# Patient Record
Sex: Female | Born: 1940 | Race: White | Hispanic: No | Marital: Married | Smoking: Never smoker
Health system: Southern US, Community
[De-identification: ages and names within clinical notes are randomized; demographics above are authoritative.]

## PROBLEM LIST (undated history)

## (undated) DIAGNOSIS — F329 Major depressive disorder, single episode, unspecified: Secondary | ICD-10-CM

## (undated) DIAGNOSIS — R5382 Chronic fatigue, unspecified: Secondary | ICD-10-CM

## (undated) DIAGNOSIS — G9332 Myalgic encephalomyelitis/chronic fatigue syndrome: Secondary | ICD-10-CM

## (undated) DIAGNOSIS — G4700 Insomnia, unspecified: Secondary | ICD-10-CM

## (undated) DIAGNOSIS — F32A Depression, unspecified: Secondary | ICD-10-CM

## (undated) DIAGNOSIS — N189 Chronic kidney disease, unspecified: Secondary | ICD-10-CM

## (undated) DIAGNOSIS — M1990 Unspecified osteoarthritis, unspecified site: Secondary | ICD-10-CM

## (undated) DIAGNOSIS — M810 Age-related osteoporosis without current pathological fracture: Secondary | ICD-10-CM

## (undated) DIAGNOSIS — M797 Fibromyalgia: Secondary | ICD-10-CM

## (undated) HISTORY — PX: BUNIONECTOMY: SHX129

## (undated) HISTORY — PX: TONSILLECTOMY: SUR1361

## (undated) HISTORY — DX: Depression, unspecified: F32.A

## (undated) HISTORY — PX: HERNIA REPAIR: SHX51

## (undated) HISTORY — DX: Myalgic encephalomyelitis/chronic fatigue syndrome: G93.32

## (undated) HISTORY — DX: Major depressive disorder, single episode, unspecified: F32.9

## (undated) HISTORY — DX: Unspecified osteoarthritis, unspecified site: M19.90

## (undated) HISTORY — DX: Fibromyalgia: R53.82

## (undated) HISTORY — DX: Insomnia, unspecified: G47.00

## (undated) HISTORY — DX: Chronic kidney disease, unspecified: N18.9

## (undated) HISTORY — DX: Myalgic encephalomyelitis/chronic fatigue syndrome: M79.7

## (undated) HISTORY — DX: Age-related osteoporosis without current pathological fracture: M81.0

---

## 1997-08-14 ENCOUNTER — Ambulatory Visit (HOSPITAL_COMMUNITY): Admission: RE | Admit: 1997-08-14 | Discharge: 1997-08-14 | Payer: Self-pay | Admitting: Obstetrics and Gynecology

## 1998-08-10 ENCOUNTER — Other Ambulatory Visit: Admission: RE | Admit: 1998-08-10 | Discharge: 1998-08-10 | Payer: Self-pay | Admitting: Obstetrics and Gynecology

## 1998-09-13 ENCOUNTER — Ambulatory Visit (HOSPITAL_COMMUNITY): Admission: RE | Admit: 1998-09-13 | Discharge: 1998-09-13 | Payer: Self-pay | Admitting: Obstetrics and Gynecology

## 1998-09-13 ENCOUNTER — Encounter: Payer: Self-pay | Admitting: Obstetrics and Gynecology

## 1999-10-10 ENCOUNTER — Encounter: Payer: Self-pay | Admitting: Obstetrics and Gynecology

## 1999-10-10 ENCOUNTER — Ambulatory Visit (HOSPITAL_COMMUNITY): Admission: RE | Admit: 1999-10-10 | Discharge: 1999-10-10 | Payer: Self-pay | Admitting: Obstetrics and Gynecology

## 2000-08-01 ENCOUNTER — Other Ambulatory Visit: Admission: RE | Admit: 2000-08-01 | Discharge: 2000-08-01 | Payer: Self-pay | Admitting: Obstetrics and Gynecology

## 2001-05-06 ENCOUNTER — Encounter: Payer: Self-pay | Admitting: Obstetrics and Gynecology

## 2001-05-06 ENCOUNTER — Ambulatory Visit (HOSPITAL_COMMUNITY): Admission: RE | Admit: 2001-05-06 | Discharge: 2001-05-06 | Payer: Self-pay | Admitting: Obstetrics and Gynecology

## 2002-09-26 ENCOUNTER — Ambulatory Visit (HOSPITAL_COMMUNITY): Admission: RE | Admit: 2002-09-26 | Discharge: 2002-09-26 | Payer: Self-pay | Admitting: Internal Medicine

## 2002-09-26 ENCOUNTER — Encounter: Payer: Self-pay | Admitting: Internal Medicine

## 2003-11-26 ENCOUNTER — Ambulatory Visit (HOSPITAL_COMMUNITY): Admission: RE | Admit: 2003-11-26 | Discharge: 2003-11-26 | Payer: Self-pay | Admitting: Obstetrics and Gynecology

## 2004-05-17 ENCOUNTER — Ambulatory Visit: Payer: Self-pay | Admitting: Internal Medicine

## 2004-06-17 ENCOUNTER — Ambulatory Visit: Payer: Self-pay | Admitting: Internal Medicine

## 2004-07-29 ENCOUNTER — Ambulatory Visit: Payer: Self-pay | Admitting: Internal Medicine

## 2004-09-15 ENCOUNTER — Ambulatory Visit: Payer: Self-pay | Admitting: Internal Medicine

## 2004-12-26 ENCOUNTER — Ambulatory Visit: Payer: Self-pay | Admitting: Internal Medicine

## 2005-01-17 ENCOUNTER — Ambulatory Visit: Payer: Self-pay | Admitting: Internal Medicine

## 2005-01-19 ENCOUNTER — Ambulatory Visit: Payer: Self-pay | Admitting: Internal Medicine

## 2005-01-23 ENCOUNTER — Ambulatory Visit: Payer: Self-pay | Admitting: Internal Medicine

## 2005-02-06 ENCOUNTER — Ambulatory Visit: Payer: Self-pay | Admitting: Internal Medicine

## 2005-02-06 ENCOUNTER — Ambulatory Visit (HOSPITAL_COMMUNITY): Admission: RE | Admit: 2005-02-06 | Discharge: 2005-02-06 | Payer: Self-pay | Admitting: Obstetrics and Gynecology

## 2005-02-17 ENCOUNTER — Ambulatory Visit: Payer: Self-pay | Admitting: Internal Medicine

## 2005-03-02 ENCOUNTER — Other Ambulatory Visit: Admission: RE | Admit: 2005-03-02 | Discharge: 2005-03-02 | Payer: Self-pay | Admitting: Obstetrics and Gynecology

## 2005-04-03 ENCOUNTER — Ambulatory Visit: Payer: Self-pay | Admitting: Internal Medicine

## 2005-11-22 ENCOUNTER — Ambulatory Visit: Payer: Self-pay | Admitting: Internal Medicine

## 2006-01-04 ENCOUNTER — Ambulatory Visit: Payer: Self-pay | Admitting: Internal Medicine

## 2006-03-23 ENCOUNTER — Other Ambulatory Visit: Admission: RE | Admit: 2006-03-23 | Discharge: 2006-03-23 | Payer: Self-pay | Admitting: Obstetrics and Gynecology

## 2006-04-12 ENCOUNTER — Ambulatory Visit (HOSPITAL_COMMUNITY): Admission: RE | Admit: 2006-04-12 | Discharge: 2006-04-12 | Payer: Self-pay | Admitting: Obstetrics and Gynecology

## 2006-04-12 ENCOUNTER — Ambulatory Visit: Payer: Self-pay | Admitting: Internal Medicine

## 2006-04-12 LAB — CONVERTED CEMR LAB
Albumin: 3.8 g/dL (ref 3.5–5.2)
Basophils Relative: 0.1 % (ref 0.0–1.0)
CO2: 28 meq/L (ref 19–32)
Chol/HDL Ratio, serum: 3.4
Creatinine, Ser: 1.1 mg/dL (ref 0.4–1.2)
Eosinophil percent: 2.9 % (ref 0.0–5.0)
GFR calc non Af Amer: 53 mL/min
Glomerular Filtration Rate, Af Am: 64 mL/min/{1.73_m2}
Glucose, Bld: 79 mg/dL (ref 70–99)
HDL: 70 mg/dL (ref >39.0)
Hemoglobin: 14.4 g/dL (ref 12.0–15.0)
MCHC: 33.8 g/dL (ref 30.0–36.0)
Monocytes Absolute: 0.3 10*3/uL (ref 0.2–0.7)
Monocytes Relative: 6.5 % (ref 3.0–11.0)
Potassium: 4.6 meq/L (ref 3.5–5.1)
RBC: 4.4 M/uL (ref 3.87–5.11)
RDW: 12.9 % (ref 11.5–14.6)
Sodium: 143 meq/L (ref 135–145)
TSH: 2.52 microintl units/mL (ref 0.35–5.50)
Total Bilirubin: 1 mg/dL (ref 0.3–1.2)
Triglyceride fasting, serum: 64 mg/dL (ref 0–149)
VLDL: 13 mg/dL (ref 0–40)

## 2006-05-08 ENCOUNTER — Ambulatory Visit: Payer: Self-pay | Admitting: Internal Medicine

## 2006-07-12 ENCOUNTER — Ambulatory Visit: Payer: Self-pay | Admitting: Internal Medicine

## 2006-08-27 ENCOUNTER — Ambulatory Visit: Payer: Self-pay | Admitting: Internal Medicine

## 2006-11-19 ENCOUNTER — Ambulatory Visit (HOSPITAL_COMMUNITY): Admission: RE | Admit: 2006-11-19 | Discharge: 2006-11-19 | Payer: Self-pay | Admitting: Gastroenterology

## 2007-03-12 DIAGNOSIS — F32A Depression, unspecified: Secondary | ICD-10-CM | POA: Insufficient documentation

## 2007-03-12 DIAGNOSIS — F329 Major depressive disorder, single episode, unspecified: Secondary | ICD-10-CM | POA: Insufficient documentation

## 2007-08-14 ENCOUNTER — Ambulatory Visit (HOSPITAL_COMMUNITY): Admission: RE | Admit: 2007-08-14 | Discharge: 2007-08-14 | Payer: Self-pay | Admitting: Surgery

## 2007-11-05 ENCOUNTER — Other Ambulatory Visit: Admission: RE | Admit: 2007-11-05 | Discharge: 2007-11-05 | Payer: Self-pay | Admitting: Family Medicine

## 2008-04-07 ENCOUNTER — Ambulatory Visit: Payer: Self-pay | Admitting: Internal Medicine

## 2009-05-07 ENCOUNTER — Ambulatory Visit: Payer: Self-pay | Admitting: Internal Medicine

## 2010-03-17 ENCOUNTER — Ambulatory Visit: Payer: Self-pay | Admitting: Internal Medicine

## 2010-08-04 NOTE — Assessment & Plan Note (Signed)
Summary: FLU SHOT/CB  Nurse Visit   Review of Systems       Flu Vaccine Consent Questions     Do you have a history of severe allergic reactions to this vaccine? no    Any prior history of allergic reactions to egg and/or gelatin? no    Do you have a sensitivity to the preservative Thimersol? no    Do you have a past history of Guillan-Barre Syndrome? no    Do you currently have an acute febrile illness? no    Have you ever had a severe reaction to latex? no    Vaccine information given and explained to patient? yes    Are you currently pregnant? no    Lot Number:[id number]   Exp Date:12/31/2010   Site Given  Left Deltoid IM Josph Macho RMA  March 17, 2010 3:46 PM    Allergies: No Known Drug Allergies  Orders Added: 1)  Flu Vaccine 95yrs + MEDICARE PATIENTS [Q2039] 2)  Administration Flu vaccine - MCR [G0008]

## 2010-08-16 ENCOUNTER — Encounter: Payer: Self-pay | Admitting: Internal Medicine

## 2010-08-16 ENCOUNTER — Ambulatory Visit (INDEPENDENT_AMBULATORY_CARE_PROVIDER_SITE_OTHER): Payer: PRIVATE HEALTH INSURANCE | Admitting: Internal Medicine

## 2010-08-16 DIAGNOSIS — F329 Major depressive disorder, single episode, unspecified: Secondary | ICD-10-CM

## 2010-08-16 DIAGNOSIS — M5412 Radiculopathy, cervical region: Secondary | ICD-10-CM

## 2010-08-16 MED ORDER — METHYLPREDNISOLONE ACETATE 80 MG/ML IJ SUSP
80.0000 mg | Freq: Once | INTRAMUSCULAR | Status: AC
  Administered 2010-08-16: 80 mg via INTRAMUSCULAR

## 2010-08-16 NOTE — Progress Notes (Signed)
  Subjective:    Patient ID: Dawn Harmon, female    DOB: [birth year], 70 y.o.   MRN: [id number]  HPI  70 year old patient who presents with a 3-day history of right shoulder discomfort.  There's been no trauma or injury.  No constitutional symptoms.  She has also noticed some paresthesias involving the right arm affecting the third, fourth, and fifth fingers of the right hand.  She states that when she was young.  She did have a right shoulder dislocation.  Symptoms do not seem to be aggravated by movement of the head or neck    Review of Systems  Constitutional: Negative.   HENT: Negative for hearing loss, congestion, sore throat, rhinorrhea, dental problem, sinus pressure and tinnitus.   Eyes: Negative for pain, discharge and visual disturbance.  Respiratory: Negative for cough and shortness of breath.   Cardiovascular: Negative for chest pain, palpitations and leg swelling.  Gastrointestinal: Negative for nausea, vomiting, abdominal pain, diarrhea, constipation, blood in stool and abdominal distention.  Genitourinary: Negative for dysuria, urgency, frequency, hematuria, flank pain, vaginal bleeding, vaginal discharge, difficulty urinating, vaginal pain and pelvic pain.  Musculoskeletal: Negative for joint swelling, arthralgias and gait problem.  Skin: Negative for rash.  Neurological: Negative for dizziness, syncope, speech difficulty, weakness, numbness and headaches.  Hematological: Negative for adenopathy.  Psychiatric/Behavioral: Negative for behavioral problems, dysphoric mood and agitation. The patient is not nervous/anxious.        Objective:   Physical Exam  Constitutional: She is oriented to person, place, and time. She appears well-developed and well-nourished. No distress.  Musculoskeletal: Normal range of motion. She exhibits no edema and no tenderness.       Range of motion of the right shoulder and neck intact.  Maneuvers do not tend to aggravate pain or paresthesias    Neurological: She is alert and oriented to person, place, and time. She has normal reflexes. She displays normal reflexes. No cranial nerve deficit. Coordination normal.       Biceps and biceps reflexes were normal Motor strength intact Left grip strength is  slightly stronger ( dominant hand)          Assessment & Plan:   probable mild cervical radiculopathy.  Normal neurologic examination.  Will treat with 80 mg of Depo-Medrol and observed Impression stable will continue present regimen

## 2010-08-16 NOTE — Patient Instructions (Signed)
Call or return to clinic prn if these symptoms worsen or fail to improve as anticipated.

## 2010-11-15 NOTE — Op Note (Signed)
NAMERAVEN, FURNAS                  ACCOUNT NO.:  [id number]   MEDICAL RECORD NO.:  [id number]          PATIENT TYPE:  AMB   LOCATION:  DAY                          FACILITY:  [facility]   PHYSICIAN:  Thornton Park. Daphine Deutscher, MD  DATE OF BIRTH:  May 27, 1941   DATE OF PROCEDURE:  08/14/2007  DATE OF DISCHARGE:                               OPERATIVE REPORT   PREOPERATIVE DIAGNOSIS:  Left inguinal hernia.   POSTOPERATIVE DIAGNOSIS:  Left indirect inguinal hernia.   PROCEDURE:  Left inguinal herniorrhaphy with mesh, open.   SURGEON:  Thornton Park. Daphine Deutscher, M.D.   ASSISTANT:  None.   ANESTHESIA:  General.   DESCRIPTION OF PROCEDURE:  Ms. Welling was taken to room 1 at [facility]  on August 14, 2007, and given general anesthesia.  The abdomen was  prepped with TechniCare and draped sterilely.  I had previously marked  the area on the left side in the upright position and I made a  transverse incision over this area where I thought the internal ring  was.  I carried this down opening the external oblique and mobilized  about a 3-4 cm in diameter hernia sac.  This was then reduced into the  abdomen after feeling of it carefully and then felt like I did not want  to excise it since it may have sliding components.  I closed the  external oblique with interrupted 2-0 Prolenes and reinforced this area  with a piece of Marlex mesh cut and then sewn in with interrupted  sutures.  The gubernaculum had been transected.  The external oblique  was then closed over this and then the skin was closed 4-0 Vicryl,  Benzoin, and Steri-Strips.  The patient tolerated the procedure well.  She will be given Vicodin to take for pain and be followed up in the  office in 3-4 weeks.      Thornton Park Daphine Deutscher, MD  Electronically Signed     MBM/MEDQ  D:  08/14/2007  T:  08/15/2007  Job:  [id number]   cc:   Stacie Acres. Cliffton Asters, M.D.  Fax: [phone]   Courtney Paris, M.D.  Fax: [phone]

## 2010-11-18 NOTE — Assessment & Plan Note (Signed)
[facility] OFFICE NOTE   LENZI, MARMO                         MRN:          [id number]  DATE:05/08/2006                            DOB:          1940-07-17    A 70 year old female seen today for an annual exam.  She is followed by  gynecology.  She has a history of chronic fatigue and fibromyalgia.  Her  main complaint is chronic insomnia.  She is gravida 2, para 2, abortus 1,  has had a remote tonsillectomy.  Presently on no medications.   FAMILY HISTORY:  Father died at 65 with a history of depression.  Mother  died at 53 of breast cancer, a sister also has breast cancer.   PHYSICAL EXAMINATION:  GENERAL:  Reveals a healthy-appearing fit female in  no acute distress.  VITAL SIGNS:  Blood pressure was low normal.  SKIN:  Negative.  FUNDI, EARS, NOSE AND THROAT:  Clear.  NECK:  No adenopathy or bruits, no thyroid enlargement.  CHEST:  Clear.  BREASTS:  Negative.  CARDIOVASCULAR:  Normal heart sounds without murmurs or clicks.  ABDOMEN:  Soft and nontender, no organomegaly or bruits.  EXTREMITIES:  Unremarkable.  Peripheral pulses intact.  NEUROLOGIC:  Negative.   IMPRESSION:  1. Chronic fatigue.  2. Fibromyalgia.  3. Insomnia.   DISPOSITION:  We will give her a trial of Rozerem, additionally was given a  prescription for Lunesta which has been helpful in the past.  Return here in  1 year or p.r.n.    ______________________________  Gordy Savers, MD    PFK/MedQ  DD: 05/08/2006  DT: 05/09/2006  Job #: [id number]

## 2010-11-18 NOTE — Assessment & Plan Note (Signed)
[facility]                                 ON-CALL NOTE   JALENE, DEMO                           MRN:          [id number]  DATE:08/25/2006                            DOB:          [birth year]    PHONE NUMBER: [phone].:  Called at 1:55 p.m. with a possible bladder infection.  Because we were  already overbooked and not able to fit her in at the Saturday clinic she  was advised to go to [facility] for further evaluation.     Karie Schwalbe, MD  Electronically Signed    RIL/MedQ  DD: 08/25/2006  DT: 08/25/2006  Job #: [id number]   cc:   Gordy Savers, MD

## 2010-12-29 ENCOUNTER — Telehealth: Payer: Self-pay | Admitting: Internal Medicine

## 2010-12-29 NOTE — Telephone Encounter (Signed)
Still having right arm pain/numbness. Went to ortho dr last month tests were done and that it was arthritis. Still hurting and wants to know what the next step is. Refused several times to come see Dr Kirtland Bouchard. Please advise.

## 2010-12-29 NOTE — Telephone Encounter (Signed)
Attempt to call - ans mach - LMTCB - informed of dr. Vernon Prey instruction to make appt here or with ortho. KIK

## 2010-12-29 NOTE — Telephone Encounter (Signed)
Suggest office follow up here or followup with orthopedics

## 2011-01-16 ENCOUNTER — Encounter: Payer: Self-pay | Admitting: Internal Medicine

## 2011-01-16 ENCOUNTER — Ambulatory Visit (INDEPENDENT_AMBULATORY_CARE_PROVIDER_SITE_OTHER): Payer: PRIVATE HEALTH INSURANCE | Admitting: Internal Medicine

## 2011-01-16 VITALS — BP 118/87 | Temp 97.9°F | Wt 157.0 lb

## 2011-01-16 DIAGNOSIS — F329 Major depressive disorder, single episode, unspecified: Secondary | ICD-10-CM

## 2011-01-16 DIAGNOSIS — N390 Urinary tract infection, site not specified: Secondary | ICD-10-CM

## 2011-01-16 LAB — POCT URINALYSIS DIPSTICK
Ketones, UA: NEGATIVE
Spec Grav, UA: 1.005
Urobilinogen, UA: 1

## 2011-01-16 MED ORDER — CIPROFLOXACIN HCL 500 MG PO TABS: 500.0000 mg | ORAL_TABLET | Freq: Two times a day (BID) | ORAL | Status: DC

## 2011-01-16 NOTE — Patient Instructions (Signed)
Force fluids Call or return to clinic prn if these symptoms worsen or fail to improve as anticipated.

## 2011-01-16 NOTE — Progress Notes (Signed)
  Subjective:    Patient ID: Dawn Harmon, female    DOB: April 06, 1941, 70 y.o.   MRN: [id number]  HPI 70 year old patient who presents with an abrupt onset over the past few hours of burning dysuria frequency and urgency. Urinalysis was reviewed and did reveal pyuria. No fever or chills no flank pain. Denies any drug allergies   Review of Systems  Genitourinary: Positive for dysuria and frequency.       Objective:   Physical Exam  Constitutional: She appears well-developed and well-nourished. No distress.          Assessment & Plan:  Acute cystitis. Will treat with Cipro

## 2011-01-19 ENCOUNTER — Encounter: Payer: Self-pay | Admitting: Internal Medicine

## 2011-01-19 ENCOUNTER — Ambulatory Visit (INDEPENDENT_AMBULATORY_CARE_PROVIDER_SITE_OTHER): Payer: PRIVATE HEALTH INSURANCE | Admitting: Internal Medicine

## 2011-01-19 VITALS — BP 110/76 | Temp 97.8°F | Wt 158.0 lb

## 2011-01-19 DIAGNOSIS — N390 Urinary tract infection, site not specified: Secondary | ICD-10-CM

## 2011-01-19 DIAGNOSIS — R350 Frequency of micturition: Secondary | ICD-10-CM

## 2011-01-19 LAB — POCT URINALYSIS DIPSTICK
Leukocytes, UA: NEGATIVE
Nitrite, UA: POSITIVE
Protein, UA: NEGATIVE
pH, UA: 5.5

## 2011-01-19 MED ORDER — NITROFURANTOIN MONOHYD MACRO 100 MG PO CAPS: 100.0000 mg | ORAL_CAPSULE | Freq: Two times a day (BID) | ORAL | Status: AC

## 2011-01-19 NOTE — Progress Notes (Signed)
  Subjective:    Patient ID: Dawn Harmon, female    DOB: Nov 27, 1940, 70 y.o.   MRN: [id number]  HPI  70 year old patient who was seen 4 days ago and treated for a UTI after the abrupt onset of urinary frequency and dysuria. She was placed on Cipro and last night developed urinary frequency and today some urgency and dysuria. She remains on Cipro twice daily. She has been taking Pyridium and symptoms are greatly improved no fever chills or flank pain     Review of Systems  Genitourinary: Positive for dysuria and frequency.       Objective:   Physical Exam  Constitutional: She appears well-developed and well-nourished. No distress.  Abdominal: Soft. Bowel sounds are normal.          Assessment & Plan:  Refractory UTI. Will treat with Macrobid 100 twice a day for 7 days. If symptoms do not improve we'll check a urine culture

## 2011-01-19 NOTE — Patient Instructions (Signed)
Drink as much fluid as you  can tolerate over the next few days  Discontinue Cipro  Take your antibiotic as prescribed until ALL of it is gone, but stop if you develop a rash, swelling, or any side effects of the medication.  Contact our office as soon as possible if  there are side effects of the medication.

## 2011-02-28 ENCOUNTER — Other Ambulatory Visit (INDEPENDENT_AMBULATORY_CARE_PROVIDER_SITE_OTHER): Payer: PRIVATE HEALTH INSURANCE

## 2011-02-28 DIAGNOSIS — Z79899 Other long term (current) drug therapy: Secondary | ICD-10-CM

## 2011-02-28 DIAGNOSIS — Z0000 Encounter for general adult medical examination without abnormal findings: Secondary | ICD-10-CM

## 2011-02-28 DIAGNOSIS — F329 Major depressive disorder, single episode, unspecified: Secondary | ICD-10-CM

## 2011-02-28 LAB — POCT URINALYSIS DIPSTICK
Bilirubin, UA: NEGATIVE
Nitrite, UA: NEGATIVE
Protein, UA: NEGATIVE
pH, UA: 7

## 2011-02-28 LAB — CBC WITH DIFFERENTIAL/PLATELET
Basophils Relative: 0.6 % (ref 0.0–3.0)
Eosinophils Absolute: 0.1 10*3/uL (ref 0.0–0.7)
Eosinophils Relative: 1.3 % (ref 0.0–5.0)
Hemoglobin: 13.9 g/dL (ref 12.0–15.0)
Lymphocytes Relative: 33.3 % (ref 12.0–46.0)
MCHC: 32.9 g/dL (ref 30.0–36.0)
Monocytes Relative: 7.1 % (ref 3.0–12.0)
Neutrophils Relative %: 57.7 % (ref 43.0–77.0)
RBC: 4.36 Mil/uL (ref 3.87–5.11)
WBC: 4 10*3/uL — ABNORMAL LOW (ref 4.5–10.5)

## 2011-02-28 LAB — TSH: TSH: 1.22 u[IU]/mL (ref 0.35–5.50)

## 2011-02-28 LAB — HEPATIC FUNCTION PANEL
AST: 19 U/L (ref 0–37)
Alkaline Phosphatase: 51 U/L (ref 39–117)
Bilirubin, Direct: 0.1 mg/dL (ref 0.0–0.3)
Total Protein: 6.8 g/dL (ref 6.0–8.3)

## 2011-02-28 LAB — BASIC METABOLIC PANEL
Calcium: 9 mg/dL (ref 8.4–10.5)
Creatinine, Ser: 1.1 mg/dL (ref 0.4–1.2)
GFR: 55.06 mL/min — ABNORMAL LOW (ref >60.00)
Sodium: 141 mEq/L (ref 135–145)

## 2011-02-28 LAB — LIPID PANEL: HDL: 89.6 mg/dL (ref >39.00)

## 2011-03-10 ENCOUNTER — Ambulatory Visit (INDEPENDENT_AMBULATORY_CARE_PROVIDER_SITE_OTHER): Payer: PRIVATE HEALTH INSURANCE | Admitting: Internal Medicine

## 2011-03-10 ENCOUNTER — Encounter: Payer: Self-pay | Admitting: Internal Medicine

## 2011-03-10 VITALS — BP 110/72 | HR 76 | Temp 97.7°F | Resp 18 | Ht 65.0 in | Wt 155.0 lb

## 2011-03-10 DIAGNOSIS — M5412 Radiculopathy, cervical region: Secondary | ICD-10-CM

## 2011-03-10 DIAGNOSIS — Z0000 Encounter for general adult medical examination without abnormal findings: Secondary | ICD-10-CM

## 2011-03-10 DIAGNOSIS — Z136 Encounter for screening for cardiovascular disorders: Secondary | ICD-10-CM

## 2011-03-10 DIAGNOSIS — F329 Major depressive disorder, single episode, unspecified: Secondary | ICD-10-CM

## 2011-03-10 MED ORDER — BUPROPION HCL ER (SR) 150 MG PO TB12: 150.0000 mg | ORAL_TABLET | Freq: Every day | ORAL | Status: DC

## 2011-03-10 NOTE — Patient Instructions (Signed)
Limit your sodium (Salt) intake    It is important that you exercise regularly, at least 20 minutes 3 to 4 times per week.  If you develop chest pain or shortness of breath seek  medical attention.  Suggest psychiatric consultation

## 2011-03-10 NOTE — Progress Notes (Signed)
Subjective:    Patient ID: Dawn Harmon, female    DOB: February 05, 1941, 70 y.o.   MRN: [id number]  HPI 70 year old patient who is seen today for a wellness exam.  She has been followed by gynecology through the years. She has a history of chronic fatigue and fibromyalgia. She has had some insomnia issues in the past. Complaints today include worsening depression. Since June she has had worsening weakness fatigue generalized anxiety and aching she describes nausea and decreased concentration. She has seen GI (Dr. Loreta Ave) due to the nausea. She has had a colonoscopy approximately 5 years ago. Approximately 2 weeks ago she resumed bupropion sustained release 150 mg. She has tried multiple drugs in the past with significant side effects. She has had psychiatric evaluation in the past. Her last gynecologic exam was approximately one year ago. She has had a recent eye exam She continues to have numbness involving the right arm she has seen orthopedics and chiropractor. She wishes to pursue further evaluation with a MRI. She apparently had nerve conduction and EMG is performed as well by orthopedics;  it was felt in February of this year she had a mild cervical radiculopathy  Past medical history she is a gravida 3 para 3 abortus 0. She had  a remote tonsillectomy History of fibromyalgia and chronic fatigue History depression  Family history. Father died at age 73 tissue depression Mother died age 83 breast cancer. One sister also had breast cancer  1. Risk factors, based on past  M,S,F history-  no significant cardiovascular risk factors  2.  Physical activities: Remains fairly sedentary due to weakness and chronic fatigue. She and her husband have traveled little over the past 2 years due to weakness  3.  Depression/mood: History of major depression has had psychiatric evaluation in the past and has been on multiple medications. For the past 2 weeks has resumed bupropion  4.  Hearing: No deficits  5.   ADL's:  independent in all aspects of daily living  6.  Fall risk: Low 7.  Home safety: No problems identified  8.  Height weight, and visual acuity; height and weight stable no change in visual acuity  9.  Counseling: Regular exercise encouraged she was encouraged to consider psychiatric followup  10. Lab orders based on risk factors: Laboratory studies were reviewed  11. Referral will set up for a  cervical MRI :  12. Care plan: Cervical MRI. Greater exercise heart healthy diet encouraged 13. Cognitive assessment: Alert and oriented normal affect        Review of Systems  Constitutional: Positive for fatigue. Negative for fever, appetite change and unexpected weight change.  HENT: Negative for hearing loss, ear pain, nosebleeds, congestion, sore throat, mouth sores, trouble swallowing, neck stiffness, dental problem, voice change, sinus pressure and tinnitus.   Eyes: Negative for photophobia, pain, redness and visual disturbance.  Respiratory: Negative for cough, chest tightness and shortness of breath.   Cardiovascular: Negative for chest pain, palpitations and leg swelling.  Gastrointestinal: Negative for nausea, vomiting, abdominal pain, diarrhea, constipation, blood in stool, abdominal distention and rectal pain.  Genitourinary: Negative for dysuria, urgency, frequency, hematuria, flank pain, vaginal bleeding, vaginal discharge, difficulty urinating, genital sores, vaginal pain, menstrual problem and pelvic pain.  Musculoskeletal: Negative for back pain and arthralgias.  Skin: Negative for rash.  Neurological: Positive for weakness and numbness. Negative for dizziness, seizures, syncope, speech difficulty, light-headedness and headaches.       Numbness of the right arm in  the ulnar distribution  Hematological: Negative for adenopathy. Does not bruise/bleed easily.  Psychiatric/Behavioral: Positive for sleep disturbance, dysphoric mood and decreased concentration. Negative for  suicidal ideas, behavioral problems, self-injury and agitation. The patient is nervous/anxious.        Objective:   Physical Exam  Constitutional: She is oriented to person, place, and time. She appears well-developed and well-nourished.  HENT:  Head: Normocephalic and atraumatic.  Right Ear: External ear normal.  Left Ear: External ear normal.  Mouth/Throat: Oropharynx is clear and moist.  Eyes: Conjunctivae and EOM are normal.  Neck: Normal range of motion. Neck supple. No JVD present. No thyromegaly present.  Cardiovascular: Normal rate, regular rhythm, normal heart sounds and intact distal pulses.   No murmur heard. Pulmonary/Chest: Effort normal and breath sounds normal. She has no wheezes. She has no rales.  Abdominal: Soft. Bowel sounds are normal. She exhibits no distension and no mass. There is no tenderness. There is no rebound and no guarding.  Musculoskeletal: Normal range of motion. She exhibits no edema and no tenderness.  Neurological: She is alert and oriented to person, place, and time. She has normal reflexes. No cranial nerve deficit. She exhibits normal muscle tone. Coordination normal.  Skin: Skin is warm and dry. No rash noted.  Psychiatric: She has a normal mood and affect. Her behavior is normal.          Assessment & Plan:   Preventive health examination Probable mild cervical radiculopathy. Options were discussed. The patient wishes to proceed with a cervical MRI Depression with chronic fatigue and possible fibromyalgia. The patient has resumed bupropion will continue psychiatric consultation also encouraged

## 2011-03-16 ENCOUNTER — Ambulatory Visit
Admission: RE | Admit: 2011-03-16 | Discharge: 2011-03-16 | Disposition: A | Discharge status: Home / Self Care | Payer: PRIVATE HEALTH INSURANCE | Source: Ambulatory Visit | Attending: Internal Medicine | Admitting: Internal Medicine

## 2011-03-16 DIAGNOSIS — M5412 Radiculopathy, cervical region: Secondary | ICD-10-CM

## 2011-03-16 NOTE — Progress Notes (Signed)
Quick Note:  done ______ 

## 2011-03-24 LAB — BASIC METABOLIC PANEL
BUN: 9
Calcium: 9.7
GFR calc non Af Amer: 50 — ABNORMAL LOW
Glucose, Bld: 102 — ABNORMAL HIGH
Sodium: 143

## 2011-04-12 ENCOUNTER — Telehealth: Payer: Self-pay | Admitting: Internal Medicine

## 2011-04-12 NOTE — Telephone Encounter (Signed)
Pt. Is having severe nerve pain in shoulder and wanted to know if something could be called in. May even call in tomorrow if she needs to be seen. PLEASE call cell in no answer on home phone [phone]. Pharmacy is [facility] on [address]

## 2011-04-13 MED ORDER — TRAMADOL HCL 50 MG PO TABS: 50.0000 mg | ORAL_TABLET | Freq: Four times a day (QID) | ORAL | Status: AC | PRN

## 2011-04-13 NOTE — Telephone Encounter (Signed)
Tramadol 50 mg #50 one every 6 hours as needed for pain;  suggest Aleve 2 twice daily

## 2011-04-13 NOTE — Telephone Encounter (Signed)
Pt aware rx to [facility]

## 2011-04-13 NOTE — Telephone Encounter (Signed)
Please advise 

## 2011-10-20 ENCOUNTER — Other Ambulatory Visit (HOSPITAL_COMMUNITY)
Admission: RE | Admit: 2011-10-20 | Discharge: 2011-10-20 | Disposition: A | Discharge status: Home / Self Care | Payer: PRIVATE HEALTH INSURANCE | Source: Ambulatory Visit | Attending: Family Medicine | Admitting: Family Medicine

## 2011-10-20 ENCOUNTER — Other Ambulatory Visit: Payer: Self-pay | Admitting: Family Medicine

## 2011-10-20 DIAGNOSIS — Z124 Encounter for screening for malignant neoplasm of cervix: Secondary | ICD-10-CM | POA: Insufficient documentation

## 2012-01-02 ENCOUNTER — Other Ambulatory Visit (HOSPITAL_COMMUNITY): Payer: Self-pay | Admitting: Family Medicine

## 2012-01-02 DIAGNOSIS — Z1231 Encounter for screening mammogram for malignant neoplasm of breast: Secondary | ICD-10-CM

## 2012-01-25 ENCOUNTER — Ambulatory Visit (HOSPITAL_COMMUNITY)
Admission: RE | Admit: 2012-01-25 | Discharge: 2012-01-25 | Disposition: A | Discharge status: Home / Self Care | Payer: PRIVATE HEALTH INSURANCE | Source: Ambulatory Visit | Attending: Family Medicine | Admitting: Family Medicine

## 2012-01-25 ENCOUNTER — Ambulatory Visit (HOSPITAL_COMMUNITY): Payer: PRIVATE HEALTH INSURANCE

## 2012-01-25 DIAGNOSIS — Z1231 Encounter for screening mammogram for malignant neoplasm of breast: Secondary | ICD-10-CM

## 2012-02-08 ENCOUNTER — Ambulatory Visit
Admission: RE | Admit: 2012-02-08 | Discharge: 2012-02-08 | Disposition: A | Discharge status: Home / Self Care | Payer: Medicare Other | Source: Ambulatory Visit | Attending: Family Medicine | Admitting: Family Medicine

## 2012-02-08 ENCOUNTER — Other Ambulatory Visit: Payer: Self-pay | Admitting: Family Medicine

## 2012-02-08 DIAGNOSIS — R059 Cough, unspecified: Secondary | ICD-10-CM

## 2012-02-08 DIAGNOSIS — R05 Cough: Secondary | ICD-10-CM

## 2012-02-27 DIAGNOSIS — N390 Urinary tract infection, site not specified: Secondary | ICD-10-CM | POA: Insufficient documentation

## 2012-03-07 ENCOUNTER — Other Ambulatory Visit: Payer: Self-pay | Admitting: Family Medicine

## 2012-03-07 ENCOUNTER — Ambulatory Visit
Admission: RE | Admit: 2012-03-07 | Discharge: 2012-03-07 | Disposition: A | Discharge status: Home / Self Care | Payer: Medicare Other | Source: Ambulatory Visit | Attending: Family Medicine | Admitting: Family Medicine

## 2012-03-07 DIAGNOSIS — R918 Other nonspecific abnormal finding of lung field: Secondary | ICD-10-CM

## 2012-04-04 ENCOUNTER — Ambulatory Visit (INDEPENDENT_AMBULATORY_CARE_PROVIDER_SITE_OTHER): Payer: Medicare Other | Admitting: Internal Medicine

## 2012-04-04 DIAGNOSIS — Z23 Encounter for immunization: Secondary | ICD-10-CM

## 2012-05-22 DIAGNOSIS — N302 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: Secondary | ICD-10-CM | POA: Insufficient documentation

## 2013-04-11 ENCOUNTER — Ambulatory Visit: Payer: Medicare Other

## 2013-04-16 ENCOUNTER — Ambulatory Visit (INDEPENDENT_AMBULATORY_CARE_PROVIDER_SITE_OTHER): Payer: Medicare Other

## 2013-04-16 DIAGNOSIS — Z23 Encounter for immunization: Secondary | ICD-10-CM

## 2013-07-25 ENCOUNTER — Ambulatory Visit (INDEPENDENT_AMBULATORY_CARE_PROVIDER_SITE_OTHER): Payer: Medicare HMO | Admitting: Family Medicine

## 2013-07-25 ENCOUNTER — Encounter: Payer: Self-pay | Admitting: Family Medicine

## 2013-07-25 VITALS — BP 120/82 | Temp 98.1°F | Wt 148.0 lb

## 2013-07-25 DIAGNOSIS — R300 Dysuria: Secondary | ICD-10-CM

## 2013-07-25 LAB — POCT URINALYSIS DIPSTICK
BILIRUBIN UA: NEGATIVE
GLUCOSE UA: NEGATIVE
Ketones, UA: NEGATIVE
NITRITE UA: POSITIVE
Spec Grav, UA: 1.02
UROBILINOGEN UA: 0.2
pH, UA: 7

## 2013-07-25 MED ORDER — NITROFURANTOIN MONOHYD MACRO 100 MG PO CAPS: 100.0000 mg | ORAL_CAPSULE | Freq: Two times a day (BID) | ORAL | Status: DC

## 2013-07-25 NOTE — Patient Instructions (Signed)
-  As we discussed, we have prescribed a new medication for you at this appointment. We discussed the common and serious potential adverse effects of this medication and you can review these and more with the pharmacist when you pick up your medication.  Please follow the instructions for use carefully and notify us immediately if you have any problems taking this medication.  -please follow up with your urologist as scheduled

## 2013-07-25 NOTE — Progress Notes (Signed)
Chief Complaint  Patient presents with  . Dysuria    HPI:  Acute visit for Dysuria: Started several days ago Symptoms: mild urinary frequency, urgency Hx of many UTIs in the past and sees Dr. Amalia Hailey at [facility] for this - she has an appointment with him next week Denies: fevers, nausea, flank pain, vomiting, hematuria  ROS: See pertinent positives and negatives per HPI.  No past medical history on file.  No past surgical history on file.  No family history on file.  History   Social History  . Marital Status: Married    Spouse Name: N/A    Number of Children: N/A  . Years of Education: N/A   Social History Main Topics  . Smoking status: Never Smoker   . Smokeless tobacco: None  . Alcohol Use: None  . Drug Use: None  . Sexual Activity: None   Other Topics Concern  . None   Social History Narrative  . None    Current outpatient prescriptions:phenazopyridine (PYRIDIUM) 200 MG tablet, Take 200 mg by mouth 3 (three) times daily as needed.  , Disp: , Rfl: ;  buPROPion (WELLBUTRIN SR) 150 MG 12 hr tablet, Take 1 tablet (150 mg total) by mouth daily., Disp: 90 tablet, Rfl: 4;  nitrofurantoin, macrocrystal-monohydrate, (MACROBID) 100 MG capsule, Take 1 capsule (100 mg total) by mouth 2 (two) times daily., Disp: 14 capsule, Rfl: 0  EXAM:  Filed Vitals:   07/25/13 1552  BP: 120/82  Temp: 98.1 F (36.7 C)    Body mass index is 24.63 kg/(m^2).  GENERAL: vitals reviewed and listed above, alert, oriented, appears well hydrated and in no acute distress  HEENT: atraumatic, conjunttiva clear, no obvious abnormalities on inspection of external nose and ears  NECK: no obvious masses on inspection  LUNGS: clear to auscultation bilaterally, no wheezes, rales or rhonchi, good air movement  CV: HRRR, no peripheral edema  ABD: soft, NTTP, no CVA TTP  MS: moves all extremities without noticeable abnormality  PSYCH: pleasant and cooperative, no obvious depression or  anxiety  ASSESSMENT AND PLAN:  Discussed the following assessment and plan:  Dysuria - Plan: POCT urinalysis dipstick, Urine culture, nitrofurantoin, macrocrystal-monohydrate, (MACROBID) 100 MG capsule  -Advised given dip indicative of UTI starting abx, she reports a hx of intolerances to many medications and a hx of having to change antibiotics due to bacterial resistance and prefers not to start an antibiotic until culture results -discussed risks of tx versus waiting and she will take printed rx to take if any worsening -culture pending -she has follow up with her urologist next week -Patient advised to return or notify a doctor immediately if symptoms worsen or persist or new concerns arise.  Patient Instructions  -As we discussed, we have prescribed a new medication for you at this appointment. We discussed the common and serious potential adverse effects of this medication and you can review these and more with the pharmacist when you pick up your medication.  Please follow the instructions for use carefully and notify us immediately if you have any problems taking this medication.  -please follow up with your urologist as scheduled     Colin Benton R.

## 2013-07-25 NOTE — Progress Notes (Signed)
Pre visit review using our clinic review tool, if applicable. No additional management support is needed unless otherwise documented below in the visit note. 

## 2013-07-28 ENCOUNTER — Telehealth: Payer: Self-pay | Admitting: Internal Medicine

## 2013-07-28 LAB — URINE CULTURE

## 2013-07-28 MED ORDER — SULFAMETHOXAZOLE-TMP DS 800-160 MG PO TABS: 1.0000 | ORAL_TABLET | Freq: Two times a day (BID) | ORAL | Status: DC

## 2013-07-28 NOTE — Telephone Encounter (Signed)
Patient Information:  Caller Name: Daksha  Phone: [phone]  Patient: Dawn Harmon, Dawn Harmon  Gender: Female  DOB: 07/02/1941  Age: 73 Years  PCP: Bluford Kaufmann  Office Follow Up:  Does the office need to follow up with this patient?: Yes  Instructions For The Office: Needs antibiotic changed per 07/28/13 note from Dr Maudie Mercury.  RN Note:  Started Macrobid 07/26/13; not on it > 72 hours.  Per Epic note from Dr. Maudie Mercury 07/28/13, informed "She does have a UTI. Unfortunately the bacteria is resistant to the nitrofurantoin. It is sensitive to ciprofloxacin and bactrim - I know she has had some issues with antibiotics in the past - which of these would she prefer? Please let me know and I will send in a new antibiotic."    Patient does NOT want Levofloxacin because it "kept me up all night."  Neither Bactrim or Ciprofloxacin sound familiar but she plans to research her notes. Willing to try either medication MD thinks is best. Pharmacy is [facility]. Please call to advise when Rx ordered. Also asking if MD recommends follow up urine culture 1 week after completes antibiotic.  Symptoms  Reason For Call & Symptoms: Called regarding UTI symptoms with moderate dysuria, urgency and frequency.   Seen 07/25/13 by Dr Maudie Mercury; diagnosed with UTI and started on Macrobid BID.  Symptoms are not improving.  Reviewed Health History In EMR: Yes  Reviewed Medications In EMR: Yes  Reviewed Allergies In EMR: Yes  Reviewed Surgeries / Procedures: Yes  Date of Onset of Symptoms: 07/21/2013  Treatments Tried: Pyridium, Macrobid BID  Treatments Tried Worked: No  Guideline(s) Used:  Urination Pain - Female  Disposition Per Guideline:   See Today in Office  Reason For Disposition Reached:   Age > 50 years  Advice Given:  Fluids:   Drink extra fluids. Drink 8-10 glasses of liquids a day (Reason: to produce a dilute, non-irritating urine).  Warm Saline SITZ Baths to Reduce Pain:  Sit in a warm saline bath for 20  minutes to cleanse the area and to reduce pain. Add 2 oz. of table salt or baking soda to a tub of water.  Warm Saline SITZ Baths to Reduce Pain:  Sit in a warm saline bath for 20 minutes to cleanse the area and to reduce pain. Add 2 oz. of table salt or baking soda to a tub of water.  Call Back If:  You become worse.  RN Overrode Recommendation:  Follow Up With Office Later  Already seen; MD note advising change Rx reviewed and explained to patient.  Needs new Rx ordered.

## 2013-07-28 NOTE — Telephone Encounter (Signed)
Pt seen Dr. Maudie Mercury for this.

## 2013-07-28 NOTE — Telephone Encounter (Signed)
Called and spoke with pt and pt is aware of Dr. Julianne Rice recommendations.

## 2013-07-28 NOTE — Telephone Encounter (Signed)
Bactrim sent in. Would advise if continued symptoms after treatment that she see or notify her urologist.

## 2013-07-28 NOTE — Telephone Encounter (Signed)
See urine result note; new medication called int to pharmacy.

## 2013-07-28 NOTE — Addendum Note (Signed)
Addended by: Lucretia Kern on: 07/28/2013 01:22 PM   Modules accepted: Orders

## 2013-10-28 ENCOUNTER — Encounter: Payer: Self-pay | Admitting: Internal Medicine

## 2013-10-28 ENCOUNTER — Ambulatory Visit (INDEPENDENT_AMBULATORY_CARE_PROVIDER_SITE_OTHER): Payer: Medicare HMO | Admitting: Internal Medicine

## 2013-10-28 VITALS — BP 110/70 | HR 55 | Temp 97.5°F | Resp 20 | Wt 148.0 lb

## 2013-10-28 DIAGNOSIS — N390 Urinary tract infection, site not specified: Secondary | ICD-10-CM

## 2013-10-28 DIAGNOSIS — F329 Major depressive disorder, single episode, unspecified: Secondary | ICD-10-CM

## 2013-10-28 DIAGNOSIS — F3289 Other specified depressive episodes: Secondary | ICD-10-CM

## 2013-10-28 DIAGNOSIS — R3915 Urgency of urination: Secondary | ICD-10-CM

## 2013-10-28 DIAGNOSIS — R350 Frequency of micturition: Secondary | ICD-10-CM

## 2013-10-28 LAB — POCT URINALYSIS DIPSTICK
Bilirubin, UA: 1
NITRITE UA: POSITIVE
PH UA: 7
PROTEIN UA: 3
RBC UA: 3
SPEC GRAV UA: 1.01
UROBILINOGEN UA: 2

## 2013-10-28 MED ORDER — SULFAMETHOXAZOLE-TMP DS 800-160 MG PO TABS: 1.0000 | ORAL_TABLET | Freq: Two times a day (BID) | ORAL | Status: DC

## 2013-10-28 NOTE — Progress Notes (Signed)
   Subjective:    Patient ID: Dawn Harmon, female    DOB: 1940-09-24, 73 y.o.   MRN: [id number]  HPI  73 year old patient who was seen in January for a UTI.  Urine culture revealed a Klebsiella species, sensitive to sulfa, but resistant to nitrofurantoin.  She has a history of chronic recurrent UTIs and states that Levaquin in the past.  Has been associated with  insomnia  No past medical history on file.  History   Social History  . Marital Status: Married    Spouse Name: N/A    Number of Children: N/A  . Years of Education: N/A   Occupational History  . Not on file.   Social History Main Topics  . Smoking status: Never Smoker   . Smokeless tobacco: Not on file  . Alcohol Use: Not on file  . Drug Use: Not on file  . Sexual Activity: Not on file   Other Topics Concern  . Not on file   Social History Narrative  . No narrative on file    No past surgical history on file.  No family history on file.  No Known Allergies  Current Outpatient Prescriptions on File Prior to Visit  Medication Sig Dispense Refill  . phenazopyridine (PYRIDIUM) 200 MG tablet Take 200 mg by mouth 3 (three) times daily as needed.         No current facility-administered medications on file prior to visit.    BP 110/70  Pulse 55  Temp(Src) 97.5 F (36.4 C) (Oral)  Resp 20  Wt 148 lb (67.132 kg)     Review of Systems  Genitourinary: Positive for dysuria and frequency.  Neurological: Positive for weakness.       Objective:   Physical Exam  Constitutional: She appears well-developed and well-nourished. No distress.  Psychiatric: She has a normal mood and affect. Her behavior is normal.          Assessment & Plan:   Acute UTI.  Will treat with Septra.  Will send a culture Schedule CPX  Patient has been followed by Dr. Brendia Sacks, who apparently is a chronic fatigue specialist in [place]

## 2013-10-28 NOTE — Progress Notes (Signed)
Pre-visit discussion using our clinic review tool. No additional management support is needed unless otherwise documented below in the visit note.  

## 2013-10-28 NOTE — Patient Instructions (Signed)
Drink as much fluid as you  can tolerate over the next few days  Take your antibiotic as prescribed until ALL of it is gone, but stop if you develop a rash, swelling, or any side effects of the medication.  Contact our office as soon as possible if  there are side effects of the medication.   

## 2013-10-31 LAB — URINE CULTURE

## 2014-02-26 ENCOUNTER — Other Ambulatory Visit (INDEPENDENT_AMBULATORY_CARE_PROVIDER_SITE_OTHER): Payer: Medicare HMO

## 2014-02-26 DIAGNOSIS — Z0000 Encounter for general adult medical examination without abnormal findings: Secondary | ICD-10-CM

## 2014-02-26 DIAGNOSIS — Z136 Encounter for screening for cardiovascular disorders: Secondary | ICD-10-CM

## 2014-02-26 DIAGNOSIS — E559 Vitamin D deficiency, unspecified: Secondary | ICD-10-CM

## 2014-02-26 LAB — HEPATIC FUNCTION PANEL
ALBUMIN: 3.9 g/dL (ref 3.5–5.2)
ALK PHOS: 56 U/L (ref 39–117)
ALT: 12 U/L (ref 0–35)
AST: 22 U/L (ref 0–37)
Bilirubin, Direct: 0 mg/dL (ref 0.0–0.3)
TOTAL PROTEIN: 7.5 g/dL (ref 6.0–8.3)
Total Bilirubin: 0.7 mg/dL (ref 0.2–1.2)

## 2014-02-26 LAB — LIPID PANEL
CHOL/HDL RATIO: 3
Cholesterol: 213 mg/dL — ABNORMAL HIGH (ref 0–200)
HDL: 83.3 mg/dL (ref >39.00)
LDL Cholesterol: 112 mg/dL — ABNORMAL HIGH (ref 0–99)
NONHDL: 129.7
TRIGLYCERIDES: 87 mg/dL (ref 0.0–149.0)
VLDL: 17.4 mg/dL (ref 0.0–40.0)

## 2014-02-26 LAB — CBC WITH DIFFERENTIAL/PLATELET
BASOS ABS: 0 10*3/uL (ref 0.0–0.1)
Basophils Relative: 0.8 % (ref 0.0–3.0)
Eosinophils Absolute: 0.2 10*3/uL (ref 0.0–0.7)
Eosinophils Relative: 2.8 % (ref 0.0–5.0)
HCT: 42.7 % (ref 36.0–46.0)
HEMOGLOBIN: 14.1 g/dL (ref 12.0–15.0)
Lymphocytes Relative: 31 % (ref 12.0–46.0)
Lymphs Abs: 1.7 10*3/uL (ref 0.7–4.0)
MCHC: 33.1 g/dL (ref 30.0–36.0)
MCV: 95.7 fl (ref 78.0–100.0)
Monocytes Absolute: 0.3 10*3/uL (ref 0.1–1.0)
Monocytes Relative: 6 % (ref 3.0–12.0)
Neutro Abs: 3.2 10*3/uL (ref 1.4–7.7)
Neutrophils Relative %: 59.4 % (ref 43.0–77.0)
Platelets: 316 10*3/uL (ref 150.0–400.0)
RBC: 4.46 Mil/uL (ref 3.87–5.11)
RDW: 15 % (ref 11.5–15.5)
WBC: 5.4 10*3/uL (ref 4.0–10.5)

## 2014-02-26 LAB — POCT URINALYSIS DIPSTICK
Bilirubin, UA: NEGATIVE
Blood, UA: NEGATIVE
Glucose, UA: NEGATIVE
Ketones, UA: NEGATIVE
NITRITE UA: NEGATIVE
PROTEIN UA: NEGATIVE
SPEC GRAV UA: 1.01
UROBILINOGEN UA: 0.2
pH, UA: 7

## 2014-02-26 LAB — BASIC METABOLIC PANEL
BUN: 14 mg/dL (ref 6–23)
CO2: 25 meq/L (ref 19–32)
Calcium: 9.5 mg/dL (ref 8.4–10.5)
Chloride: 104 mEq/L (ref 96–112)
Creatinine, Ser: 1.1 mg/dL (ref 0.4–1.2)
GFR: 52.29 mL/min — ABNORMAL LOW (ref >60.00)
Glucose, Bld: 79 mg/dL (ref 70–99)
Potassium: 4.4 mEq/L (ref 3.5–5.1)
SODIUM: 139 meq/L (ref 135–145)

## 2014-02-26 LAB — TSH: TSH: 0.72 u[IU]/mL (ref 0.35–4.50)

## 2014-02-26 NOTE — Addendum Note (Signed)
Addended by: Elmer Picker on: 02/26/2014 11:21 AM   Modules accepted: Orders

## 2014-02-26 NOTE — Addendum Note (Signed)
Addended by: Townsend Roger D on: 02/26/2014 10:57 AM   Modules accepted: Orders

## 2014-03-02 LAB — VITAMIN D 1,25 DIHYDROXY
VITAMIN D 1, 25 (OH) TOTAL: 72 pg/mL (ref 18–72)
VITAMIN D3 1, 25 (OH): 72 pg/mL

## 2014-03-05 ENCOUNTER — Ambulatory Visit (INDEPENDENT_AMBULATORY_CARE_PROVIDER_SITE_OTHER): Payer: Medicare HMO | Admitting: Internal Medicine

## 2014-03-05 ENCOUNTER — Encounter: Payer: Self-pay | Admitting: Internal Medicine

## 2014-03-05 ENCOUNTER — Encounter: Payer: Medicare HMO | Admitting: Internal Medicine

## 2014-03-05 VITALS — BP 110/70 | HR 81 | Temp 97.8°F | Resp 18 | Ht 64.0 in | Wt 141.0 lb

## 2014-03-05 DIAGNOSIS — F329 Major depressive disorder, single episode, unspecified: Secondary | ICD-10-CM

## 2014-03-05 DIAGNOSIS — M1990 Unspecified osteoarthritis, unspecified site: Secondary | ICD-10-CM | POA: Insufficient documentation

## 2014-03-05 DIAGNOSIS — M8949 Other hypertrophic osteoarthropathy, multiple sites: Secondary | ICD-10-CM

## 2014-03-05 DIAGNOSIS — F3289 Other specified depressive episodes: Secondary | ICD-10-CM

## 2014-03-05 DIAGNOSIS — M5412 Radiculopathy, cervical region: Secondary | ICD-10-CM

## 2014-03-05 DIAGNOSIS — M159 Polyosteoarthritis, unspecified: Secondary | ICD-10-CM

## 2014-03-05 DIAGNOSIS — Z0000 Encounter for general adult medical examination without abnormal findings: Secondary | ICD-10-CM

## 2014-03-05 DIAGNOSIS — M150 Primary generalized (osteo)arthritis: Secondary | ICD-10-CM

## 2014-03-05 DIAGNOSIS — Z23 Encounter for immunization: Secondary | ICD-10-CM

## 2014-03-05 NOTE — Patient Instructions (Signed)
Take a calcium supplement, plus [phone] units of vitamin D    It is important that you exercise regularly, at least 20 minutes 3 to 4 times per week.  If you develop chest pain or shortness of breath seek  medical attention.  Health Maintenance Adopting a healthy lifestyle and getting preventive care can go a long way to promote health and wellness. Talk with your health care provider about what schedule of regular examinations is right for you. This is a good chance for you to check in with your provider about disease prevention and staying healthy. In between checkups, there are plenty of things you can do on your own. Experts have done a lot of research about which lifestyle changes and preventive measures are most likely to keep you healthy. Ask your health care provider for more information. WEIGHT AND DIET  Eat a healthy diet  Be sure to include plenty of vegetables, fruits, low-fat dairy products, and lean protein.  Do not eat a lot of foods high in solid fats, added sugars, or salt.  Get regular exercise. This is one of the most important things you can do for your health.  Most adults should exercise for at least 150 minutes each week. The exercise should increase your heart rate and make you sweat (moderate-intensity exercise).  Most adults should also do strengthening exercises at least twice a week. This is in addition to the moderate-intensity exercise.  Maintain a healthy weight  Body mass index (BMI) is a measurement that can be used to identify possible weight problems. It estimates body fat based on height and weight. Your health care provider can help determine your BMI and help you achieve or maintain a healthy weight.  For females 84 years of age and older:   A BMI below 18.5 is considered underweight.  A BMI of 18.5 to 24.9 is normal.  A BMI of 25 to 29.9 is considered overweight.  A BMI of 30 and above is considered obese.  Watch levels of cholesterol and  blood lipids  You should start having your blood tested for lipids and cholesterol at 73 years of age, then have this test every 5 years.  You may need to have your cholesterol levels checked more often if:  Your lipid or cholesterol levels are high.  You are older than 73 years of age.  You are at high risk for heart disease.  CANCER SCREENING   Lung Cancer  Lung cancer screening is recommended for adults 41-30 years old who are at high risk for lung cancer because of a history of smoking.  A yearly low-dose CT scan of the lungs is recommended for people who:  Currently smoke.  Have quit within the past 15 years.  Have at least a 30-pack-year history of smoking. A pack year is smoking an average of one pack of cigarettes a day for 1 year.  Yearly screening should continue until it has been 15 years since you quit.  Yearly screening should stop if you develop a health problem that would prevent you from having lung cancer treatment.  Breast Cancer  Practice breast self-awareness. This means understanding how your breasts normally appear and feel.  It also means doing regular breast self-exams. Let your health care provider know about any changes, no matter how small.  If you are in your 20s or 30s, you should have a clinical breast exam (CBE) by a health care provider every 1-3 years as part of a regular health  exam.  If you are 40 or older, have a CBE every year. Also consider having a breast X-ray (mammogram) every year.  If you have a family history of breast cancer, talk to your health care provider about genetic screening.  If you are at high risk for breast cancer, talk to your health care provider about having an MRI and a mammogram every year.  Breast cancer gene (BRCA) assessment is recommended for women who have family members with BRCA-related cancers. BRCA-related cancers include:  Breast.  Ovarian.  Tubal.  Peritoneal cancers.  Results of the  assessment will determine the need for genetic counseling and BRCA1 and BRCA2 testing. Cervical Cancer Routine pelvic examinations to screen for cervical cancer are no longer recommended for nonpregnant women who are considered low risk for cancer of the pelvic organs (ovaries, uterus, and vagina) and who do not have symptoms. A pelvic examination may be necessary if you have symptoms including those associated with pelvic infections. Ask your health care provider if a screening pelvic exam is right for you.   The Pap test is the screening test for cervical cancer for women who are considered at risk.  If you had a hysterectomy for a problem that was not cancer or a condition that could lead to cancer, then you no longer need Pap tests.  If you are older than 65 years, and you have had normal Pap tests for the past 10 years, you no longer need to have Pap tests.  If you have had past treatment for cervical cancer or a condition that could lead to cancer, you need Pap tests and screening for cancer for at least 20 years after your treatment.  If you no longer get a Pap test, assess your risk factors if they change (such as having a new sexual partner). This can affect whether you should start being screened again.  Some women have medical problems that increase their chance of getting cervical cancer. If this is the case for you, your health care provider may recommend more frequent screening and Pap tests.  The human papillomavirus (HPV) test is another test that may be used for cervical cancer screening. The HPV test looks for the virus that can cause cell changes in the cervix. The cells collected during the Pap test can be tested for HPV.  The HPV test can be used to screen women 72 years of age and older. Getting tested for HPV can extend the interval between normal Pap tests from three to five years.  An HPV test also should be used to screen women of any age who have unclear Pap test  results.  After 73 years of age, women should have HPV testing as often as Pap tests.  Colorectal Cancer  This type of cancer can be detected and often prevented.  Routine colorectal cancer screening usually begins at 73 years of age and continues through 73 years of age.  Your health care provider may recommend screening at an earlier age if you have risk factors for colon cancer.  Your health care provider may also recommend using home test kits to check for hidden blood in the stool.  A small camera at the end of a tube can be used to examine your colon directly (sigmoidoscopy or colonoscopy). This is done to check for the earliest forms of colorectal cancer.  Routine screening usually begins at age 9.  Direct examination of the colon should be repeated every 5-10 years through 73 years of  age. However, you may need to be screened more often if early forms of precancerous polyps or small growths are found. Skin Cancer  Check your skin from head to toe regularly.  Tell your health care provider about any new moles or changes in moles, especially if there is a change in a mole's shape or color.  Also tell your health care provider if you have a mole that is larger than the size of a pencil eraser.  Always use sunscreen. Apply sunscreen liberally and repeatedly throughout the day.  Protect yourself by wearing long sleeves, pants, a wide-brimmed hat, and sunglasses whenever you are outside. HEART DISEASE, DIABETES, AND HIGH BLOOD PRESSURE   Have your blood pressure checked at least every 1-2 years. High blood pressure causes heart disease and increases the risk of stroke.  If you are between 98 years and 20 years old, ask your health care provider if you should take aspirin to prevent strokes.  Have regular diabetes screenings. This involves taking a blood sample to check your fasting blood sugar level.  If you are at a normal weight and have a low risk for diabetes, have this  test once every three years after 73 years of age.  If you are overweight and have a high risk for diabetes, consider being tested at a younger age or more often. PREVENTING INFECTION  Hepatitis B  If you have a higher risk for hepatitis B, you should be screened for this virus. You are considered at high risk for hepatitis B if:  You were born in a country where hepatitis B is common. Ask your health care provider which countries are considered high risk.  Your parents were born in a high-risk country, and you have not been immunized against hepatitis B (hepatitis B vaccine).  You have HIV or AIDS.  You use needles to inject street drugs.  You live with someone who has hepatitis B.  You have had sex with someone who has hepatitis B.  You get hemodialysis treatment.  You take certain medicines for conditions, including cancer, organ transplantation, and autoimmune conditions. Hepatitis C  Blood testing is recommended for:  Everyone born from [birth year] through 1965.  Anyone with known risk factors for hepatitis C. Sexually transmitted infections (STIs)  You should be screened for sexually transmitted infections (STIs) including gonorrhea and chlamydia if:  You are sexually active and are younger than 73 years of age.  You are older than 73 years of age and your health care provider tells you that you are at risk for this type of infection.  Your sexual activity has changed since you were last screened and you are at an increased risk for chlamydia or gonorrhea. Ask your health care provider if you are at risk.  If you do not have HIV, but are at risk, it may be recommended that you take a prescription medicine daily to prevent HIV infection. This is called pre-exposure prophylaxis (PrEP). You are considered at risk if:  You are sexually active and do not regularly use condoms or know the HIV status of your partner(s).  You take drugs by injection.  You are sexually active with  a partner who has HIV. Talk with your health care provider about whether you are at high risk of being infected with HIV. If you choose to begin PrEP, you should first be tested for HIV. You should then be tested every 3 months for as long as you are taking PrEP.  PREGNANCY  If you are premenopausal and you may become pregnant, ask your health care provider about preconception counseling.  If you may become pregnant, take 400 to 800 micrograms (mcg) of folic acid every day.  If you want to prevent pregnancy, talk to your health care provider about birth control (contraception). OSTEOPOROSIS AND MENOPAUSE   Osteoporosis is a disease in which the bones lose minerals and strength with aging. This can result in serious bone fractures. Your risk for osteoporosis can be identified using a bone density scan.  If you are 52 years of age or older, or if you are at risk for osteoporosis and fractures, ask your health care provider if you should be screened.  Ask your health care provider whether you should take a calcium or vitamin D supplement to lower your risk for osteoporosis.  Menopause may have certain physical symptoms and risks.  Hormone replacement therapy may reduce some of these symptoms and risks. Talk to your health care provider about whether hormone replacement therapy is right for you.  HOME CARE INSTRUCTIONS   Schedule regular health, dental, and eye exams.  Stay current with your immunizations.   Do not use any tobacco products including cigarettes, chewing tobacco, or electronic cigarettes.  If you are pregnant, do not drink alcohol.  If you are breastfeeding, limit how much and how often you drink alcohol.  Limit alcohol intake to no more than 1 drink per day for nonpregnant women. One drink equals 12 ounces of beer, 5 ounces of wine, or 1 ounces of hard liquor.  Do not use street drugs.  Do not share needles.  Ask your health care provider for help if you need  support or information about quitting drugs.  Tell your health care provider if you often feel depressed.  Tell your health care provider if you have ever been abused or do not feel safe at home. Document Released: 01/02/2011 Document Revised: 11/03/2013 Document Reviewed: 05/21/2013 [facility] Patient Information 2015 [place], [place]. This information is not intended to replace advice given to you by your health care provider. Make sure you discuss any questions you have with your health care provider. Cardiac Diet This diet can help prevent heart disease and stroke. Many factors influence your heart health, including eating and exercise habits. Coronary risk rises a lot with abnormal blood fat (lipid) levels. Cardiac meal planning includes limiting unhealthy fats, increasing healthy fats, and making other small dietary changes. General guidelines are as follows:  Adjust calorie intake to reach and maintain desirable body weight.  Limit total fat intake to less than 30% of total calories. Saturated fat should be less than 7% of calories.  Saturated fats are found in animal products and in some vegetable products. Saturated vegetable fats are found in coconut oil, cocoa butter, palm oil, and palm kernel oil. Read labels carefully to avoid these products as much as possible. Use butter in moderation. Choose tub margarines and oils that have 2 grams of fat or less. Good cooking oils are canola and olive oils.  Practice low-fat cooking techniques. Do not fry food. Instead, broil, bake, boil, steam, grill, roast on a rack, stir-fry, or microwave it. Other fat reducing suggestions include:  Remove the skin from poultry.  Remove all visible fat from meats.  Skim the fat off stews, soups, and gravies before serving them.  Steam vegetables in water or broth instead of sauting them in fat.  Avoid foods with trans fat (or hydrogenated oils), such as commercially fried foods  and commercially baked  goods. Commercial shortening and deep-frying fats will contain trans fat.  Increase intake of fruits, vegetables, whole grains, and legumes to replace foods high in fat.  Increase consumption of nuts, legumes, and seeds to at least 4 servings weekly. One serving of a legume equals  cup, and 1 serving of nuts or seeds equals  cup.  Choose whole grains more often. Have 3 servings per day (a serving is 1 ounce [oz]).  Eat 4 to 5 servings of vegetables per day. A serving of vegetables is 1 cup of raw leafy vegetables;  cup of raw or cooked cut-up vegetables;  cup of vegetable juice.  Eat 4 to 5 servings of fruit per day. A serving of fruit is 1 medium whole fruit;  cup of dried fruit;  cup of fresh, frozen, or canned fruit;  cup of 100% fruit juice.  Increase your intake of dietary fiber to 20 to 30 grams per day. Insoluble fiber may help lower your risk of heart disease and may help curb your appetite.  Soluble fiber binds cholesterol to be removed from the blood. Foods high in soluble fiber are dried beans, citrus fruits, oats, apples, bananas, broccoli, Brussels sprouts, and eggplant.  Try to include foods fortified with plant sterols or stanols, such as yogurt, breads, juices, or margarines. Choose several fortified foods to achieve a daily intake of 2 to 3 grams of plant sterols or stanols.  Foods with omega-3 fats can help reduce your risk of heart disease. Aim to have a 3.5 oz portion of fatty fish twice per week, such as salmon, mackerel, albacore tuna, sardines, lake trout, or herring. If you wish to take a fish oil supplement, choose one that contains 1 gram of both DHA and EPA.  Limit processed meats to 2 servings (3 oz portion) weekly.  Limit the sodium in your diet to 1500 milligrams (mg) per day. If you have high blood pressure, talk to a registered dietitian about a DASH (Dietary Approaches to Stop Hypertension) eating plan.  Limit sweets and beverages with added sugar, such  as soda, to no more than 5 servings per week. One serving is:   1 tablespoon sugar.  1 tablespoon jelly or jam.   cup sorbet.  1 cup lemonade.   cup regular soda. CHOOSING FOODS Starches  Allowed: Breads: All kinds (wheat, rye, raisin, white, oatmeal, [place], [place], and English muffin bread). Low-fat rolls: English muffins, frankfurter and hamburger buns, bagels, pita bread, tortillas (not fried). Pancakes, waffles, biscuits, and muffins made with recommended oil.  Avoid: Products made with saturated or trans fats, oils, or whole milk products. Butter rolls, cheese breads, croissants. Commercial doughnuts, muffins, sweet rolls, biscuits, waffles, pancakes, store-bought mixes. Crackers  Allowed: Low-fat crackers and snacks: Animal, graham, rye, saltine (with recommended oil, no lard), oyster, and matzo crackers. Bread sticks, melba toast, rusks, flatbread, pretzels, and light popcorn.  Avoid: High-fat crackers: cheese crackers, butter crackers, and those made with coconut, palm oil, or trans fat (hydrogenated oils). Buttered popcorn. Cereals  Allowed: Hot or cold whole-grain cereals.  Avoid: Cereals containing coconut, hydrogenated vegetable fat, or animal fat. Potatoes / Pasta / Rice  Allowed: All kinds of potatoes, rice, and pasta (such as macaroni, spaghetti, and noodles).  Avoid: Pasta or rice prepared with cream sauce or high-fat cheese. Chow mein noodles, [place] fries. Vegetables  Allowed: All vegetables and vegetable juices.  Avoid: Fried vegetables. Vegetables in cream, butter, or high-fat cheese sauces. Limit coconut. Fruit in cream  or custard. Protein  Allowed: Limit your intake of meat, seafood, and poultry to no more than 6 oz (cooked weight) per day. All lean, well-trimmed beef, veal, pork, and lamb. All chicken and [place] without skin. All fish and shellfish. Wild game: wild duck, rabbit, pheasant, and venison. Egg whites or low-cholesterol egg substitutes may  be used as desired. Meatless dishes: recipes with dried beans, peas, lentils, and tofu (soybean curd). Seeds and nuts: all seeds and most nuts.  Avoid: Prime grade and other heavily marbled and fatty meats, such as short ribs, spare ribs, rib eye roast or steak, frankfurters, sausage, bacon, and high-fat luncheon meats, mutton. Caviar. Commercially fried fish. Domestic duck, goose, venison sausage. Organ meats: liver, gizzard, heart, chitterlings, brains, kidney, sweetbreads. Dairy  Allowed: Low-fat cheeses: nonfat or low-fat cottage cheese (1% or 2% fat), cheeses made with part skim milk, such as mozzarella, farmers, string, or ricotta. (Cheeses should be labeled no more than 2 to 6 grams fat per oz.). Skim (or 1%) milk: liquid, powdered, or evaporated. Buttermilk made with low-fat milk. Drinks made with skim or low-fat milk or cocoa. Chocolate milk or cocoa made with skim or low-fat (1%) milk. Nonfat or low-fat yogurt.  Avoid: Whole milk cheeses, including colby, cheddar, [place], Monterey Jack, [place], [place], [place], American, Swiss, and blue. Creamed cottage cheese, cream cheese. Whole milk and whole milk products, including buttermilk or yogurt made from whole milk, drinks made from whole milk. Condensed milk, evaporated whole milk, and 2% milk. Soups and Combination Foods  Allowed: Low-fat low-sodium soups: broth, dehydrated soups, homemade broth, soups with the fat removed, homemade cream soups made with skim or low-fat milk. Low-fat spaghetti, lasagna, chili, and Spanish rice if low-fat ingredients and low-fat cooking techniques are used.  Avoid: Cream soups made with whole milk, cream, or high-fat cheese. All other soups. Desserts and Sweets  Allowed: Sherbet, fruit ices, gelatins, meringues, and angel food cake. Homemade desserts with recommended fats, oils, and milk products. Jam, jelly, honey, marmalade, sugars, and syrups. Pure sugar candy, such as gum drops, hard candy, jelly beans,  marshmallows, mints, and small amounts of dark chocolate.  Avoid: Commercially prepared cakes, pies, cookies, frosting, pudding, or mixes for these products. Desserts containing whole milk products, chocolate, coconut, lard, palm oil, or palm kernel oil. Ice cream or ice cream drinks. Candy that contains chocolate, coconut, butter, hydrogenated fat, or unknown ingredients. Buttered syrups. Fats and Oils  Allowed: Vegetable oils: safflower, sunflower, corn, soybean, cottonseed, sesame, canola, olive, or peanut. Non-hydrogenated margarines. Salad dressing or mayonnaise: homemade or commercial, made with a recommended oil. Low or nonfat salad dressing or mayonnaise.  Limit added fats and oils to 6 to 8 tsp per day (includes fats used in cooking, baking, salads, and spreads on bread). Remember to count the "hidden fats" in foods.  Avoid: Solid fats and shortenings: butter, lard, salt pork, bacon drippings. Gravy containing meat fat, shortening, or suet. Cocoa butter, coconut. Coconut oil, palm oil, palm kernel oil, or hydrogenated oils: these ingredients are often used in bakery products, nondairy creamers, whipped toppings, candy, and commercially fried foods. Read labels carefully. Salad dressings made of unknown oils, sour cream, or cheese, such as blue cheese and Roquefort. Cream, all kinds: half-and-half, light, heavy, or whipping. Sour cream or cream cheese (even if "light" or low-fat). Nondairy cream substitutes: coffee creamers and sour cream substitutes made with palm, palm kernel, hydrogenated oils, or coconut oil. Beverages  Allowed: Coffee (regular or decaffeinated), tea. Diet carbonated beverages, mineral  water. Alcohol: Check with your caregiver. Moderation is recommended.  Avoid: Whole milk, regular sodas, and juice drinks with added sugar. Condiments  Allowed: All seasonings and condiments. Cocoa powder. "Cream" sauces made with recommended ingredients.  Avoid: Carob powder made with  hydrogenated fats. SAMPLE MENU Breakfast   cup orange juice   cup oatmeal  1 slice toast  1 tsp margarine  1 cup skim milk Lunch  [place] sandwich with 2 oz [place], 2 slices bread  Lettuce and tomato slices  Fresh fruit  Carrot sticks  Coffee or tea Snack  Fresh fruit or low-fat crackers Dinner  3 oz lean ground beef  1 baked potato  1 tsp margarine   cup asparagus  Lettuce salad  1 tbs non-creamy dressing   cup peach slices  1 cup skim milk Document Released: 03/28/2008 Document Revised: 12/19/2011 Document Reviewed: 08/19/2013 [employer] Patient Information 2015 [place], [place]. This information is not intended to replace advice given to you by your health care provider. Make sure you discuss any questions you have with your health care provider.   Schedule your mammogram.

## 2014-03-05 NOTE — Progress Notes (Signed)
Subjective:    Patient ID: Dawn Harmon, female    DOB: September 05, 1940, 73 y.o.   MRN: [id number]  HPI 73 year-old patient who is seen today for a wellness exam.    03/10/11. She has been followed by gynecology through the years. She has a history of chronic fatigue and fibromyalgia. She has had some insomnia issues in the past. Complaints today include worsening depression. Since June she has had worsening weakness fatigue generalized anxiety and aching she describes nausea and decreased concentration. She has seen GI (Dr. Collene Mares) due to the nausea. She has had a colonoscopy approximately 5 years ago. Approximately 2 weeks ago she resumed bupropion sustained release 150 mg. She has tried multiple drugs in the past with significant side effects. She has had psychiatric evaluation in the past. Her last gynecologic exam was approximately one year ago. She has had a recent eye exam She continues to have numbness involving the right arm she has seen orthopedics and chiropractor. She wishes to pursue further evaluation with a MRI. She apparently had nerve conduction and EMG is performed as well by orthopedics;  it was felt in February of this year she had a mild cervical radiculopathy  03/05/14. This has done quite well recently.  She has been off all psychotropic medications and seems to be doing quite well except for chronic insomnia  Past medical history she is a gravida 3 para 3 abortus 0. She had  a remote tonsillectomy History of fibromyalgia and chronic fatigue History depression  Family history. Father died at age 56 , history of  depression Mother died age 28 breast cancer. One sister also had breast cancer  1. Risk factors, based on past  M,S,F history-  no significant cardiovascular risk factors  2.  Physical activities: Remains fairly sedentary due to weakness and chronic fatigue. She and her husband have traveled little over the past 2 years due to weakness  3.  Depression/mood: History of  major depression has had psychiatric evaluation in the past and has been on multiple medications. For the past 2 weeks has resumed bupropion  4.  Hearing: No deficits  5.  ADL's:  independent in all aspects of daily living  6.  Fall risk: Low 7.  Home safety: No problems identified  8.  Height weight, and visual acuity; height and weight stable no change in visual acuity  9.  Counseling: Regular exercise encouraged she was encouraged to consider psychiatric followup  10. Lab orders based on risk factors: Laboratory studies were reviewed  11. Referral will set up for a  cervical MRI :  12. Care plan: Cervical MRI. Greater exercise heart healthy diet encouraged 13. Cognitive assessment: Alert and oriented normal affect        Review of Systems  Constitutional: Positive for fatigue. Negative for fever, appetite change and unexpected weight change.  HENT: Negative for congestion, dental problem, ear pain, hearing loss, mouth sores, nosebleeds, sinus pressure, sore throat, tinnitus, trouble swallowing and voice change.   Eyes: Negative for photophobia, pain, redness and visual disturbance.  Respiratory: Negative for cough, chest tightness and shortness of breath.   Cardiovascular: Negative for chest pain, palpitations and leg swelling.  Gastrointestinal: Negative for nausea, vomiting, abdominal pain, diarrhea, constipation, blood in stool, abdominal distention and rectal pain.  Genitourinary: Negative for dysuria, urgency, frequency, hematuria, flank pain, vaginal bleeding, vaginal discharge, difficulty urinating, genital sores, vaginal pain, menstrual problem and pelvic pain.  Musculoskeletal: Negative for arthralgias, back pain and neck stiffness.  Skin: Negative for rash.  Neurological: Positive for weakness and numbness. Negative for dizziness, seizures, syncope, speech difficulty, light-headedness and headaches.       Numbness of the right arm in the ulnar distribution   Hematological: Negative for adenopathy. Does not bruise/bleed easily.  Psychiatric/Behavioral: Positive for sleep disturbance, dysphoric mood and decreased concentration. Negative for suicidal ideas, behavioral problems, self-injury and agitation. The patient is nervous/anxious.        Objective:   Physical Exam  Constitutional: She is oriented to person, place, and time. She appears well-developed and well-nourished.  HENT:  Head: Normocephalic and atraumatic.  Right Ear: External ear normal.  Left Ear: External ear normal.  Mouth/Throat: Oropharynx is clear and moist.  Pharyngeal crowding  Eyes: Conjunctivae and EOM are normal.  Neck: Normal range of motion. Neck supple. No JVD present. No thyromegaly present.  Cardiovascular: Normal rate, regular rhythm, normal heart sounds and intact distal pulses.   No murmur heard. Pulmonary/Chest: Effort normal and breath sounds normal. She has no wheezes. She has no rales.  Abdominal: Soft. Bowel sounds are normal. She exhibits no distension and no mass. There is no tenderness. There is no rebound and no guarding.  Musculoskeletal: Normal range of motion. She exhibits no edema and no tenderness.  Neurological: She is alert and oriented to person, place, and time. She has normal reflexes. No cranial nerve deficit. She exhibits normal muscle tone. Coordination normal.  Skin: Skin is warm and dry. No rash noted.  Psychiatric: She has a normal mood and affect. Her behavior is normal.          Assessment & Plan:   Preventive health examination  Depression with chronic fatigue and possible fibromyalgia.  The patient has done well off medications. Laboratory studies reviewed and were unremarkable Consider followup colonoscopy in 2 years Continue calcium and vitamin D supplements

## 2014-03-05 NOTE — Progress Notes (Signed)
Pre visit review using our clinic review tool, if applicable. No additional management support is needed unless otherwise documented below in the visit note. 

## 2014-04-10 ENCOUNTER — Other Ambulatory Visit: Payer: Self-pay | Admitting: *Deleted

## 2014-04-10 DIAGNOSIS — I491 Atrial premature depolarization: Secondary | ICD-10-CM

## 2014-04-14 ENCOUNTER — Encounter: Payer: Self-pay | Admitting: *Deleted

## 2014-04-14 NOTE — Progress Notes (Signed)
Patient ID: Dawn Harmon, female   DOB: 08/21/1940, 73 y.o.   MRN: [id number] Patient declined to have 30 day cardiac event monitor applied at this time due to unknown out of pocket expense.  Patient given billing codes from [employer] and [facility] for both a mobile cardiac telemetry unit and a cardiac event monitor.  They will check with their insurance to see which unit would be covered and what their out of pocket costs will be.  The patient will reschedule if these amounts are found acceptable.

## 2014-04-14 NOTE — Progress Notes (Signed)
Patient ID: Dawn Harmon, female   DOB: December 29, 1940, 73 y.o.   MRN: [id number] [employer] verite 30 day cardiac event monitor applied to patient.

## 2014-04-22 ENCOUNTER — Ambulatory Visit (INDEPENDENT_AMBULATORY_CARE_PROVIDER_SITE_OTHER): Payer: Medicare HMO | Admitting: Cardiovascular Disease

## 2014-04-22 ENCOUNTER — Encounter: Payer: Self-pay | Admitting: Cardiovascular Disease

## 2014-04-22 ENCOUNTER — Ambulatory Visit (INDEPENDENT_AMBULATORY_CARE_PROVIDER_SITE_OTHER): Payer: Medicare HMO

## 2014-04-22 VITALS — BP 149/77 | HR 71 | Resp 16 | Ht 64.0 in | Wt 142.9 lb

## 2014-04-22 DIAGNOSIS — Z23 Encounter for immunization: Secondary | ICD-10-CM

## 2014-04-22 DIAGNOSIS — I493 Ventricular premature depolarization: Secondary | ICD-10-CM | POA: Insufficient documentation

## 2014-04-22 DIAGNOSIS — R9431 Abnormal electrocardiogram [ECG] [EKG]: Secondary | ICD-10-CM

## 2014-04-22 DIAGNOSIS — I472 Ventricular tachycardia: Secondary | ICD-10-CM

## 2014-04-22 DIAGNOSIS — I4729 Other ventricular tachycardia: Secondary | ICD-10-CM

## 2014-04-22 NOTE — Assessment & Plan Note (Signed)
Ventricular arrhythmia seems to be quite prevalent, but is completely asymptomatic and has not been associated with syncope. By physical exam there is no evidence of structural heart disease and her symptoms did not suggest coronary disease, although one cannot exclude congestive heart failure as a contributor to her fatigue. The PVCs appear to have a outflow tract morphology. Need to screen for structural cardiac abnormalities with an echocardiogram and a treadmill stress test. Have also recommended a 24 hour Holter monitor to better quantify the burden of arrhythmia.  If there is no evidence of structural heart disease or malignant arrhythmia, I would favor avoiding any pharmacological therapy. Beta blockers would likely worsen her symptoms of fatigue, which are truly her only complaint.

## 2014-04-22 NOTE — Progress Notes (Signed)
Patient ID: Dawn Harmon, female   DOB: 1941-03-14, 73 y.o.   MRN: [id number]     Reason for office visit Ventricular arrhythmia  Mrs. Czerwinski has no known history of cardiac disease but has numerous somatic complaints which are attributed to chronic fatigue-fibromyalgia syndrome. She has seen numerous medical practitioners over the years, but only at a recent appointment with Dr. Leslee Home was noted to have an irregular heart rhythm. Her electrocardiogram showed frequent PVCs as well as repeated episodes of 3 beat nonsustained ventricular tachycardia. She is unaware of the arrhythmia. He has never had syncope or presyncope. She has unsteadiness that occurs only with changes in position. There is no family history of syncope, cardiac arrests or unexplained sudden death. She does not have any cardiac risk factors: no history of hypertension, diabetes, hypercholesterolemia, smoking.   Quiara and her husband Delsa Bern have relocated to [place] from [place] and [place].   No Known Allergies  Current Outpatient Prescriptions  Medication Sig Dispense Refill  . Acetylcysteine (N-ACETYL-L-CYSTEINE PO) Take 1 capsule by mouth daily.      . Cholecalciferol (VITAMIN D3) 1200 UNIT/15ML LIQD Take 15 mLs by mouth daily.      . Cranberry 300 MG tablet Take 300 mg by mouth daily.      . Cyanocobalamin (VITAMIN B-12) 1000 MCG SUBL Place 1 tablet under the tongue daily.      . Digestive Enzyme CAPS Take 1 capsule by mouth daily.      . folic acid (FOLVITE) 0.5 MG tablet Take 0.5 mg by mouth daily.      Marland Kitchen MAGNESIUM CARBONATE PO Take 1-3 tablets by mouth daily.      . Misc Natural Products (TART CHERRY ADVANCED PO) Take 1 tablet by mouth daily.      . Multiple Vitamin (MULTIVITAMIN) tablet Take 1 tablet by mouth daily.      . Omega-3 Fatty Acids (FISH OIL PO) Take 1 tablet by mouth daily.       No current facility-administered medications for this visit.    Past Medical  History  Diagnosis Date  . Chronic fatigue fibromyalgia syndrome     Past Surgical History  Procedure Laterality Date  . Bunionectomy    . Hernia repair    . Tonsillectomy      Family History  Problem Relation Age of Onset  . Heart disease Mother   . Cancer Father   . Heart disease Father     History   Social History  . Marital Status: Married    Spouse Name: N/A    Number of Children: N/A  . Years of Education: N/A   Occupational History  . Not on file.   Social History Main Topics  . Smoking status: Never Smoker   . Smokeless tobacco: Not on file  . Alcohol Use: Not on file  . Drug Use: Not on file  . Sexual Activity: Not on file   Other Topics Concern  . Not on file   Social History Narrative  . No narrative on file    Review of systems: Major complaints are related to fatigue and multiple areas of muscle tightness and discomfort especially in her shoulders and neck, insomnia. The patient specifically denies any chest pain at rest or with exertion, dyspnea at rest or with exertion, orthopnea, paroxysmal nocturnal dyspnea, syncope, palpitations, focal neurological deficits, intermittent claudication, lower extremity edema, unexplained weight gain, cough, hemoptysis or wheezing.  The patient  also denies abdominal pain, nausea, vomiting, dysphagia, diarrhea, constipation, polyuria, polydipsia, dysuria, hematuria, frequency, urgency, abnormal bleeding or bruising, fever, chills, unexpected weight changes, mood swings, change in skin or hair texture, change in voice quality, auditory or visual problems, allergic reactions or rashes.  PHYSICAL EXAM BP 149/77  Pulse 71  Resp 16  Ht '5\' 4"'  (1.626 m)  Wt 64.819 kg (142 lb 14.4 oz)  BMI 24.52 kg/m2  General: Alert, oriented x3, no distress Head: no evidence of trauma, PERRL, EOMI, no exophtalmos or lid lag, no myxedema, no xanthelasma; normal ears, nose and oropharynx Neck: normal jugular venous pulsations and no  hepatojugular reflux; brisk carotid pulses without delay and no carotid bruits Chest: clear to auscultation, no signs of consolidation by percussion or palpation, normal fremitus, symmetrical and full respiratory excursions Cardiovascular: normal position and quality of the apical impulse, regular rhythm with very frequent ectopic beats, normal first and second heart sounds, no murmurs, rubs or gallops Abdomen: no tenderness or distention, no masses by palpation, no abnormal pulsatility or arterial bruits, normal bowel sounds, no hepatosplenomegaly Extremities: no clubbing, cyanosis or edema; 2+ radial, ulnar and brachial pulses bilaterally; 2+ right femoral, posterior tibial and dorsalis pedis pulses; 2+ left femoral, posterior tibial and dorsalis pedis pulses; no subclavian or femoral bruits Neurological: grossly nonfocal   EKG: Normal sinus rhythm, relatively low voltage, 2 PVCs with identical morphology (monophasic left bundle branch block inferior axis transition in lead V3)  Lipid Panel     Component Value Date/Time   CHOL 213* 02/26/2014 1054   TRIG 87.0 02/26/2014 1054   HDL 83.30 02/26/2014 1054   CHOLHDL 3 02/26/2014 1054   VLDL 17.4 02/26/2014 1054   LDLCALC 112* 02/26/2014 1054   LDLDIRECT 128.8 02/28/2011 1011   LDLDIRECT 134.1 04/12/2006 1122    BMET    Component Value Date/Time   NA 139 02/26/2014 1054   K 4.4 02/26/2014 1054   CL 104 02/26/2014 1054   CO2 25 02/26/2014 1054   GLUCOSE 79 02/26/2014 1054   GLUCOSE 79 04/12/2006 1122   BUN 14 02/26/2014 1054   CREATININE 1.1 02/26/2014 1054   CALCIUM 9.5 02/26/2014 1054   GFRNONAA 50* 08/14/2007 1120   GFRAA  Value: >60        The eGFR has been calculated using the MDRD equation. This calculation has not been validated in all clinical 08/14/2007 1120     ASSESSMENT AND PLAN NSVT (nonsustained ventricular tachycardia) Ventricular arrhythmia seems to be quite prevalent, but is completely asymptomatic and has not been associated  with syncope. By physical exam there is no evidence of structural heart disease and her symptoms did not suggest coronary disease, although one cannot exclude congestive heart failure as a contributor to her fatigue. The PVCs appear to have a outflow tract morphology. Need to screen for structural cardiac abnormalities with an echocardiogram and a treadmill stress test. Have also recommended a 24 hour Holter monitor to better quantify the burden of arrhythmia.  If there is no evidence of structural heart disease or malignant arrhythmia, I would favor avoiding any pharmacological therapy. Beta blockers would likely worsen her symptoms of fatigue, which are truly her only complaint.   Orders Placed This Encounter  Procedures  . EKG 12-Lead  . Exercise Tolerance Test  . Holter monitor - 24 hour  . 2D Echocardiogram without contrast   Meds ordered this encounter  Medications  . Omega-3 Fatty Acids (FISH OIL PO)    Sig: Take 1 tablet  by mouth daily.    Holli Humbles, MD, [place] [phone] office [phone] pager

## 2014-04-22 NOTE — Patient Instructions (Signed)
Your physician has recommended that you wear a holter monitor for 24 hours. Holter monitors are medical devices that record the heart's electrical activity. Doctors most often use these monitors to diagnose arrhythmias. Arrhythmias are problems with the speed or rhythm of the heartbeat. The monitor is a small, portable device. You can wear one while you do your normal daily activities. This is usually used to diagnose what is causing palpitations/syncope (passing out).  Your physician has requested that you have an echocardiogram. Echocardiography is a painless test that uses sound waves to create images of your heart. It provides your doctor with information about the size and shape of your heart and how well your heart's chambers and valves are working. This procedure takes approximately one hour. There are no restrictions for this procedure.  Your physician has requested that you have an exercise tolerance test. For further information please visit [email or web address]. Please also follow instruction sheet, as given.  Dr. Sallyanne Kuster recommends that you schedule a follow-up appointment in: After test next available appointment.

## 2014-05-11 ENCOUNTER — Ambulatory Visit: Payer: Medicare HMO | Admitting: Cardiology

## 2014-05-13 ENCOUNTER — Ambulatory Visit: Payer: Medicare HMO | Admitting: Cardiology

## 2014-05-14 ENCOUNTER — Telehealth (HOSPITAL_COMMUNITY): Payer: Self-pay

## 2014-05-14 NOTE — Telephone Encounter (Signed)
Encounter complete. 

## 2014-05-15 ENCOUNTER — Telehealth (HOSPITAL_COMMUNITY): Payer: Self-pay

## 2014-05-15 NOTE — Telephone Encounter (Signed)
Encounter complete. 

## 2014-05-19 ENCOUNTER — Ambulatory Visit (HOSPITAL_BASED_OUTPATIENT_CLINIC_OR_DEPARTMENT_OTHER)
Admission: RE | Admit: 2014-05-19 | Discharge: 2014-05-19 | Disposition: A | Discharge status: Home / Self Care | Payer: Medicare HMO | Source: Ambulatory Visit | Attending: Cardiovascular Disease | Admitting: Cardiovascular Disease

## 2014-05-19 ENCOUNTER — Ambulatory Visit (HOSPITAL_COMMUNITY)
Admission: RE | Admit: 2014-05-19 | Discharge: 2014-05-19 | Disposition: A | Discharge status: Home / Self Care | Payer: Medicare HMO | Source: Ambulatory Visit | Attending: Cardiovascular Disease | Admitting: Cardiovascular Disease

## 2014-05-19 DIAGNOSIS — Z8249 Family history of ischemic heart disease and other diseases of the circulatory system: Secondary | ICD-10-CM | POA: Insufficient documentation

## 2014-05-19 DIAGNOSIS — R9431 Abnormal electrocardiogram [ECG] [EKG]: Secondary | ICD-10-CM | POA: Diagnosis not present

## 2014-05-19 DIAGNOSIS — I369 Nonrheumatic tricuspid valve disorder, unspecified: Secondary | ICD-10-CM

## 2014-05-19 NOTE — Progress Notes (Signed)
2D Echocardiogram Complete.  05/19/2014   Mihail Prettyman [place], [place]

## 2014-05-19 NOTE — Procedures (Signed)
Exercise Treadmill Test    Test  Exercise Tolerance Test Ordering MD: Jerilynn Mages. Croitoru, MD  Interpreting MD:   Unique Test No: 1  Treadmill:  1  Indication for ETT: Palpitaions-Evaluate for Ischemia  Contraindication to ETT: No   Stress Modality: exercise - treadmill  Cardiac Imaging Performed: non   Protocol: standard Bruce - maximal  Max BP:  155/81  Max MPHR (bpm):  147 85% MPR (bpm): 125  MPHR obtained (bpm):  131 % MPHR obtained: 89  Reached 85% MPHR (min:sec):  3:55 Total Exercise Time (min-sec):  6:00  Workload in METS:  7.00 Borg Scale:   Reason ETT Terminated:  dyspnea, fatigue    ST Segment Analysis At Rest: normal ST segments - no evidence of significant ST depression With Exercise: no evidence of significant ST depression  Other Information Arrhythmia:  Yes Angina during ETT:  absent (0) Quality of ETT:  diagnostic  ETT Interpretation:  normal - no evidence of ischemia by ST analysis  Comments: Frequent PVC's and couplets with exercise No diagnostic ST segment changes concerning for ischemia Fair exercise tolerance No chest pain  Pixie Casino, MD, [facility] Attending Cardiologist [place]

## 2014-06-08 ENCOUNTER — Ambulatory Visit: Payer: Medicare HMO | Admitting: Cardiovascular Disease

## 2014-06-29 ENCOUNTER — Telehealth: Payer: Self-pay | Admitting: Internal Medicine

## 2014-06-29 ENCOUNTER — Other Ambulatory Visit: Payer: Self-pay | Admitting: *Deleted

## 2014-06-29 ENCOUNTER — Other Ambulatory Visit (INDEPENDENT_AMBULATORY_CARE_PROVIDER_SITE_OTHER): Payer: Medicare HMO

## 2014-06-29 DIAGNOSIS — R300 Dysuria: Secondary | ICD-10-CM

## 2014-06-29 LAB — POCT URINALYSIS DIPSTICK
Bilirubin, UA: NEGATIVE
GLUCOSE UA: NEGATIVE
KETONES UA: NEGATIVE
NITRITE UA: POSITIVE
Spec Grav, UA: 1.015
Urobilinogen, UA: 0.2
pH, UA: 7

## 2014-06-29 MED ORDER — CIPROFLOXACIN HCL 500 MG PO TABS: 500.0000 mg | ORAL_TABLET | Freq: Two times a day (BID) | ORAL | Status: DC

## 2014-06-29 NOTE — Telephone Encounter (Signed)
Discussed UA results with Dr. Raliegh Ip verbal order for Cipro 500 mg BID x 3 days , Rx sent to pharmacy and pt aware.

## 2014-06-29 NOTE — Telephone Encounter (Signed)
ok 

## 2014-06-29 NOTE — Telephone Encounter (Signed)
Please advise 

## 2014-06-29 NOTE — Telephone Encounter (Signed)
Patient called to f/u up on the below request, she is scheduled for a lab appointment for today at 2:45.

## 2014-06-29 NOTE — Telephone Encounter (Signed)
Pt says she started having UTI sx prior to Christmas. Would like to come in and drop off a urine specimen to be tested if possible.

## 2014-06-29 NOTE — Telephone Encounter (Signed)
Please call pt and schedule lab appointment for today. I put order in for UA.

## 2014-07-10 ENCOUNTER — Other Ambulatory Visit (INDEPENDENT_AMBULATORY_CARE_PROVIDER_SITE_OTHER): Payer: Medicare HMO

## 2014-07-10 ENCOUNTER — Telehealth: Payer: Self-pay | Admitting: Internal Medicine

## 2014-07-10 DIAGNOSIS — R300 Dysuria: Secondary | ICD-10-CM

## 2014-07-10 LAB — POCT URINALYSIS DIPSTICK
Bilirubin, UA: NEGATIVE
Glucose, UA: NEGATIVE
KETONES UA: NEGATIVE
NITRITE UA: NEGATIVE
PH UA: 7
Protein, UA: NEGATIVE
Spec Grav, UA: 1.01
UROBILINOGEN UA: 0.2

## 2014-07-10 MED ORDER — AMOXICILLIN 500 MG PO CAPS: 500.0000 mg | ORAL_CAPSULE | Freq: Three times a day (TID) | ORAL | Status: DC

## 2014-07-10 NOTE — Telephone Encounter (Signed)
Pt has been sch

## 2014-07-10 NOTE — Telephone Encounter (Signed)
Pt presented to the office to give urine specimen, UA done showed Blood: Trace-lysed, Leuk +2. Dr. Raliegh Ip notified verbal order given for Amoxicillin 500 mg TID with meals, # 30 and urine sent for culture. Spoke to pt, told her will start Amoxicillin 500 mg TID with meals and will call with culture results on Monday. Rx sent to pharmacy. Pt verbalized understanding.

## 2014-07-10 NOTE — Telephone Encounter (Signed)
No ans after several rings

## 2014-07-10 NOTE — Telephone Encounter (Signed)
Dr.K, pt's UTI symptoms are back , was treated with Cipro. Please advise.

## 2014-07-10 NOTE — Telephone Encounter (Signed)
Pt would like to come in again for UA to check for uti. Pt stated her symptoms has return. Can I sch UA only?

## 2014-07-10 NOTE — Telephone Encounter (Signed)
Constance Holster, please call pt and schedule lab appointment for today. I put orders in EPIC.

## 2014-07-10 NOTE — Telephone Encounter (Signed)
yes

## 2014-07-13 LAB — URINE CULTURE: Colony Count: >=100000

## 2014-07-14 ENCOUNTER — Other Ambulatory Visit: Payer: Self-pay | Admitting: *Deleted

## 2014-07-14 MED ORDER — NITROFURANTOIN MONOHYD MACRO 100 MG PO CAPS: 100.0000 mg | ORAL_CAPSULE | Freq: Two times a day (BID) | ORAL | Status: DC

## 2014-07-30 ENCOUNTER — Encounter: Payer: Self-pay | Admitting: Cardiovascular Disease

## 2014-07-30 ENCOUNTER — Ambulatory Visit (INDEPENDENT_AMBULATORY_CARE_PROVIDER_SITE_OTHER): Payer: Medicare HMO | Admitting: Cardiovascular Disease

## 2014-07-30 VITALS — BP 98/68 | HR 74 | Resp 16 | Ht 64.0 in | Wt 143.9 lb

## 2014-07-30 DIAGNOSIS — I472 Ventricular tachycardia: Secondary | ICD-10-CM

## 2014-07-30 DIAGNOSIS — I493 Ventricular premature depolarization: Secondary | ICD-10-CM

## 2014-07-30 DIAGNOSIS — I4729 Other ventricular tachycardia: Secondary | ICD-10-CM

## 2014-07-30 NOTE — Progress Notes (Signed)
Patient ID: Dawn Harmon, female   DOB: 1941-04-16, 74 y.o.   MRN: [id number]     Reason for office visit Frequent PVCs, nonsustained VT  Mrs. Dawn Harmon has frequent asymptomatic ventricular ectopy in the setting of what appears to be a structurally normal heart. The arrhythmia was identified incidentally during physical exam. Her echocardiogram and treadmill stress test are normal. A 24-hour Holter monitor performed in October showed a total of 5820 PVCs during the 24-hour period. There are relatively fascicular couplets and occasional brief runs of nonsustained ventricular tachycardia. The longest VT event consisted but only 5 beats at a rate of only 111 bpm. She does not have a history of syncope or palpitations. She remains completely unaware of the arrhythmia. Today in clinic her rhythm is very regular both by physical exam and electrocardiogram.  All of her complaints are related to chronic fatigue syndrome. She denies any shortness of breath at rest with exertion and has never had symptoms of dizziness or presyncope. She denies angina pectoris.   No Known Allergies  Current Outpatient Prescriptions  Medication Sig Dispense Refill  . Acetylcysteine (N-ACETYL-L-CYSTEINE PO) Take 1 capsule by mouth daily.    . Cholecalciferol (VITAMIN D3) 1200 UNIT/15ML LIQD Take 15 mLs by mouth daily.    . Cranberry 300 MG tablet Take 300 mg by mouth daily.    . Cyanocobalamin (VITAMIN B-12) 1000 MCG SUBL Place 1 tablet under the tongue daily.    . Digestive Enzyme CAPS Take 1 capsule by mouth daily.    . folic acid (FOLVITE) 0.5 MG tablet Take 0.5 mg by mouth daily.    Marland Kitchen MAGNESIUM CARBONATE PO Take 1-3 tablets by mouth daily.    . Misc Natural Products (TART CHERRY ADVANCED PO) Take 1 tablet by mouth daily.    . Multiple Vitamin (MULTIVITAMIN) tablet Take 1 tablet by mouth daily.    . Omega-3 Fatty Acids (FISH OIL PO) Take 1 tablet by mouth daily.     No current facility-administered medications for this  visit.    Past Medical History  Diagnosis Date  . Chronic fatigue fibromyalgia syndrome     Past Surgical History  Procedure Laterality Date  . Bunionectomy    . Hernia repair    . Tonsillectomy      Family History  Problem Relation Age of Onset  . Heart disease Mother   . Cancer Father   . Heart disease Father     History   Social History  . Marital Status: Married    Spouse Name: N/A    Number of Children: N/A  . Years of Education: N/A   Occupational History  . Not on file.   Social History Main Topics  . Smoking status: Never Smoker   . Smokeless tobacco: Not on file  . Alcohol Use: Not on file  . Drug Use: Not on file  . Sexual Activity: Not on file   Other Topics Concern  . Not on file   Social History Narrative    Review of systems: The patient specifically denies any chest pain at rest or with exertion, dyspnea at rest or with exertion, orthopnea, paroxysmal nocturnal dyspnea, syncope, palpitations, focal neurological deficits, intermittent claudication, lower extremity edema, unexplained weight gain, cough, hemoptysis or wheezing.  The patient also denies abdominal pain, nausea, vomiting, dysphagia, diarrhea, constipation, polyuria, polydipsia, dysuria, hematuria, frequency, urgency, abnormal bleeding or bruising, fever, chills, unexpected weight changes, mood swings, change in skin or hair texture, change in voice quality, auditory  or visual problems, allergic reactions or rashes, new musculoskeletal complaints other than usual "aches and pains".   PHYSICAL EXAM BP 98/68 mmHg  Pulse 74  Resp 16  Ht _0  (1.626 m)  Wt 143 lb 14.4 oz (65.273 kg)  BMI 24.69 kg/m2  General: Alert, oriented x3, no distress Head: no evidence of trauma, PERRL, EOMI, no exophtalmos or lid lag, no myxedema, no xanthelasma; normal ears, nose and oropharynx Neck: normal jugular venous pulsations and no hepatojugular reflux; brisk carotid pulses without delay and no  carotid bruits Chest: clear to auscultation, no signs of consolidation by percussion or palpation, normal fremitus, symmetrical and full respiratory excursions Cardiovascular: normal position and quality of the apical impulse, regular rhythm, normal first and second heart sounds, no murmurs, rubs or gallops Abdomen: no tenderness or distention, no masses by palpation, no abnormal pulsatility or arterial bruits, normal bowel sounds, no hepatosplenomegaly Extremities: no clubbing, cyanosis or edema; 2+ radial, ulnar and brachial pulses bilaterally; 2+ right femoral, posterior tibial and dorsalis pedis pulses; 2+ left femoral, posterior tibial and dorsalis pedis pulses; no subclavian or femoral bruits Neurological: grossly nonfocal   EKG: Normal sinus rhythm, no repolarization abnormality, rSr' pattern in lead V1 without evidence of epsilon wave, Brugada syndrome or QT prolongation  Lipid Panel     Component Value Date/Time   CHOL 213* 02/26/2014 1054   TRIG 87.0 02/26/2014 1054   TRIG 64 04/12/2006 1122   HDL 83.30 02/26/2014 1054   CHOLHDL 3 02/26/2014 1054   VLDL 17.4 02/26/2014 1054   LDLCALC 112* 02/26/2014 1054   LDLDIRECT 128.8 02/28/2011 1011   LDLDIRECT 134.1 04/12/2006 1122    BMET    Component Value Date/Time   NA 139 02/26/2014 1054   K 4.4 02/26/2014 1054   CL 104 02/26/2014 1054   CO2 25 02/26/2014 1054   GLUCOSE 79 02/26/2014 1054   GLUCOSE 79 04/12/2006 1122   BUN 14 02/26/2014 1054   CREATININE 1.1 02/26/2014 1054   CALCIUM 9.5 02/26/2014 1054   GFRNONAA 50* 08/14/2007 1120   GFRAA  08/14/2007 1120    >60        The eGFR has been calculated using the MDRD equation. This calculation has not been validated in all clinical     ASSESSMENT AND PLAN  Despite moderately high frequency, Mrs. Tirrell PVCs/nonsustained VT are asymptomatic arrhythmias occurring in what appears to be a structurally normal heart. Empirical beta blocker therapy is likely to be poorly  tolerated due to her native low blood pressure. At this point it does not appear to be reason to recommend evaluation for ARVD, sarcoidosis or other unusual causes of ventricular tachycardia. When it does occur, the VT is extremely brief and remarkably slow. I recommend observation without pharmacological therapy. Aggressive evaluation and intervention would be recommended if she develops symptoms of congestive heart failure or syncope.  Holli Humbles, MD, [facility] [phone] office [phone] pager

## 2014-07-30 NOTE — Patient Instructions (Signed)
Dr. Croitoru recommends that you schedule a follow-up appointment in: One year.   

## 2014-08-13 ENCOUNTER — Telehealth (INDEPENDENT_AMBULATORY_CARE_PROVIDER_SITE_OTHER): Payer: Medicare HMO | Admitting: Internal Medicine

## 2014-08-13 DIAGNOSIS — R300 Dysuria: Secondary | ICD-10-CM

## 2014-08-13 NOTE — Telephone Encounter (Signed)
Spoke to pt, c/o dysuria with urination has antibiotics left over but wants to come in a give a urine. Told her okay can come in tomorrow to give a urine, pt verbalized understanding will come in around 11:00. Told her okay will add to lab schedule and put in order.   Dr.K aware of pt's symptoms UA, C&S ordered.

## 2014-08-13 NOTE — Telephone Encounter (Signed)
Pt would like a call back she has some questions about an antibiotic that was left from previous UTI. Pt said she has  another UTI. I think what she wants is  to ask you if she can use medicine from her previous UTI.

## 2014-08-14 ENCOUNTER — Other Ambulatory Visit: Payer: Medicare HMO

## 2014-08-14 ENCOUNTER — Other Ambulatory Visit: Payer: Self-pay | Admitting: *Deleted

## 2014-08-14 DIAGNOSIS — R300 Dysuria: Secondary | ICD-10-CM

## 2014-08-14 LAB — POCT URINALYSIS DIPSTICK
Bilirubin, UA: NEGATIVE
Ketones, UA: NEGATIVE
Nitrite, UA: POSITIVE
SPEC GRAV UA: 1.01
Urobilinogen, UA: 1
pH, UA: 6

## 2014-08-14 MED ORDER — NITROFURANTOIN MONOHYD MACRO 100 MG PO CAPS: 100.0000 mg | ORAL_CAPSULE | Freq: Two times a day (BID) | ORAL | Status: DC

## 2014-08-17 ENCOUNTER — Other Ambulatory Visit: Payer: Self-pay | Admitting: *Deleted

## 2014-08-17 LAB — URINE CULTURE: Colony Count: >=100000

## 2014-08-17 MED ORDER — NITROFURANTOIN MONOHYD MACRO 100 MG PO CAPS: 100.0000 mg | ORAL_CAPSULE | Freq: Two times a day (BID) | ORAL | Status: DC

## 2014-09-15 DIAGNOSIS — G9332 Myalgic encephalomyelitis/chronic fatigue syndrome: Secondary | ICD-10-CM | POA: Insufficient documentation

## 2014-11-25 ENCOUNTER — Telehealth: Payer: Self-pay

## 2014-11-25 NOTE — Telephone Encounter (Signed)
Left a message for pt to return call concerning mammogram.

## 2015-04-05 ENCOUNTER — Encounter: Payer: Self-pay | Admitting: Internal Medicine

## 2015-04-06 ENCOUNTER — Ambulatory Visit (INDEPENDENT_AMBULATORY_CARE_PROVIDER_SITE_OTHER): Payer: Medicare HMO

## 2015-04-06 DIAGNOSIS — Z23 Encounter for immunization: Secondary | ICD-10-CM | POA: Diagnosis not present

## 2015-05-18 DIAGNOSIS — R5383 Other fatigue: Secondary | ICD-10-CM | POA: Diagnosis not present

## 2015-05-18 DIAGNOSIS — M545 Low back pain: Secondary | ICD-10-CM | POA: Diagnosis not present

## 2015-05-18 DIAGNOSIS — R109 Unspecified abdominal pain: Secondary | ICD-10-CM | POA: Diagnosis not present

## 2015-05-31 DIAGNOSIS — M797 Fibromyalgia: Secondary | ICD-10-CM | POA: Diagnosis not present

## 2015-05-31 DIAGNOSIS — R0683 Snoring: Secondary | ICD-10-CM | POA: Diagnosis not present

## 2015-05-31 DIAGNOSIS — G4719 Other hypersomnia: Secondary | ICD-10-CM | POA: Diagnosis not present

## 2015-06-01 DIAGNOSIS — H02834 Dermatochalasis of left upper eyelid: Secondary | ICD-10-CM | POA: Diagnosis not present

## 2015-06-01 DIAGNOSIS — H5203 Hypermetropia, bilateral: Secondary | ICD-10-CM | POA: Diagnosis not present

## 2015-06-01 DIAGNOSIS — H01021 Squamous blepharitis right upper eyelid: Secondary | ICD-10-CM | POA: Diagnosis not present

## 2015-06-01 DIAGNOSIS — H04123 Dry eye syndrome of bilateral lacrimal glands: Secondary | ICD-10-CM | POA: Insufficient documentation

## 2015-06-01 DIAGNOSIS — H2512 Age-related nuclear cataract, left eye: Secondary | ICD-10-CM | POA: Diagnosis not present

## 2015-06-01 DIAGNOSIS — H0102A Squamous blepharitis right eye, upper and lower eyelids: Secondary | ICD-10-CM | POA: Insufficient documentation

## 2015-06-01 DIAGNOSIS — H02831 Dermatochalasis of right upper eyelid: Secondary | ICD-10-CM | POA: Diagnosis not present

## 2015-06-01 DIAGNOSIS — H52203 Unspecified astigmatism, bilateral: Secondary | ICD-10-CM | POA: Insufficient documentation

## 2015-06-01 DIAGNOSIS — H0102B Squamous blepharitis left eye, upper and lower eyelids: Secondary | ICD-10-CM | POA: Insufficient documentation

## 2015-06-01 DIAGNOSIS — H25811 Combined forms of age-related cataract, right eye: Secondary | ICD-10-CM | POA: Diagnosis not present

## 2015-06-01 DIAGNOSIS — H43811 Vitreous degeneration, right eye: Secondary | ICD-10-CM | POA: Diagnosis not present

## 2015-06-01 DIAGNOSIS — H01022 Squamous blepharitis right lower eyelid: Secondary | ICD-10-CM | POA: Diagnosis not present

## 2015-06-01 DIAGNOSIS — H35372 Puckering of macula, left eye: Secondary | ICD-10-CM | POA: Insufficient documentation

## 2015-06-01 DIAGNOSIS — Z1211 Encounter for screening for malignant neoplasm of colon: Secondary | ICD-10-CM | POA: Diagnosis not present

## 2015-08-26 DIAGNOSIS — M47816 Spondylosis without myelopathy or radiculopathy, lumbar region: Secondary | ICD-10-CM | POA: Diagnosis not present

## 2015-08-26 DIAGNOSIS — M9903 Segmental and somatic dysfunction of lumbar region: Secondary | ICD-10-CM | POA: Diagnosis not present

## 2015-08-31 DIAGNOSIS — M9903 Segmental and somatic dysfunction of lumbar region: Secondary | ICD-10-CM | POA: Diagnosis not present

## 2015-08-31 DIAGNOSIS — M47816 Spondylosis without myelopathy or radiculopathy, lumbar region: Secondary | ICD-10-CM | POA: Diagnosis not present

## 2015-09-02 DIAGNOSIS — M47816 Spondylosis without myelopathy or radiculopathy, lumbar region: Secondary | ICD-10-CM | POA: Diagnosis not present

## 2015-09-02 DIAGNOSIS — M9903 Segmental and somatic dysfunction of lumbar region: Secondary | ICD-10-CM | POA: Diagnosis not present

## 2015-09-14 DIAGNOSIS — M9903 Segmental and somatic dysfunction of lumbar region: Secondary | ICD-10-CM | POA: Diagnosis not present

## 2015-09-14 DIAGNOSIS — M47816 Spondylosis without myelopathy or radiculopathy, lumbar region: Secondary | ICD-10-CM | POA: Diagnosis not present

## 2015-09-15 DIAGNOSIS — M9903 Segmental and somatic dysfunction of lumbar region: Secondary | ICD-10-CM | POA: Diagnosis not present

## 2015-09-15 DIAGNOSIS — M47816 Spondylosis without myelopathy or radiculopathy, lumbar region: Secondary | ICD-10-CM | POA: Diagnosis not present

## 2015-09-21 DIAGNOSIS — M47816 Spondylosis without myelopathy or radiculopathy, lumbar region: Secondary | ICD-10-CM | POA: Diagnosis not present

## 2015-09-21 DIAGNOSIS — M9903 Segmental and somatic dysfunction of lumbar region: Secondary | ICD-10-CM | POA: Diagnosis not present

## 2015-09-23 DIAGNOSIS — M47816 Spondylosis without myelopathy or radiculopathy, lumbar region: Secondary | ICD-10-CM | POA: Diagnosis not present

## 2015-09-23 DIAGNOSIS — M9903 Segmental and somatic dysfunction of lumbar region: Secondary | ICD-10-CM | POA: Diagnosis not present

## 2015-09-27 DIAGNOSIS — M9903 Segmental and somatic dysfunction of lumbar region: Secondary | ICD-10-CM | POA: Diagnosis not present

## 2015-09-27 DIAGNOSIS — M47816 Spondylosis without myelopathy or radiculopathy, lumbar region: Secondary | ICD-10-CM | POA: Diagnosis not present

## 2015-09-29 DIAGNOSIS — M9903 Segmental and somatic dysfunction of lumbar region: Secondary | ICD-10-CM | POA: Diagnosis not present

## 2015-09-29 DIAGNOSIS — M47816 Spondylosis without myelopathy or radiculopathy, lumbar region: Secondary | ICD-10-CM | POA: Diagnosis not present

## 2015-10-12 DIAGNOSIS — M9903 Segmental and somatic dysfunction of lumbar region: Secondary | ICD-10-CM | POA: Diagnosis not present

## 2015-10-12 DIAGNOSIS — M47816 Spondylosis without myelopathy or radiculopathy, lumbar region: Secondary | ICD-10-CM | POA: Diagnosis not present

## 2015-10-21 DIAGNOSIS — M47816 Spondylosis without myelopathy or radiculopathy, lumbar region: Secondary | ICD-10-CM | POA: Diagnosis not present

## 2015-10-21 DIAGNOSIS — M9903 Segmental and somatic dysfunction of lumbar region: Secondary | ICD-10-CM | POA: Diagnosis not present

## 2015-10-25 DIAGNOSIS — M9903 Segmental and somatic dysfunction of lumbar region: Secondary | ICD-10-CM | POA: Diagnosis not present

## 2015-10-25 DIAGNOSIS — M47816 Spondylosis without myelopathy or radiculopathy, lumbar region: Secondary | ICD-10-CM | POA: Diagnosis not present

## 2015-11-09 DIAGNOSIS — E162 Hypoglycemia, unspecified: Secondary | ICD-10-CM | POA: Diagnosis not present

## 2015-11-09 DIAGNOSIS — N390 Urinary tract infection, site not specified: Secondary | ICD-10-CM | POA: Diagnosis not present

## 2015-11-09 DIAGNOSIS — Z136 Encounter for screening for cardiovascular disorders: Secondary | ICD-10-CM | POA: Diagnosis not present

## 2015-11-09 DIAGNOSIS — R5382 Chronic fatigue, unspecified: Secondary | ICD-10-CM | POA: Diagnosis not present

## 2015-11-12 DIAGNOSIS — Z136 Encounter for screening for cardiovascular disorders: Secondary | ICD-10-CM | POA: Diagnosis not present

## 2015-11-12 DIAGNOSIS — R5382 Chronic fatigue, unspecified: Secondary | ICD-10-CM | POA: Diagnosis not present

## 2015-11-12 DIAGNOSIS — E559 Vitamin D deficiency, unspecified: Secondary | ICD-10-CM | POA: Diagnosis not present

## 2015-11-12 DIAGNOSIS — E162 Hypoglycemia, unspecified: Secondary | ICD-10-CM | POA: Diagnosis not present

## 2015-11-12 DIAGNOSIS — N189 Chronic kidney disease, unspecified: Secondary | ICD-10-CM | POA: Diagnosis not present

## 2016-02-22 DIAGNOSIS — R69 Illness, unspecified: Secondary | ICD-10-CM | POA: Diagnosis not present

## 2016-04-02 DIAGNOSIS — Z23 Encounter for immunization: Secondary | ICD-10-CM | POA: Diagnosis not present

## 2016-04-11 DIAGNOSIS — M4726 Other spondylosis with radiculopathy, lumbar region: Secondary | ICD-10-CM | POA: Diagnosis not present

## 2016-04-11 DIAGNOSIS — M9903 Segmental and somatic dysfunction of lumbar region: Secondary | ICD-10-CM | POA: Diagnosis not present

## 2016-04-12 DIAGNOSIS — M4726 Other spondylosis with radiculopathy, lumbar region: Secondary | ICD-10-CM | POA: Diagnosis not present

## 2016-04-12 DIAGNOSIS — M9903 Segmental and somatic dysfunction of lumbar region: Secondary | ICD-10-CM | POA: Diagnosis not present

## 2016-04-13 DIAGNOSIS — M9903 Segmental and somatic dysfunction of lumbar region: Secondary | ICD-10-CM | POA: Diagnosis not present

## 2016-04-13 DIAGNOSIS — M4726 Other spondylosis with radiculopathy, lumbar region: Secondary | ICD-10-CM | POA: Diagnosis not present

## 2016-04-17 DIAGNOSIS — M9903 Segmental and somatic dysfunction of lumbar region: Secondary | ICD-10-CM | POA: Diagnosis not present

## 2016-04-17 DIAGNOSIS — M4726 Other spondylosis with radiculopathy, lumbar region: Secondary | ICD-10-CM | POA: Diagnosis not present

## 2016-04-19 DIAGNOSIS — M9903 Segmental and somatic dysfunction of lumbar region: Secondary | ICD-10-CM | POA: Diagnosis not present

## 2016-04-19 DIAGNOSIS — M4726 Other spondylosis with radiculopathy, lumbar region: Secondary | ICD-10-CM | POA: Diagnosis not present

## 2016-04-24 DIAGNOSIS — M4726 Other spondylosis with radiculopathy, lumbar region: Secondary | ICD-10-CM | POA: Diagnosis not present

## 2016-04-24 DIAGNOSIS — M9903 Segmental and somatic dysfunction of lumbar region: Secondary | ICD-10-CM | POA: Diagnosis not present

## 2016-04-26 DIAGNOSIS — M9903 Segmental and somatic dysfunction of lumbar region: Secondary | ICD-10-CM | POA: Diagnosis not present

## 2016-04-26 DIAGNOSIS — M4726 Other spondylosis with radiculopathy, lumbar region: Secondary | ICD-10-CM | POA: Diagnosis not present

## 2016-05-01 DIAGNOSIS — M9903 Segmental and somatic dysfunction of lumbar region: Secondary | ICD-10-CM | POA: Diagnosis not present

## 2016-05-01 DIAGNOSIS — M4726 Other spondylosis with radiculopathy, lumbar region: Secondary | ICD-10-CM | POA: Diagnosis not present

## 2016-05-04 DIAGNOSIS — M4726 Other spondylosis with radiculopathy, lumbar region: Secondary | ICD-10-CM | POA: Diagnosis not present

## 2016-05-04 DIAGNOSIS — M9903 Segmental and somatic dysfunction of lumbar region: Secondary | ICD-10-CM | POA: Diagnosis not present

## 2016-05-08 DIAGNOSIS — M9903 Segmental and somatic dysfunction of lumbar region: Secondary | ICD-10-CM | POA: Diagnosis not present

## 2016-05-08 DIAGNOSIS — M4726 Other spondylosis with radiculopathy, lumbar region: Secondary | ICD-10-CM | POA: Diagnosis not present

## 2016-05-10 DIAGNOSIS — M4726 Other spondylosis with radiculopathy, lumbar region: Secondary | ICD-10-CM | POA: Diagnosis not present

## 2016-05-10 DIAGNOSIS — M9903 Segmental and somatic dysfunction of lumbar region: Secondary | ICD-10-CM | POA: Diagnosis not present

## 2016-05-15 DIAGNOSIS — M9903 Segmental and somatic dysfunction of lumbar region: Secondary | ICD-10-CM | POA: Diagnosis not present

## 2016-05-15 DIAGNOSIS — M4726 Other spondylosis with radiculopathy, lumbar region: Secondary | ICD-10-CM | POA: Diagnosis not present

## 2016-05-17 DIAGNOSIS — M9903 Segmental and somatic dysfunction of lumbar region: Secondary | ICD-10-CM | POA: Diagnosis not present

## 2016-05-17 DIAGNOSIS — M4726 Other spondylosis with radiculopathy, lumbar region: Secondary | ICD-10-CM | POA: Diagnosis not present

## 2016-06-09 DIAGNOSIS — Z1211 Encounter for screening for malignant neoplasm of colon: Secondary | ICD-10-CM | POA: Diagnosis not present

## 2016-06-09 DIAGNOSIS — Z1231 Encounter for screening mammogram for malignant neoplasm of breast: Secondary | ICD-10-CM | POA: Diagnosis not present

## 2016-06-09 DIAGNOSIS — R5382 Chronic fatigue, unspecified: Secondary | ICD-10-CM | POA: Diagnosis not present

## 2016-06-09 DIAGNOSIS — M810 Age-related osteoporosis without current pathological fracture: Secondary | ICD-10-CM | POA: Diagnosis not present

## 2016-06-09 DIAGNOSIS — Z0000 Encounter for general adult medical examination without abnormal findings: Secondary | ICD-10-CM | POA: Diagnosis not present

## 2016-06-09 DIAGNOSIS — N289 Disorder of kidney and ureter, unspecified: Secondary | ICD-10-CM | POA: Diagnosis not present

## 2016-06-16 ENCOUNTER — Other Ambulatory Visit: Payer: Self-pay | Admitting: Family Medicine

## 2016-06-16 DIAGNOSIS — Z1231 Encounter for screening mammogram for malignant neoplasm of breast: Secondary | ICD-10-CM

## 2016-07-13 DIAGNOSIS — H25811 Combined forms of age-related cataract, right eye: Secondary | ICD-10-CM | POA: Diagnosis not present

## 2016-07-13 DIAGNOSIS — H04123 Dry eye syndrome of bilateral lacrimal glands: Secondary | ICD-10-CM | POA: Diagnosis not present

## 2016-07-13 DIAGNOSIS — H5203 Hypermetropia, bilateral: Secondary | ICD-10-CM | POA: Diagnosis not present

## 2016-07-13 DIAGNOSIS — H01022 Squamous blepharitis right lower eyelid: Secondary | ICD-10-CM | POA: Diagnosis not present

## 2016-07-13 DIAGNOSIS — H524 Presbyopia: Secondary | ICD-10-CM | POA: Diagnosis not present

## 2016-07-13 DIAGNOSIS — H02831 Dermatochalasis of right upper eyelid: Secondary | ICD-10-CM | POA: Diagnosis not present

## 2016-07-13 DIAGNOSIS — H01021 Squamous blepharitis right upper eyelid: Secondary | ICD-10-CM | POA: Diagnosis not present

## 2016-07-13 DIAGNOSIS — H01025 Squamous blepharitis left lower eyelid: Secondary | ICD-10-CM | POA: Diagnosis not present

## 2016-07-13 DIAGNOSIS — H43811 Vitreous degeneration, right eye: Secondary | ICD-10-CM | POA: Diagnosis not present

## 2016-07-13 DIAGNOSIS — H1013 Acute atopic conjunctivitis, bilateral: Secondary | ICD-10-CM | POA: Diagnosis not present

## 2016-07-13 DIAGNOSIS — H35372 Puckering of macula, left eye: Secondary | ICD-10-CM | POA: Diagnosis not present

## 2016-07-13 DIAGNOSIS — H52203 Unspecified astigmatism, bilateral: Secondary | ICD-10-CM | POA: Diagnosis not present

## 2016-07-13 DIAGNOSIS — H01024 Squamous blepharitis left upper eyelid: Secondary | ICD-10-CM | POA: Diagnosis not present

## 2016-07-13 DIAGNOSIS — H2512 Age-related nuclear cataract, left eye: Secondary | ICD-10-CM | POA: Diagnosis not present

## 2016-07-13 DIAGNOSIS — H02834 Dermatochalasis of left upper eyelid: Secondary | ICD-10-CM | POA: Diagnosis not present

## 2016-09-05 DIAGNOSIS — M810 Age-related osteoporosis without current pathological fracture: Secondary | ICD-10-CM | POA: Diagnosis not present

## 2016-09-21 ENCOUNTER — Ambulatory Visit
Admission: RE | Admit: 2016-09-21 | Discharge: 2016-09-21 | Disposition: A | Discharge status: Home / Self Care | Payer: Medicare HMO | Source: Ambulatory Visit | Attending: Family Medicine | Admitting: Family Medicine

## 2016-09-21 ENCOUNTER — Other Ambulatory Visit: Payer: Self-pay | Admitting: Family Medicine

## 2016-09-21 DIAGNOSIS — R0789 Other chest pain: Secondary | ICD-10-CM | POA: Diagnosis not present

## 2016-09-21 DIAGNOSIS — R937 Abnormal findings on diagnostic imaging of other parts of musculoskeletal system: Secondary | ICD-10-CM

## 2016-10-03 DIAGNOSIS — Z1211 Encounter for screening for malignant neoplasm of colon: Secondary | ICD-10-CM | POA: Diagnosis not present

## 2016-10-12 DIAGNOSIS — H524 Presbyopia: Secondary | ICD-10-CM | POA: Diagnosis not present

## 2016-10-12 DIAGNOSIS — H01022 Squamous blepharitis right lower eyelid: Secondary | ICD-10-CM | POA: Diagnosis not present

## 2016-10-12 DIAGNOSIS — H02833 Dermatochalasis of right eye, unspecified eyelid: Secondary | ICD-10-CM | POA: Diagnosis not present

## 2016-10-12 DIAGNOSIS — H5203 Hypermetropia, bilateral: Secondary | ICD-10-CM | POA: Diagnosis not present

## 2016-10-12 DIAGNOSIS — H01024 Squamous blepharitis left upper eyelid: Secondary | ICD-10-CM | POA: Diagnosis not present

## 2016-10-12 DIAGNOSIS — H02836 Dermatochalasis of left eye, unspecified eyelid: Secondary | ICD-10-CM | POA: Diagnosis not present

## 2016-10-12 DIAGNOSIS — H2512 Age-related nuclear cataract, left eye: Secondary | ICD-10-CM | POA: Diagnosis not present

## 2016-10-12 DIAGNOSIS — H35372 Puckering of macula, left eye: Secondary | ICD-10-CM | POA: Diagnosis not present

## 2016-10-12 DIAGNOSIS — H01025 Squamous blepharitis left lower eyelid: Secondary | ICD-10-CM | POA: Diagnosis not present

## 2016-10-12 DIAGNOSIS — H25811 Combined forms of age-related cataract, right eye: Secondary | ICD-10-CM | POA: Diagnosis not present

## 2016-10-12 DIAGNOSIS — H01021 Squamous blepharitis right upper eyelid: Secondary | ICD-10-CM | POA: Diagnosis not present

## 2016-10-12 DIAGNOSIS — H43811 Vitreous degeneration, right eye: Secondary | ICD-10-CM | POA: Diagnosis not present

## 2016-10-12 DIAGNOSIS — H04123 Dry eye syndrome of bilateral lacrimal glands: Secondary | ICD-10-CM | POA: Diagnosis not present

## 2016-10-23 DIAGNOSIS — R69 Illness, unspecified: Secondary | ICD-10-CM | POA: Diagnosis not present

## 2016-10-30 DIAGNOSIS — R69 Illness, unspecified: Secondary | ICD-10-CM | POA: Diagnosis not present

## 2016-11-06 DIAGNOSIS — R69 Illness, unspecified: Secondary | ICD-10-CM | POA: Diagnosis not present

## 2016-12-01 DIAGNOSIS — R69 Illness, unspecified: Secondary | ICD-10-CM | POA: Diagnosis not present

## 2016-12-11 DIAGNOSIS — R69 Illness, unspecified: Secondary | ICD-10-CM | POA: Diagnosis not present

## 2017-01-02 DIAGNOSIS — R29818 Other symptoms and signs involving the nervous system: Secondary | ICD-10-CM | POA: Diagnosis not present

## 2017-01-02 DIAGNOSIS — R69 Illness, unspecified: Secondary | ICD-10-CM | POA: Diagnosis not present

## 2017-01-02 DIAGNOSIS — R5382 Chronic fatigue, unspecified: Secondary | ICD-10-CM | POA: Diagnosis not present

## 2017-01-02 DIAGNOSIS — N183 Chronic kidney disease, stage 3 (moderate): Secondary | ICD-10-CM | POA: Diagnosis not present

## 2017-01-02 DIAGNOSIS — M62838 Other muscle spasm: Secondary | ICD-10-CM | POA: Diagnosis not present

## 2017-01-05 ENCOUNTER — Other Ambulatory Visit: Payer: Self-pay | Admitting: Family Medicine

## 2017-01-05 DIAGNOSIS — R29818 Other symptoms and signs involving the nervous system: Secondary | ICD-10-CM

## 2017-01-12 ENCOUNTER — Ambulatory Visit
Admission: RE | Admit: 2017-01-12 | Discharge: 2017-01-12 | Disposition: A | Discharge status: Home / Self Care | Payer: Medicare HMO | Source: Ambulatory Visit | Attending: Family Medicine | Admitting: Family Medicine

## 2017-01-12 DIAGNOSIS — R42 Dizziness and giddiness: Secondary | ICD-10-CM | POA: Diagnosis not present

## 2017-01-12 DIAGNOSIS — R29818 Other symptoms and signs involving the nervous system: Secondary | ICD-10-CM

## 2017-01-12 DIAGNOSIS — R269 Unspecified abnormalities of gait and mobility: Secondary | ICD-10-CM | POA: Diagnosis not present

## 2017-01-12 MED ORDER — GADOBENATE DIMEGLUMINE 529 MG/ML IV SOLN
13.0000 mL | Freq: Once | INTRAVENOUS | Status: AC | PRN
  Administered 2017-01-12: 13 mL via INTRAVENOUS

## 2017-01-23 ENCOUNTER — Ambulatory Visit
Admission: RE | Admit: 2017-01-23 | Discharge: 2017-01-23 | Disposition: A | Discharge status: Home / Self Care | Payer: Medicare HMO | Source: Ambulatory Visit | Attending: Family Medicine | Admitting: Family Medicine

## 2017-01-23 DIAGNOSIS — Z1231 Encounter for screening mammogram for malignant neoplasm of breast: Secondary | ICD-10-CM | POA: Diagnosis not present

## 2017-01-24 ENCOUNTER — Other Ambulatory Visit: Payer: Self-pay | Admitting: Family Medicine

## 2017-01-24 DIAGNOSIS — R928 Other abnormal and inconclusive findings on diagnostic imaging of breast: Secondary | ICD-10-CM

## 2017-02-01 ENCOUNTER — Ambulatory Visit: Admission: RE | Admit: 2017-02-01 | Payer: Medicare HMO | Source: Ambulatory Visit

## 2017-02-01 ENCOUNTER — Ambulatory Visit
Admission: RE | Admit: 2017-02-01 | Discharge: 2017-02-01 | Disposition: A | Discharge status: Home / Self Care | Payer: Medicare HMO | Source: Ambulatory Visit | Attending: Family Medicine | Admitting: Family Medicine

## 2017-02-01 DIAGNOSIS — R928 Other abnormal and inconclusive findings on diagnostic imaging of breast: Secondary | ICD-10-CM | POA: Diagnosis not present

## 2017-02-19 ENCOUNTER — Encounter: Payer: Self-pay | Admitting: Neurology

## 2017-02-19 ENCOUNTER — Ambulatory Visit (INDEPENDENT_AMBULATORY_CARE_PROVIDER_SITE_OTHER): Payer: Medicare HMO | Admitting: Neurology

## 2017-02-19 VITALS — BP 119/83 | HR 64 | Ht 64.0 in | Wt 146.0 lb

## 2017-02-19 DIAGNOSIS — M542 Cervicalgia: Secondary | ICD-10-CM | POA: Diagnosis not present

## 2017-02-19 DIAGNOSIS — G8929 Other chronic pain: Secondary | ICD-10-CM | POA: Diagnosis not present

## 2017-02-19 DIAGNOSIS — R2689 Other abnormalities of gait and mobility: Secondary | ICD-10-CM

## 2017-02-19 MED ORDER — DULOXETINE HCL 30 MG PO CPEP: 30.0000 mg | ORAL_CAPSULE | Freq: Every day | ORAL | 11 refills | Status: DC

## 2017-02-19 NOTE — Progress Notes (Signed)
PATIENT: Dawn Harmon DOB: 05/27/1941  Chief Complaint  Patient presents with  . Difficulty with balance    Reports episodes of feeling off balance, weak and shaky for several weeks in June 2018. She was veering into walls when trying to walk.  Since that time, her symptoms have improved but she decided to keep this appointment to make sure she did not need further testing.  She also underwent a normal brain MRI.  She is taking Zoloft for depression.  She has been evaluated in the past for chronic fatigue.   Marland Kitchen PCP    Harlan Stains, MD     HISTORICAL  Dawn Harmon is a 76 year old right-handed female , seen in refer by  her primary care physician Dr. Dema Severin, Cynthia,for evaluation with balance, initial evaluation was February 19 2017,   I reviewed and summarized the referring note, she had a history of chronic fatigue/insomnia, vitamin D deficiency, depression, recurrent UTI,  mild mitral valve regurgitations, osteoporosis,  She complains of chronic fatigue, diffuse body ache you pain for many years, gradually getting worse, especially since 2017 she complains of intense bilateral shoulder muscle tightness, to the point of nausea, lack of appetite. Difficulty focusing, frequent headaches,  She also had a history of chronic insomnia, difficulties falling to sleep, maintaining sleep at nighttime,  She also complains of dizziness since July 2018, already has some baseline gait abnormality, but she contributed to her balance issues, when she walked, she has to hold on to furniture, but since July 2018, she experience different kind of dizziness, she described transient loss of balance as she get up quickly from seated position, felt a where he unstable, lightheadedness, lasting for few minutes, she has no appetite, try to drink 32 oz water daily.  I have personally reviewed MRI of the brain with and without contrast in July 2018 that was normal. REVIEW OF SYSTEMS: Full 14 system review of  systems performed and notable only for fatigue, blurred vision, numbness, insomnia or depression not enough sleep  ALLERGIES: Allergies  Allergen Reactions  . Levofloxacin Other (See Comments)    insomnia    HOME MEDICATIONS: Current Outpatient Prescriptions  Medication Sig Dispense Refill  . Cholecalciferol (VITAMIN D PO) Take by mouth daily.    . Coenzyme Q10 (CO Q 10 PO) Take by mouth daily.    . Cranberry 300 MG tablet Take 300 mg by mouth daily.    . Multiple Vitamin (MULTIVITAMIN) tablet Take 1 tablet by mouth daily.    . Omega-3 Fatty Acids (FISH OIL PO) Take 1 tablet by mouth daily.    . sertraline (ZOLOFT) 25 MG tablet Take 50 mg by mouth daily.    . TURMERIC PO Take by mouth daily.    Marland Kitchen VITAMIN K PO Take by mouth daily.     No current facility-administered medications for this visit.     PAST MEDICAL HISTORY: Past Medical History:  Diagnosis Date  . Chronic fatigue fibromyalgia syndrome   . CKD (chronic kidney disease)   . Depression   . Insomnia   . Osteoarthritis    neck  . Osteoporosis     PAST SURGICAL HISTORY: Past Surgical History:  Procedure Laterality Date  . BUNIONECTOMY    . HERNIA REPAIR    . TONSILLECTOMY      FAMILY HISTORY: Family History  Problem Relation Age of Onset  . Breast cancer Mother   . Suicidality Father   . Arthritis Father   . Depression  Father   . Breast cancer Sister     SOCIAL HISTORY:  Social History   Social History  . Marital status: Married    Spouse name: N/A  . Number of children: 2  . Years of education: HS   Occupational History  . Retired    Social History Main Topics  . Smoking status: Never Smoker  . Smokeless tobacco: Never Used  . Alcohol use Yes     Comment: May have one glass of wine  . Drug use: No  . Sexual activity: Not on file   Other Topics Concern  . Not on file   Social History Narrative   Lives at home with her husband.   Right-handed.   1-2 cups caffeine per day.         PHYSICAL EXAM   Vitals:   02/19/17 1124  BP: 119/83  Pulse: 64  Weight: 146 lb (66.2 kg)  Height: 5\' 4"  (1.626 m)    Not recorded      Body mass index is 25.06 kg/m.  PHYSICAL EXAMNIATION:  Gen: NAD, conversant, well nourised, obese, well groomed                     Cardiovascular: Regular rate rhythm, no peripheral edema, warm, nontender. Eyes: Conjunctivae clear without exudates or hemorrhage Neck: Supple, no carotid bruits. Pulmonary: Clear to auscultation bilaterally   NEUROLOGICAL EXAM:  MENTAL STATUS: Speech:    Speech is normal; fluent and spontaneous with normal comprehension.  Cognition:     Orientation to time, place and person     Normal recent and remote memory     Normal Attention span and concentration     Normal Language, naming, repeating,spontaneous speech     Fund of knowledge   CRANIAL NERVES: CN II: Visual fields are full to confrontation. Fundoscopic exam is normal with sharp discs and no vascular changes. Pupils are round equal and briskly reactive to light. CN III, IV, VI: extraocular movement are normal. No ptosis. CN V: Facial sensation is intact to pinprick in all 3 divisions bilaterally. Corneal responses are intact.  CN VII: Face is symmetric with normal eye closure and smile. CN VIII: Hearing is normal to rubbing fingers CN IX, X: Palate elevates symmetrically. Phonation is normal. CN XI: Head turning and shoulder shrug are intact CN XII: Tongue is midline with normal movements and no atrophy.  MOTOR: There is no pronator drift of out-stretched arms. Muscle bulk and tone are normal. Muscle strength is normal.  REFLEXES: Reflexes are 2+ and symmetric at the biceps, triceps, knees, and ankles. Plantar responses are flexor.  SENSORY: Intact to light touch, pinprick, positional sensation and vibratory sensation are intact in fingers and toes.  COORDINATION: Rapid alternating movements and fine finger movements are intact. There  is no dysmetria on finger-to-nose and heel-knee-shin.    GAIT/STANCE: Mildly unsteady gait   DIAGNOSTIC DATA (LABS, IMAGING, TESTING) - I reviewed patient records, labs, notes, testing and imaging myself where available.   ASSESSMENT AND PLAN  Dawn Harmon is a 76 y.o. female   Transient dizziness with sudden positional change, mildly unsteady gait  Risk reflex on examinations, need to rule out cervical spondylitic myelopathy  Likely a component of orthostatic blood pressure changes,   Advised to keep well hydration  Diffuse body achy pain  Cymbalta 30 mg daily  Laboratory evaluations from her primary care's office  Marcial Pacas, M.D. Ph.D.  [facility] [address]  [place], [place] [address] Ph: [phone] Fax: [phone]  CC: Harlan Stains, MD

## 2017-02-20 DIAGNOSIS — R69 Illness, unspecified: Secondary | ICD-10-CM | POA: Diagnosis not present

## 2017-03-08 ENCOUNTER — Other Ambulatory Visit: Payer: Medicare HMO

## 2017-03-12 ENCOUNTER — Other Ambulatory Visit: Payer: Medicare HMO

## 2017-03-12 ENCOUNTER — Telehealth: Payer: Self-pay | Admitting: Neurology

## 2017-03-12 NOTE — Telephone Encounter (Signed)
Patient is scheduled to have her MRI Cervical done at [facility] today 03/12/17 at 10:00 AM. But I just received a fax on that it has not been approved and it is pending a peer to peer. The phone number for the peer to peer is [phone] select option 4. The case number is [id number]. This case does close on Thursday 03/15/17.

## 2017-03-12 NOTE — Telephone Encounter (Signed)
Dr. Ernestine Mcmurray will return the call to complete the peer-to-peer.  He will call on 03/13/17 at 10:15am on Dr. Rhea Belton mobile number.

## 2017-03-13 NOTE — Telephone Encounter (Signed)
Noted, thank you

## 2017-03-13 NOTE — Telephone Encounter (Signed)
MRI cervical wo  preauthorization Code  [id number]

## 2017-03-30 ENCOUNTER — Ambulatory Visit
Admission: RE | Admit: 2017-03-30 | Discharge: 2017-03-30 | Disposition: A | Discharge status: Home / Self Care | Payer: Medicare HMO | Source: Ambulatory Visit | Attending: Neurology | Admitting: Neurology

## 2017-03-30 ENCOUNTER — Other Ambulatory Visit: Payer: Medicare HMO

## 2017-03-30 DIAGNOSIS — G8929 Other chronic pain: Secondary | ICD-10-CM

## 2017-03-30 DIAGNOSIS — M542 Cervicalgia: Secondary | ICD-10-CM | POA: Diagnosis not present

## 2017-03-30 DIAGNOSIS — R2689 Other abnormalities of gait and mobility: Secondary | ICD-10-CM

## 2017-03-30 DIAGNOSIS — M50223 Other cervical disc displacement at C6-C7 level: Secondary | ICD-10-CM | POA: Diagnosis not present

## 2017-04-08 DIAGNOSIS — Z23 Encounter for immunization: Secondary | ICD-10-CM | POA: Diagnosis not present

## 2017-05-28 ENCOUNTER — Ambulatory Visit: Payer: Medicare HMO | Admitting: Neurology

## 2017-05-29 DIAGNOSIS — S4351XA Sprain of right acromioclavicular joint, initial encounter: Secondary | ICD-10-CM | POA: Diagnosis not present

## 2017-05-29 DIAGNOSIS — M9901 Segmental and somatic dysfunction of cervical region: Secondary | ICD-10-CM | POA: Diagnosis not present

## 2017-05-29 DIAGNOSIS — M4722 Other spondylosis with radiculopathy, cervical region: Secondary | ICD-10-CM | POA: Diagnosis not present

## 2017-05-29 DIAGNOSIS — M9907 Segmental and somatic dysfunction of upper extremity: Secondary | ICD-10-CM | POA: Diagnosis not present

## 2017-05-30 DIAGNOSIS — S4351XA Sprain of right acromioclavicular joint, initial encounter: Secondary | ICD-10-CM | POA: Diagnosis not present

## 2017-05-30 DIAGNOSIS — M9907 Segmental and somatic dysfunction of upper extremity: Secondary | ICD-10-CM | POA: Diagnosis not present

## 2017-05-30 DIAGNOSIS — M4722 Other spondylosis with radiculopathy, cervical region: Secondary | ICD-10-CM | POA: Diagnosis not present

## 2017-05-30 DIAGNOSIS — M9901 Segmental and somatic dysfunction of cervical region: Secondary | ICD-10-CM | POA: Diagnosis not present

## 2017-05-31 DIAGNOSIS — M4722 Other spondylosis with radiculopathy, cervical region: Secondary | ICD-10-CM | POA: Diagnosis not present

## 2017-05-31 DIAGNOSIS — M9901 Segmental and somatic dysfunction of cervical region: Secondary | ICD-10-CM | POA: Diagnosis not present

## 2017-05-31 DIAGNOSIS — M9907 Segmental and somatic dysfunction of upper extremity: Secondary | ICD-10-CM | POA: Diagnosis not present

## 2017-05-31 DIAGNOSIS — S4351XA Sprain of right acromioclavicular joint, initial encounter: Secondary | ICD-10-CM | POA: Diagnosis not present

## 2017-06-07 DIAGNOSIS — M9907 Segmental and somatic dysfunction of upper extremity: Secondary | ICD-10-CM | POA: Diagnosis not present

## 2017-06-07 DIAGNOSIS — M9901 Segmental and somatic dysfunction of cervical region: Secondary | ICD-10-CM | POA: Diagnosis not present

## 2017-06-07 DIAGNOSIS — M4722 Other spondylosis with radiculopathy, cervical region: Secondary | ICD-10-CM | POA: Diagnosis not present

## 2017-06-07 DIAGNOSIS — S4351XA Sprain of right acromioclavicular joint, initial encounter: Secondary | ICD-10-CM | POA: Diagnosis not present

## 2017-06-13 DIAGNOSIS — R42 Dizziness and giddiness: Secondary | ICD-10-CM | POA: Diagnosis not present

## 2017-06-13 DIAGNOSIS — M47812 Spondylosis without myelopathy or radiculopathy, cervical region: Secondary | ICD-10-CM | POA: Diagnosis not present

## 2017-06-13 DIAGNOSIS — M810 Age-related osteoporosis without current pathological fracture: Secondary | ICD-10-CM | POA: Diagnosis not present

## 2017-06-13 DIAGNOSIS — M9901 Segmental and somatic dysfunction of cervical region: Secondary | ICD-10-CM | POA: Diagnosis not present

## 2017-06-13 DIAGNOSIS — Z0000 Encounter for general adult medical examination without abnormal findings: Secondary | ICD-10-CM | POA: Diagnosis not present

## 2017-06-13 DIAGNOSIS — S4351XA Sprain of right acromioclavicular joint, initial encounter: Secondary | ICD-10-CM | POA: Diagnosis not present

## 2017-06-13 DIAGNOSIS — N183 Chronic kidney disease, stage 3 (moderate): Secondary | ICD-10-CM | POA: Diagnosis not present

## 2017-06-13 DIAGNOSIS — R5382 Chronic fatigue, unspecified: Secondary | ICD-10-CM | POA: Diagnosis not present

## 2017-06-13 DIAGNOSIS — M4722 Other spondylosis with radiculopathy, cervical region: Secondary | ICD-10-CM | POA: Diagnosis not present

## 2017-06-13 DIAGNOSIS — R69 Illness, unspecified: Secondary | ICD-10-CM | POA: Diagnosis not present

## 2017-06-13 DIAGNOSIS — M9907 Segmental and somatic dysfunction of upper extremity: Secondary | ICD-10-CM | POA: Diagnosis not present

## 2017-06-19 DIAGNOSIS — M9901 Segmental and somatic dysfunction of cervical region: Secondary | ICD-10-CM | POA: Diagnosis not present

## 2017-06-19 DIAGNOSIS — M4722 Other spondylosis with radiculopathy, cervical region: Secondary | ICD-10-CM | POA: Diagnosis not present

## 2017-06-19 DIAGNOSIS — S4351XA Sprain of right acromioclavicular joint, initial encounter: Secondary | ICD-10-CM | POA: Diagnosis not present

## 2017-06-19 DIAGNOSIS — M9907 Segmental and somatic dysfunction of upper extremity: Secondary | ICD-10-CM | POA: Diagnosis not present

## 2017-06-20 DIAGNOSIS — M9901 Segmental and somatic dysfunction of cervical region: Secondary | ICD-10-CM | POA: Diagnosis not present

## 2017-06-20 DIAGNOSIS — M4722 Other spondylosis with radiculopathy, cervical region: Secondary | ICD-10-CM | POA: Diagnosis not present

## 2017-06-20 DIAGNOSIS — R69 Illness, unspecified: Secondary | ICD-10-CM | POA: Diagnosis not present

## 2017-06-20 DIAGNOSIS — S4351XA Sprain of right acromioclavicular joint, initial encounter: Secondary | ICD-10-CM | POA: Diagnosis not present

## 2017-06-20 DIAGNOSIS — M9907 Segmental and somatic dysfunction of upper extremity: Secondary | ICD-10-CM | POA: Diagnosis not present

## 2017-06-27 DIAGNOSIS — S46911A Strain of unspecified muscle, fascia and tendon at shoulder and upper arm level, right arm, initial encounter: Secondary | ICD-10-CM | POA: Diagnosis not present

## 2017-07-09 ENCOUNTER — Encounter (INDEPENDENT_AMBULATORY_CARE_PROVIDER_SITE_OTHER): Payer: Self-pay | Admitting: Orthopedic Surgery

## 2017-07-09 ENCOUNTER — Ambulatory Visit (INDEPENDENT_AMBULATORY_CARE_PROVIDER_SITE_OTHER): Payer: Medicare HMO | Admitting: Orthopedic Surgery

## 2017-07-09 DIAGNOSIS — M7531 Calcific tendinitis of right shoulder: Secondary | ICD-10-CM

## 2017-07-09 NOTE — Progress Notes (Signed)
Office Visit Note   Patient: Dawn Harmon           Date of Birth: 02/14/1941           MRN: [id number] Visit Date: 07/09/2017 Requested by: Maude Leriche, PA-C [address] [place], [place] [address] PCP: Harlan Stains, MD  Subjective: Chief Complaint  Patient presents with  . Right Shoulder - Pain    HPI: Dawn Harmon is a patient with right shoulder pain of 4 weeks duration.  Denies any history of injury.  She states that she has had pain for 2 months.  She does have a history of remote dislocation of the right shoulder when she was in her 52s.  She had very severe pain in November but that has improved.  Ibuprofen eased her pain significantly.  She had been treated for a pinched nerve in her neck by a chiropractor.  She states that she has pain primarily in the deltoid region of her right shoulder.  Reports some numbness and tingling digits 4 and 5 of the right hand but no real radicular type symptoms.  No weakness in the right arm.  She is not having much in the way of neck pain.  Outside radiographs are reviewed.  The patient has moderate glenohumeral arthritis with no narrowing of the acromiohumeral distance.  There is also calcification at the supraspinatus attachment site.  Neck radiographs show significant arthritis at C5-6 as well as some facet arthritis.  MRI of the C-spine from 2012 is reviewed.  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              ROS: All systems reviewed are negative as they relate to the chief complaint within the history of present illness.  Patient denies  fevers or chills.   Assessment & Plan: Visit Diagnoses:  1. Calcific tendinitis of right shoulder     Plan: Impression is 3 potential pain generators affecting her right shoulder girdle region.  She was not really having any symptoms in the early fall.  I think most likely this represents calcific tendinitis which is symptomatic in the right shoulder which has become less  symptomatic with initiation of nonsteroidal anti-inflammatories.  Currently she can live with the amount of symptoms that she has.  She also has the potential pain generator of moderate glenohumeral arthritis but her range of motion is excellent and she had this arthritis before 2 months ago.  Third possibility is that this is coming from her neck.  Again no definite paresthesias or radicular symptoms and by history she states that her current symptoms are not like what she had several years ago when her neck was acting up.  No intervention for now but we will see her back in 8 weeks for clinical recheck and consideration of subacromial injection if the calcific tendinitis becomes more symptomatic.  Discussed the natural history of this disease process with her today.  Follow-Up Instructions: Return in about 8 weeks (around 09/03/2017).   Orders:  No orders of the defined types were placed in this encounter.  No orders of the defined types were placed in this encounter.     Procedures: No procedures performed   Clinical Data: No additional findings.  Objective: Vital Signs: There were no vitals taken for this visit.  Physical Exam:   Constitutional: Patient appears well-developed HEENT:  Head: Normocephalic Eyes:EOM are normal Neck: Normal range of motion Cardiovascular: Normal rate Pulmonary/chest: Effort normal Neurologic: Patient  is alert Skin: Skin is warm Psychiatric: Patient has normal mood and affect    Ortho Exam: Orthopedic exam demonstrates pretty good cervical spine range of motion with 5 out of 5 grip EPL FPL interosseous wrist flexion wrist extension biceps triceps and deltoid strength bilaterally.  Radial pulses intact bilaterally.  No definite paresthesias C5-T1 although she does report some subjective numbness in digits 4 and 5 on the right-hand side.  Negative Tinel's cubital tunnel in this region.  She has with her shoulder excellent rotator cuff strength on the  right left-hand side to infraspinatus supraspinatus and subscap muscle testing along with negative apprehension relocation testing on the right.  No popping or coarse grinding with passive range of motion of the right shoulder.  She does have some pain with abduction and positive impingement signs on the right negative on the left.  No other masses lymph adenopathy or skin changes noted in the shoulder girdle region  Specialty Comments:  No specialty comments available.  Imaging: No results found.   PMFS History: Patient Active Problem List   Diagnosis Date Noted  . Balance disorder 02/19/2017  . Chronic neck pain 02/19/2017  . PVC (premature ventricular contraction) 04/22/2014  . NSVT (nonsustained ventricular tachycardia) ([facility]) 04/22/2014  . Osteoarthritis 03/05/2014  . Cervical radiculopathy 03/10/2011  . DEPRESSION 03/12/2007   Past Medical History:  Diagnosis Date  . Chronic fatigue fibromyalgia syndrome   . CKD (chronic kidney disease)   . Depression   . Insomnia   . Osteoarthritis    neck  . Osteoporosis     Family History  Problem Relation Age of Onset  . Breast cancer Mother   . Suicidality Father   . Arthritis Father   . Depression Father   . Breast cancer Sister     Past Surgical History:  Procedure Laterality Date  . BUNIONECTOMY    . HERNIA REPAIR    . TONSILLECTOMY     Social History   Occupational History  . Occupation: Retired  Tobacco Use  . Smoking status: Never Smoker  . Smokeless tobacco: Never Used  Substance and Sexual Activity  . Alcohol use: Yes    Comment: May have one glass of wine  . Drug use: No  . Sexual activity: Not on file

## 2017-07-12 DIAGNOSIS — M9907 Segmental and somatic dysfunction of upper extremity: Secondary | ICD-10-CM | POA: Diagnosis not present

## 2017-07-12 DIAGNOSIS — M4722 Other spondylosis with radiculopathy, cervical region: Secondary | ICD-10-CM | POA: Diagnosis not present

## 2017-07-12 DIAGNOSIS — M9901 Segmental and somatic dysfunction of cervical region: Secondary | ICD-10-CM | POA: Diagnosis not present

## 2017-07-12 DIAGNOSIS — S4351XA Sprain of right acromioclavicular joint, initial encounter: Secondary | ICD-10-CM | POA: Diagnosis not present

## 2017-07-18 DIAGNOSIS — M4722 Other spondylosis with radiculopathy, cervical region: Secondary | ICD-10-CM | POA: Diagnosis not present

## 2017-07-18 DIAGNOSIS — M9901 Segmental and somatic dysfunction of cervical region: Secondary | ICD-10-CM | POA: Diagnosis not present

## 2017-07-18 DIAGNOSIS — S4351XA Sprain of right acromioclavicular joint, initial encounter: Secondary | ICD-10-CM | POA: Diagnosis not present

## 2017-07-18 DIAGNOSIS — M9907 Segmental and somatic dysfunction of upper extremity: Secondary | ICD-10-CM | POA: Diagnosis not present

## 2017-07-20 DIAGNOSIS — H2513 Age-related nuclear cataract, bilateral: Secondary | ICD-10-CM | POA: Diagnosis not present

## 2017-07-20 DIAGNOSIS — H40013 Open angle with borderline findings, low risk, bilateral: Secondary | ICD-10-CM | POA: Diagnosis not present

## 2017-07-20 DIAGNOSIS — H25011 Cortical age-related cataract, right eye: Secondary | ICD-10-CM | POA: Diagnosis not present

## 2017-07-31 DIAGNOSIS — M9907 Segmental and somatic dysfunction of upper extremity: Secondary | ICD-10-CM | POA: Diagnosis not present

## 2017-07-31 DIAGNOSIS — M9901 Segmental and somatic dysfunction of cervical region: Secondary | ICD-10-CM | POA: Diagnosis not present

## 2017-07-31 DIAGNOSIS — M4722 Other spondylosis with radiculopathy, cervical region: Secondary | ICD-10-CM | POA: Diagnosis not present

## 2017-07-31 DIAGNOSIS — S4351XA Sprain of right acromioclavicular joint, initial encounter: Secondary | ICD-10-CM | POA: Diagnosis not present

## 2017-08-23 DIAGNOSIS — H2511 Age-related nuclear cataract, right eye: Secondary | ICD-10-CM | POA: Diagnosis not present

## 2017-08-23 DIAGNOSIS — M9901 Segmental and somatic dysfunction of cervical region: Secondary | ICD-10-CM | POA: Diagnosis not present

## 2017-08-23 DIAGNOSIS — M9907 Segmental and somatic dysfunction of upper extremity: Secondary | ICD-10-CM | POA: Diagnosis not present

## 2017-08-23 DIAGNOSIS — H2513 Age-related nuclear cataract, bilateral: Secondary | ICD-10-CM | POA: Diagnosis not present

## 2017-08-23 DIAGNOSIS — M4722 Other spondylosis with radiculopathy, cervical region: Secondary | ICD-10-CM | POA: Diagnosis not present

## 2017-08-23 DIAGNOSIS — S4351XA Sprain of right acromioclavicular joint, initial encounter: Secondary | ICD-10-CM | POA: Diagnosis not present

## 2017-08-23 DIAGNOSIS — H25013 Cortical age-related cataract, bilateral: Secondary | ICD-10-CM | POA: Diagnosis not present

## 2017-08-23 DIAGNOSIS — H35033 Hypertensive retinopathy, bilateral: Secondary | ICD-10-CM | POA: Diagnosis not present

## 2017-08-23 DIAGNOSIS — H40013 Open angle with borderline findings, low risk, bilateral: Secondary | ICD-10-CM | POA: Diagnosis not present

## 2017-08-23 DIAGNOSIS — H25011 Cortical age-related cataract, right eye: Secondary | ICD-10-CM | POA: Diagnosis not present

## 2017-08-28 DIAGNOSIS — S4351XA Sprain of right acromioclavicular joint, initial encounter: Secondary | ICD-10-CM | POA: Diagnosis not present

## 2017-08-28 DIAGNOSIS — M4722 Other spondylosis with radiculopathy, cervical region: Secondary | ICD-10-CM | POA: Diagnosis not present

## 2017-08-28 DIAGNOSIS — M9901 Segmental and somatic dysfunction of cervical region: Secondary | ICD-10-CM | POA: Diagnosis not present

## 2017-08-28 DIAGNOSIS — M9907 Segmental and somatic dysfunction of upper extremity: Secondary | ICD-10-CM | POA: Diagnosis not present

## 2017-09-11 DIAGNOSIS — J45909 Unspecified asthma, uncomplicated: Secondary | ICD-10-CM | POA: Diagnosis not present

## 2017-09-11 DIAGNOSIS — R05 Cough: Secondary | ICD-10-CM | POA: Diagnosis not present

## 2017-09-11 DIAGNOSIS — J069 Acute upper respiratory infection, unspecified: Secondary | ICD-10-CM | POA: Diagnosis not present

## 2017-09-20 DIAGNOSIS — R5382 Chronic fatigue, unspecified: Secondary | ICD-10-CM | POA: Diagnosis not present

## 2017-09-20 DIAGNOSIS — R69 Illness, unspecified: Secondary | ICD-10-CM | POA: Diagnosis not present

## 2017-10-02 DIAGNOSIS — H2511 Age-related nuclear cataract, right eye: Secondary | ICD-10-CM | POA: Diagnosis not present

## 2017-10-02 DIAGNOSIS — H25811 Combined forms of age-related cataract, right eye: Secondary | ICD-10-CM | POA: Diagnosis not present

## 2017-10-15 DIAGNOSIS — H25012 Cortical age-related cataract, left eye: Secondary | ICD-10-CM | POA: Diagnosis not present

## 2017-10-15 DIAGNOSIS — H2512 Age-related nuclear cataract, left eye: Secondary | ICD-10-CM | POA: Diagnosis not present

## 2017-10-16 DIAGNOSIS — R69 Illness, unspecified: Secondary | ICD-10-CM | POA: Diagnosis not present

## 2017-10-23 DIAGNOSIS — H2512 Age-related nuclear cataract, left eye: Secondary | ICD-10-CM | POA: Diagnosis not present

## 2017-10-23 DIAGNOSIS — H25812 Combined forms of age-related cataract, left eye: Secondary | ICD-10-CM | POA: Diagnosis not present

## 2017-11-01 DIAGNOSIS — K625 Hemorrhage of anus and rectum: Secondary | ICD-10-CM | POA: Diagnosis not present

## 2017-11-01 DIAGNOSIS — R198 Other specified symptoms and signs involving the digestive system and abdomen: Secondary | ICD-10-CM | POA: Diagnosis not present

## 2017-11-01 DIAGNOSIS — K649 Unspecified hemorrhoids: Secondary | ICD-10-CM | POA: Diagnosis not present

## 2017-11-04 IMAGING — MR MR HEAD WO/W CM
12 of 14 series · 40 of 48 positions shown · IV contrast (13ml multihance)
Comparison: None.

CLINICAL DATA: Recent onset of dizziness and gait disturbance.
Assess for demyelinating disease or mass.

EXAM:
MRI HEAD WITHOUT AND WITH CONTRAST
TECHNIQUE: Multiplanar, multiecho pulse sequences of the brain and surrounding
structures were obtained without and with intravenous contrast.
CONTRAST:  13mL MULTIHANCE GADOBENATE DIMEGLUMINE 529 MG/ML IV SOLN

[Series 2: T1 · sagittal · 5.0mm · 0.45mm/px · 1 of 22 slices shown]
[im 1/22]
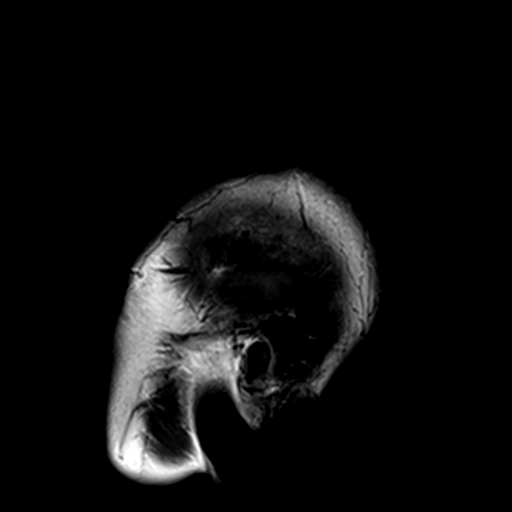

[Series 3: DWI · axial · 3.0mm · 1.80mm/px · z∈[-59,+88]mm · 5 of 99 slices shown (1 of 4)]
[im 1/99]
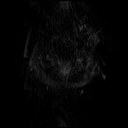
[im 25/99]
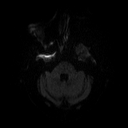
[im 50/99]
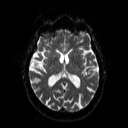
[im 74/99]
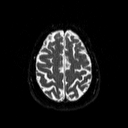
[im 99/99]
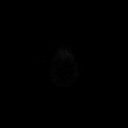

[Series 4: DWI · axial · 3.0mm · 1.80mm/px · z∈[-59,+88]mm · 3 of 50 slices shown (2 of 4)]
[im 1/50]
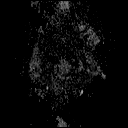
[im 25/50]
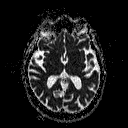
[im 50/50]
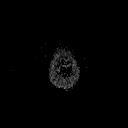

[Series 6: swi_images · axial · 2.0mm · 0.90mm/px · z∈[-65,+93]mm · 5 of 80 slices shown]
[im 1/80]
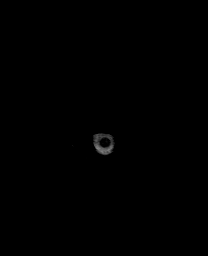
[im 20/80]
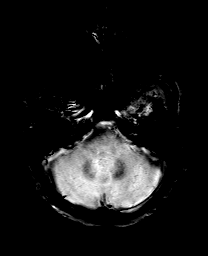
[im 40/80]
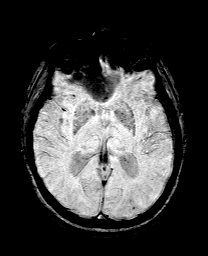
[im 60/80]
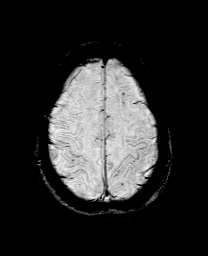
[im 80/80]
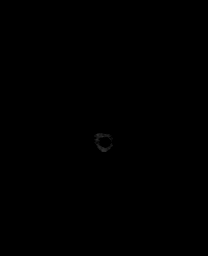

[Series 7: DWI · coronal · 5.0mm · 1.80mm/px · 4 of 65 slices shown (3 of 4)]
[im 1/65]
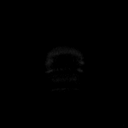
[im 22/65]
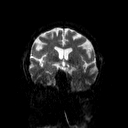
[im 43/65]
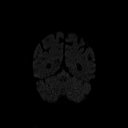
[im 65/65]
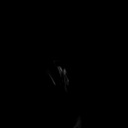

[Series 8: DWI · coronal · 5.0mm · 1.80mm/px · 2 of 36 slices shown (4 of 4)]
[im 1/36]
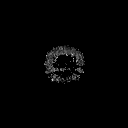
[im 36/36]
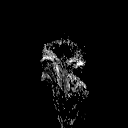

[Series 9: T2 · axial · 5.0mm · 0.51mm/px · 1 of 22 slices shown (1 of 2)]
[im 1/22]
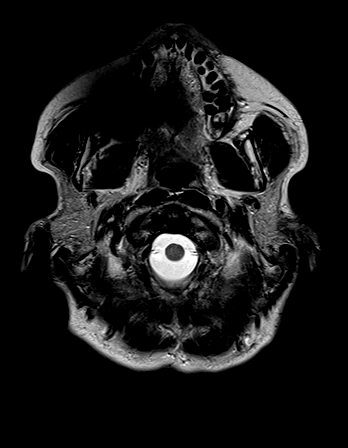

[Series 10: FLAIR · axial · 3.0mm · 0.45mm/px · z∈[-63,+93]mm · 2 of 27 slices shown (1 of 2)]
[im 1/27]
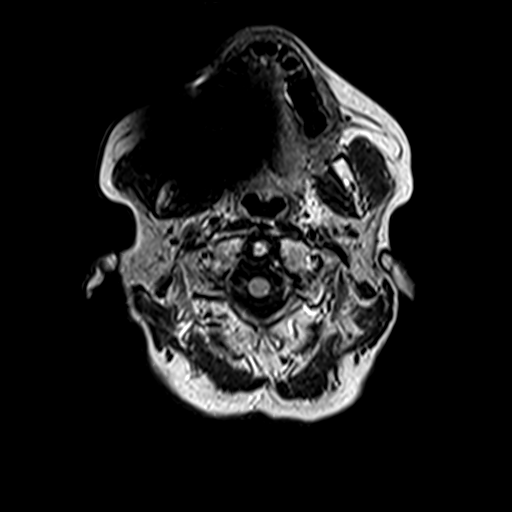
[im 27/27]
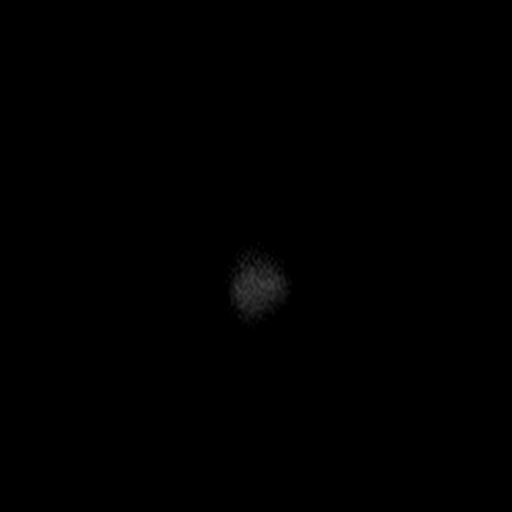

[Series 11: t1_mpr_tra · axial · 1.0mm · 0.45mm/px · z∈[-57,+86]mm · 8 of 144 slices shown (1 of 2)]
[im 1/144]
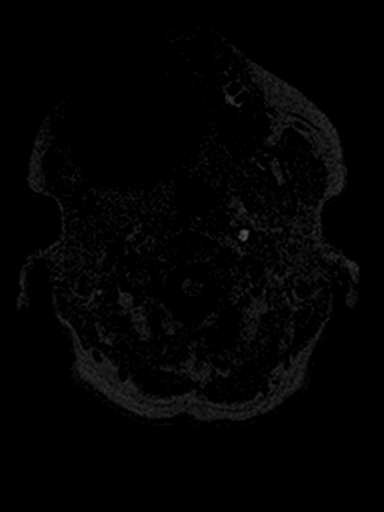
[im 18/144]
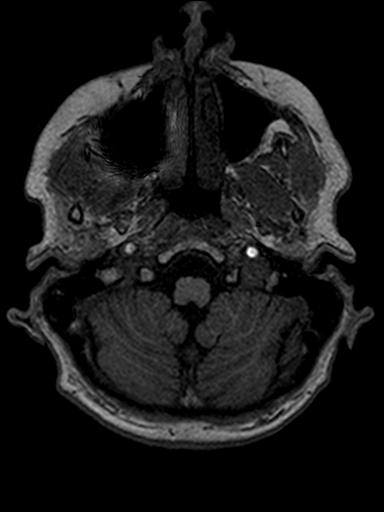
[im 36/144]
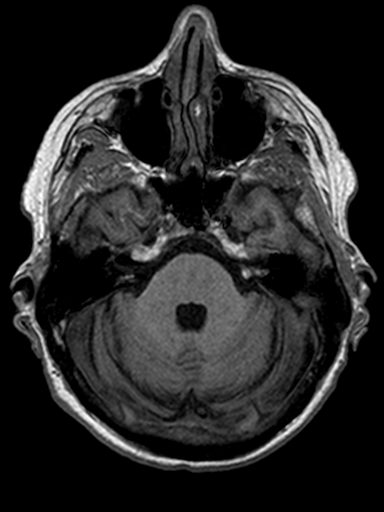
[im 54/144]
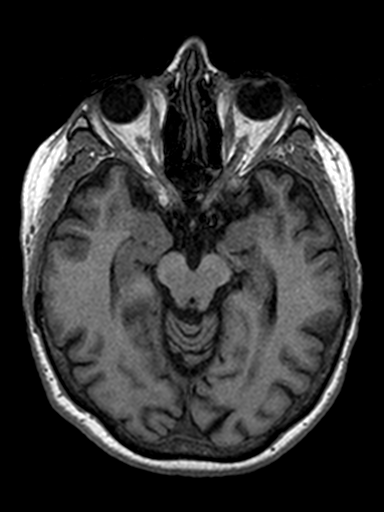
[im 90/144]
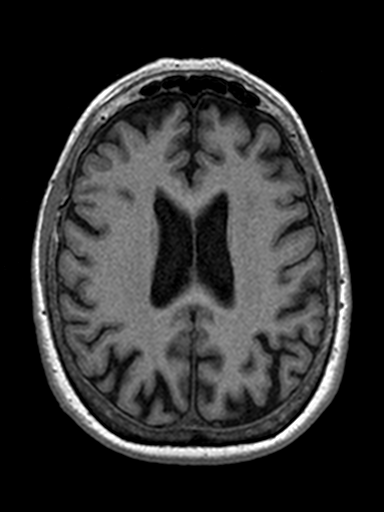
[im 108/144]
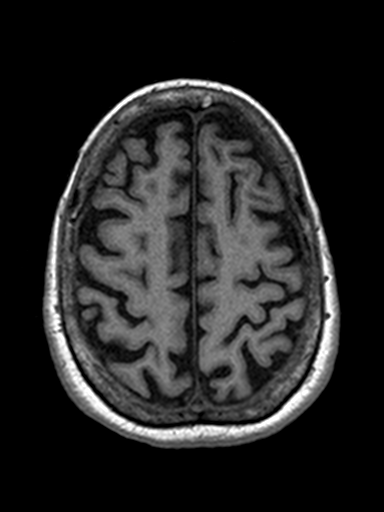
[im 126/144]
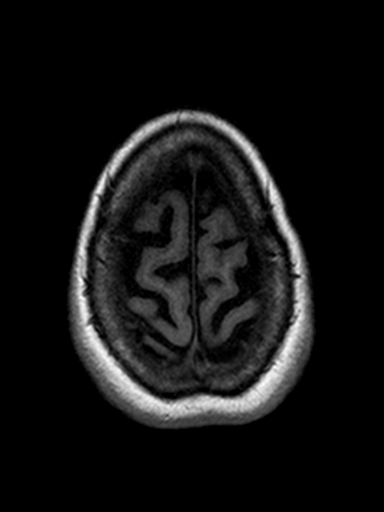
[im 144/144]
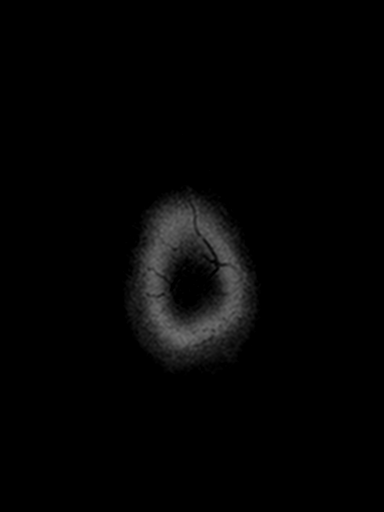

[Series 12: FLAIR · sagittal · 5.0mm · 0.45mm/px · 2 of 25 slices shown (2 of 2)]
[im 1/25]
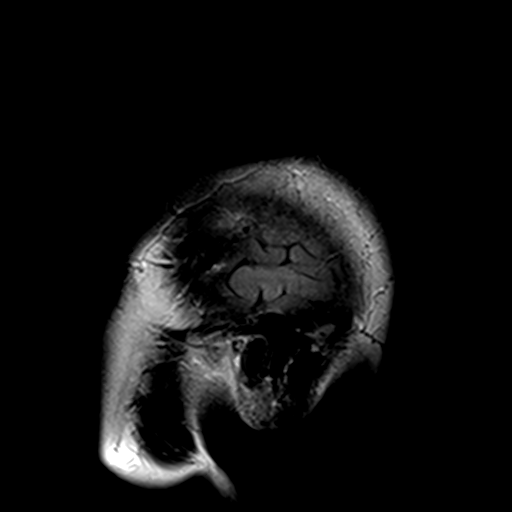
[im 25/25]
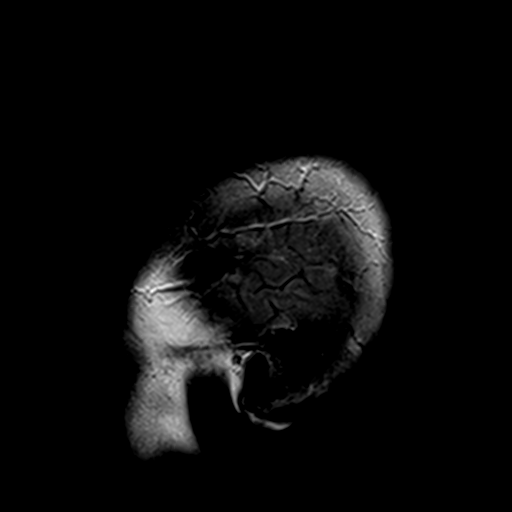

[Series 13: T2 · coronal · 5.0mm · 0.45mm/px · 2 of 26 slices shown (2 of 2)]
[im 1/26]
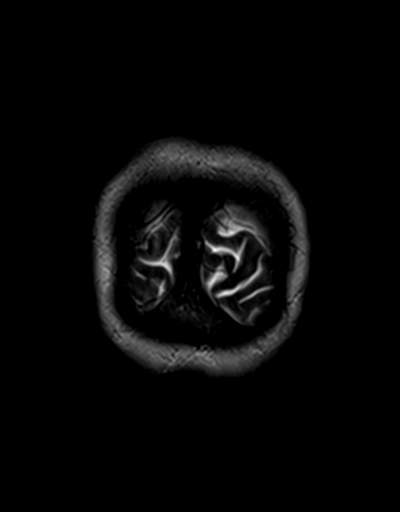
[im 26/26]
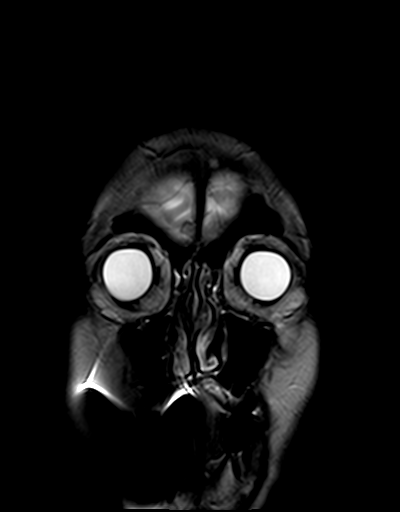

[Series 14: t1_mpr_tra · axial · 1.0mm · 0.45mm/px · z∈[-57,+32]mm · 5 of 144 slices shown (2 of 2)]
[im 1/144]
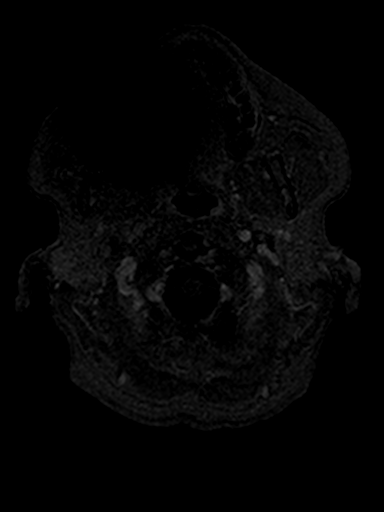
[im 18/144]
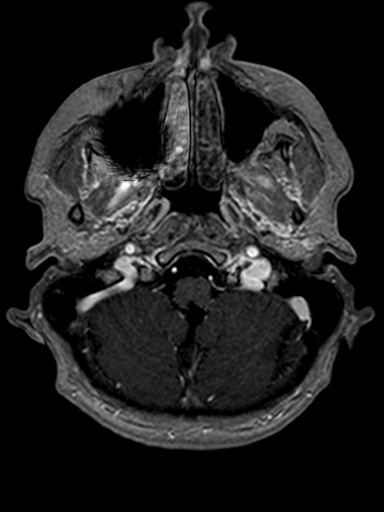
[im 36/144]
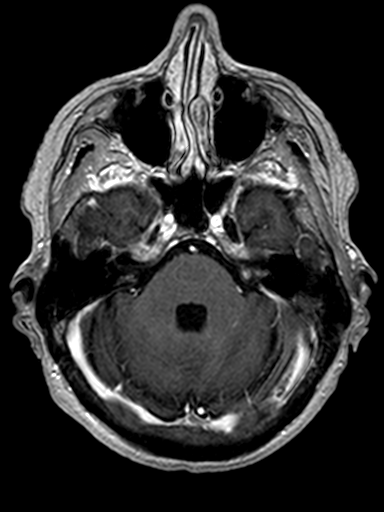
[im 54/144]
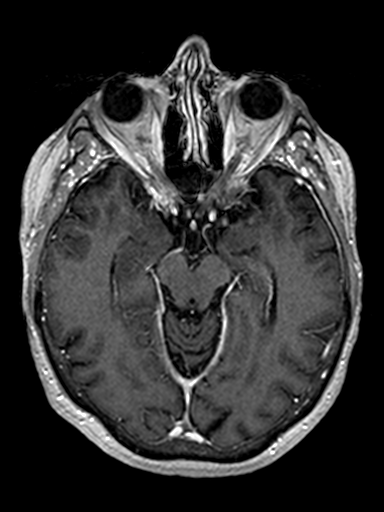
[im 90/144]
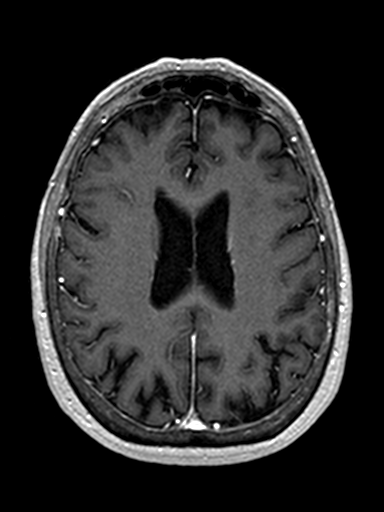

[40 of 48 positions shown; findings below may reference images not displayed]

FINDINGS: Brain: The brain has normal appearance without evidence of
malformation, atrophy, old or acute small or large vessel
infarction, hemorrhage, hydrocephalus or extra-axial collection. No
pituitary abnormality. After contrast administration, no abnormal
enhancement occurs.

Vascular: Major vessels at the base of the brain show flow.

Skull and upper cervical spine: Normal

Sinuses/Orbits: Clear/ normal.

Other: None significant.
IMPRESSION: Normal examination. No abnormality seen to explain the presenting
symptoms.

## 2017-11-29 DIAGNOSIS — Z0100 Encounter for examination of eyes and vision without abnormal findings: Secondary | ICD-10-CM | POA: Diagnosis not present

## 2017-12-25 DIAGNOSIS — R69 Illness, unspecified: Secondary | ICD-10-CM | POA: Diagnosis not present

## 2017-12-25 DIAGNOSIS — R5383 Other fatigue: Secondary | ICD-10-CM | POA: Diagnosis not present

## 2017-12-25 DIAGNOSIS — M542 Cervicalgia: Secondary | ICD-10-CM | POA: Diagnosis not present

## 2017-12-25 DIAGNOSIS — E785 Hyperlipidemia, unspecified: Secondary | ICD-10-CM | POA: Diagnosis not present

## 2017-12-25 DIAGNOSIS — N183 Chronic kidney disease, stage 3 (moderate): Secondary | ICD-10-CM | POA: Diagnosis not present

## 2018-02-05 DIAGNOSIS — N183 Chronic kidney disease, stage 3 (moderate): Secondary | ICD-10-CM | POA: Diagnosis not present

## 2018-02-05 DIAGNOSIS — M2550 Pain in unspecified joint: Secondary | ICD-10-CM | POA: Diagnosis not present

## 2018-02-05 DIAGNOSIS — R5382 Chronic fatigue, unspecified: Secondary | ICD-10-CM | POA: Diagnosis not present

## 2018-02-05 DIAGNOSIS — R3915 Urgency of urination: Secondary | ICD-10-CM | POA: Diagnosis not present

## 2018-02-15 DIAGNOSIS — K582 Mixed irritable bowel syndrome: Secondary | ICD-10-CM | POA: Diagnosis not present

## 2018-02-15 DIAGNOSIS — R5382 Chronic fatigue, unspecified: Secondary | ICD-10-CM | POA: Diagnosis not present

## 2018-02-19 DIAGNOSIS — R69 Illness, unspecified: Secondary | ICD-10-CM | POA: Diagnosis not present

## 2018-03-14 DIAGNOSIS — H40013 Open angle with borderline findings, low risk, bilateral: Secondary | ICD-10-CM | POA: Diagnosis not present

## 2018-03-14 DIAGNOSIS — H35361 Drusen (degenerative) of macula, right eye: Secondary | ICD-10-CM | POA: Diagnosis not present

## 2018-03-14 DIAGNOSIS — H43822 Vitreomacular adhesion, left eye: Secondary | ICD-10-CM | POA: Diagnosis not present

## 2018-03-14 DIAGNOSIS — H35033 Hypertensive retinopathy, bilateral: Secondary | ICD-10-CM | POA: Diagnosis not present

## 2018-04-05 DIAGNOSIS — R5382 Chronic fatigue, unspecified: Secondary | ICD-10-CM | POA: Diagnosis not present

## 2018-04-05 DIAGNOSIS — K582 Mixed irritable bowel syndrome: Secondary | ICD-10-CM | POA: Diagnosis not present

## 2018-04-05 DIAGNOSIS — N302 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: Secondary | ICD-10-CM | POA: Diagnosis not present

## 2018-04-25 DIAGNOSIS — Z23 Encounter for immunization: Secondary | ICD-10-CM | POA: Diagnosis not present

## 2018-06-03 DIAGNOSIS — R69 Illness, unspecified: Secondary | ICD-10-CM | POA: Diagnosis not present

## 2018-06-17 DIAGNOSIS — R5382 Chronic fatigue, unspecified: Secondary | ICD-10-CM | POA: Diagnosis not present

## 2018-06-17 DIAGNOSIS — R05 Cough: Secondary | ICD-10-CM | POA: Diagnosis not present

## 2018-06-17 DIAGNOSIS — N183 Chronic kidney disease, stage 3 (moderate): Secondary | ICD-10-CM | POA: Diagnosis not present

## 2018-06-17 DIAGNOSIS — M810 Age-related osteoporosis without current pathological fracture: Secondary | ICD-10-CM | POA: Diagnosis not present

## 2018-06-17 DIAGNOSIS — R69 Illness, unspecified: Secondary | ICD-10-CM | POA: Diagnosis not present

## 2018-06-17 DIAGNOSIS — Z0000 Encounter for general adult medical examination without abnormal findings: Secondary | ICD-10-CM | POA: Diagnosis not present

## 2018-06-17 DIAGNOSIS — E785 Hyperlipidemia, unspecified: Secondary | ICD-10-CM | POA: Diagnosis not present

## 2018-06-24 ENCOUNTER — Other Ambulatory Visit: Payer: Self-pay | Admitting: Family Medicine

## 2018-06-24 DIAGNOSIS — M810 Age-related osteoporosis without current pathological fracture: Secondary | ICD-10-CM

## 2018-06-28 DIAGNOSIS — M542 Cervicalgia: Secondary | ICD-10-CM | POA: Diagnosis not present

## 2018-07-01 DIAGNOSIS — R5383 Other fatigue: Secondary | ICD-10-CM | POA: Diagnosis not present

## 2018-07-01 DIAGNOSIS — M542 Cervicalgia: Secondary | ICD-10-CM | POA: Diagnosis not present

## 2018-07-01 DIAGNOSIS — R2681 Unsteadiness on feet: Secondary | ICD-10-CM | POA: Diagnosis not present

## 2018-07-08 DIAGNOSIS — R5383 Other fatigue: Secondary | ICD-10-CM | POA: Diagnosis not present

## 2018-07-08 DIAGNOSIS — R2681 Unsteadiness on feet: Secondary | ICD-10-CM | POA: Diagnosis not present

## 2018-07-08 DIAGNOSIS — M542 Cervicalgia: Secondary | ICD-10-CM | POA: Diagnosis not present

## 2018-07-11 DIAGNOSIS — M542 Cervicalgia: Secondary | ICD-10-CM | POA: Diagnosis not present

## 2018-07-11 DIAGNOSIS — R5383 Other fatigue: Secondary | ICD-10-CM | POA: Diagnosis not present

## 2018-07-11 DIAGNOSIS — R2681 Unsteadiness on feet: Secondary | ICD-10-CM | POA: Diagnosis not present

## 2018-07-15 DIAGNOSIS — R5382 Chronic fatigue, unspecified: Secondary | ICD-10-CM | POA: Diagnosis not present

## 2018-07-15 DIAGNOSIS — M47812 Spondylosis without myelopathy or radiculopathy, cervical region: Secondary | ICD-10-CM | POA: Diagnosis not present

## 2018-07-17 DIAGNOSIS — R69 Illness, unspecified: Secondary | ICD-10-CM | POA: Diagnosis not present

## 2018-07-23 ENCOUNTER — Ambulatory Visit: Payer: Medicare HMO | Admitting: Internal Medicine

## 2018-08-28 DIAGNOSIS — G4700 Insomnia, unspecified: Secondary | ICD-10-CM | POA: Diagnosis not present

## 2018-09-09 ENCOUNTER — Ambulatory Visit
Admission: RE | Admit: 2018-09-09 | Discharge: 2018-09-09 | Disposition: A | Discharge status: Home / Self Care | Payer: Medicare HMO | Source: Ambulatory Visit | Attending: Family Medicine | Admitting: Family Medicine

## 2018-09-09 DIAGNOSIS — M810 Age-related osteoporosis without current pathological fracture: Secondary | ICD-10-CM

## 2018-09-09 DIAGNOSIS — Z780 Asymptomatic menopausal state: Secondary | ICD-10-CM | POA: Diagnosis not present

## 2018-12-24 DIAGNOSIS — N390 Urinary tract infection, site not specified: Secondary | ICD-10-CM | POA: Diagnosis not present

## 2019-01-17 DIAGNOSIS — N390 Urinary tract infection, site not specified: Secondary | ICD-10-CM | POA: Diagnosis not present

## 2019-03-05 DIAGNOSIS — R69 Illness, unspecified: Secondary | ICD-10-CM | POA: Diagnosis not present

## 2019-03-05 DIAGNOSIS — M810 Age-related osteoporosis without current pathological fracture: Secondary | ICD-10-CM | POA: Diagnosis not present

## 2019-03-05 DIAGNOSIS — N183 Chronic kidney disease, stage 3 (moderate): Secondary | ICD-10-CM | POA: Diagnosis not present

## 2019-03-05 DIAGNOSIS — R5382 Chronic fatigue, unspecified: Secondary | ICD-10-CM | POA: Diagnosis not present

## 2019-03-18 DIAGNOSIS — H35373 Puckering of macula, bilateral: Secondary | ICD-10-CM | POA: Diagnosis not present

## 2019-03-18 DIAGNOSIS — H40013 Open angle with borderline findings, low risk, bilateral: Secondary | ICD-10-CM | POA: Diagnosis not present

## 2019-03-18 DIAGNOSIS — H43822 Vitreomacular adhesion, left eye: Secondary | ICD-10-CM | POA: Diagnosis not present

## 2019-03-18 DIAGNOSIS — H35033 Hypertensive retinopathy, bilateral: Secondary | ICD-10-CM | POA: Diagnosis not present

## 2019-04-02 DIAGNOSIS — R69 Illness, unspecified: Secondary | ICD-10-CM | POA: Diagnosis not present

## 2019-04-08 DIAGNOSIS — L821 Other seborrheic keratosis: Secondary | ICD-10-CM | POA: Diagnosis not present

## 2019-04-08 DIAGNOSIS — D1801 Hemangioma of skin and subcutaneous tissue: Secondary | ICD-10-CM | POA: Diagnosis not present

## 2019-04-08 DIAGNOSIS — D225 Melanocytic nevi of trunk: Secondary | ICD-10-CM | POA: Diagnosis not present

## 2019-04-09 DIAGNOSIS — R69 Illness, unspecified: Secondary | ICD-10-CM | POA: Diagnosis not present

## 2019-04-16 DIAGNOSIS — R69 Illness, unspecified: Secondary | ICD-10-CM | POA: Diagnosis not present

## 2019-04-17 DIAGNOSIS — R69 Illness, unspecified: Secondary | ICD-10-CM | POA: Diagnosis not present

## 2019-04-18 DIAGNOSIS — R69 Illness, unspecified: Secondary | ICD-10-CM | POA: Diagnosis not present

## 2019-04-21 DIAGNOSIS — R69 Illness, unspecified: Secondary | ICD-10-CM | POA: Diagnosis not present

## 2019-04-22 DIAGNOSIS — R69 Illness, unspecified: Secondary | ICD-10-CM | POA: Diagnosis not present

## 2019-04-23 DIAGNOSIS — R69 Illness, unspecified: Secondary | ICD-10-CM | POA: Diagnosis not present

## 2019-04-24 DIAGNOSIS — R69 Illness, unspecified: Secondary | ICD-10-CM | POA: Diagnosis not present

## 2019-04-25 DIAGNOSIS — R69 Illness, unspecified: Secondary | ICD-10-CM | POA: Diagnosis not present

## 2019-04-28 DIAGNOSIS — R69 Illness, unspecified: Secondary | ICD-10-CM | POA: Diagnosis not present

## 2019-04-29 DIAGNOSIS — R69 Illness, unspecified: Secondary | ICD-10-CM | POA: Diagnosis not present

## 2019-04-30 DIAGNOSIS — R69 Illness, unspecified: Secondary | ICD-10-CM | POA: Diagnosis not present

## 2019-05-02 DIAGNOSIS — R69 Illness, unspecified: Secondary | ICD-10-CM | POA: Diagnosis not present

## 2019-05-05 DIAGNOSIS — R69 Illness, unspecified: Secondary | ICD-10-CM | POA: Diagnosis not present

## 2019-05-06 DIAGNOSIS — R69 Illness, unspecified: Secondary | ICD-10-CM | POA: Diagnosis not present

## 2019-05-07 DIAGNOSIS — R69 Illness, unspecified: Secondary | ICD-10-CM | POA: Diagnosis not present

## 2019-05-08 DIAGNOSIS — R69 Illness, unspecified: Secondary | ICD-10-CM | POA: Diagnosis not present

## 2019-05-09 DIAGNOSIS — R69 Illness, unspecified: Secondary | ICD-10-CM | POA: Diagnosis not present

## 2019-05-12 DIAGNOSIS — R69 Illness, unspecified: Secondary | ICD-10-CM | POA: Diagnosis not present

## 2019-05-13 DIAGNOSIS — R69 Illness, unspecified: Secondary | ICD-10-CM | POA: Diagnosis not present

## 2019-05-14 DIAGNOSIS — R69 Illness, unspecified: Secondary | ICD-10-CM | POA: Diagnosis not present

## 2019-05-15 DIAGNOSIS — R69 Illness, unspecified: Secondary | ICD-10-CM | POA: Diagnosis not present

## 2019-05-19 DIAGNOSIS — R69 Illness, unspecified: Secondary | ICD-10-CM | POA: Diagnosis not present

## 2019-05-20 DIAGNOSIS — R69 Illness, unspecified: Secondary | ICD-10-CM | POA: Diagnosis not present

## 2019-05-21 DIAGNOSIS — R69 Illness, unspecified: Secondary | ICD-10-CM | POA: Diagnosis not present

## 2019-05-22 DIAGNOSIS — R69 Illness, unspecified: Secondary | ICD-10-CM | POA: Diagnosis not present

## 2019-05-23 DIAGNOSIS — R69 Illness, unspecified: Secondary | ICD-10-CM | POA: Diagnosis not present

## 2019-05-26 DIAGNOSIS — R69 Illness, unspecified: Secondary | ICD-10-CM | POA: Diagnosis not present

## 2019-05-27 DIAGNOSIS — R69 Illness, unspecified: Secondary | ICD-10-CM | POA: Diagnosis not present

## 2019-05-28 DIAGNOSIS — R69 Illness, unspecified: Secondary | ICD-10-CM | POA: Diagnosis not present

## 2019-05-30 DIAGNOSIS — R69 Illness, unspecified: Secondary | ICD-10-CM | POA: Diagnosis not present

## 2019-06-02 DIAGNOSIS — R69 Illness, unspecified: Secondary | ICD-10-CM | POA: Diagnosis not present

## 2019-06-05 DIAGNOSIS — R69 Illness, unspecified: Secondary | ICD-10-CM | POA: Diagnosis not present

## 2019-06-09 DIAGNOSIS — R69 Illness, unspecified: Secondary | ICD-10-CM | POA: Diagnosis not present

## 2019-06-12 DIAGNOSIS — R69 Illness, unspecified: Secondary | ICD-10-CM | POA: Diagnosis not present

## 2019-06-16 DIAGNOSIS — R69 Illness, unspecified: Secondary | ICD-10-CM | POA: Diagnosis not present

## 2019-06-19 DIAGNOSIS — R69 Illness, unspecified: Secondary | ICD-10-CM | POA: Diagnosis not present

## 2019-06-23 DIAGNOSIS — R300 Dysuria: Secondary | ICD-10-CM | POA: Diagnosis not present

## 2019-07-23 DIAGNOSIS — R69 Illness, unspecified: Secondary | ICD-10-CM | POA: Diagnosis not present

## 2019-08-14 DIAGNOSIS — M47812 Spondylosis without myelopathy or radiculopathy, cervical region: Secondary | ICD-10-CM | POA: Diagnosis not present

## 2019-08-14 DIAGNOSIS — R8290 Unspecified abnormal findings in urine: Secondary | ICD-10-CM | POA: Diagnosis not present

## 2019-08-14 DIAGNOSIS — R5382 Chronic fatigue, unspecified: Secondary | ICD-10-CM | POA: Diagnosis not present

## 2019-09-10 DIAGNOSIS — G4700 Insomnia, unspecified: Secondary | ICD-10-CM | POA: Diagnosis not present

## 2019-09-30 DIAGNOSIS — N390 Urinary tract infection, site not specified: Secondary | ICD-10-CM | POA: Diagnosis not present

## 2019-10-06 DIAGNOSIS — Z0000 Encounter for general adult medical examination without abnormal findings: Secondary | ICD-10-CM | POA: Diagnosis not present

## 2019-10-06 DIAGNOSIS — E785 Hyperlipidemia, unspecified: Secondary | ICD-10-CM | POA: Diagnosis not present

## 2019-10-06 DIAGNOSIS — R5382 Chronic fatigue, unspecified: Secondary | ICD-10-CM | POA: Diagnosis not present

## 2019-10-06 DIAGNOSIS — R69 Illness, unspecified: Secondary | ICD-10-CM | POA: Diagnosis not present

## 2019-10-06 DIAGNOSIS — M810 Age-related osteoporosis without current pathological fracture: Secondary | ICD-10-CM | POA: Diagnosis not present

## 2019-10-06 DIAGNOSIS — N183 Chronic kidney disease, stage 3 unspecified: Secondary | ICD-10-CM | POA: Diagnosis not present

## 2019-10-09 ENCOUNTER — Other Ambulatory Visit: Payer: Self-pay | Admitting: Family Medicine

## 2019-10-09 DIAGNOSIS — Z1231 Encounter for screening mammogram for malignant neoplasm of breast: Secondary | ICD-10-CM

## 2019-10-14 ENCOUNTER — Ambulatory Visit
Admission: RE | Admit: 2019-10-14 | Discharge: 2019-10-14 | Disposition: A | Discharge status: Home / Self Care | Payer: Medicare HMO | Source: Ambulatory Visit | Attending: Family Medicine | Admitting: Family Medicine

## 2019-10-14 ENCOUNTER — Other Ambulatory Visit: Payer: Self-pay

## 2019-10-14 DIAGNOSIS — Z1231 Encounter for screening mammogram for malignant neoplasm of breast: Secondary | ICD-10-CM | POA: Diagnosis not present

## 2019-11-18 DIAGNOSIS — R69 Illness, unspecified: Secondary | ICD-10-CM | POA: Diagnosis not present

## 2020-01-05 DIAGNOSIS — M47812 Spondylosis without myelopathy or radiculopathy, cervical region: Secondary | ICD-10-CM | POA: Diagnosis not present

## 2020-01-05 DIAGNOSIS — M549 Dorsalgia, unspecified: Secondary | ICD-10-CM | POA: Diagnosis not present

## 2020-01-16 DIAGNOSIS — M549 Dorsalgia, unspecified: Secondary | ICD-10-CM | POA: Diagnosis not present

## 2020-01-16 DIAGNOSIS — M47812 Spondylosis without myelopathy or radiculopathy, cervical region: Secondary | ICD-10-CM | POA: Diagnosis not present

## 2020-02-24 DIAGNOSIS — M797 Fibromyalgia: Secondary | ICD-10-CM | POA: Diagnosis not present

## 2020-02-24 DIAGNOSIS — R5382 Chronic fatigue, unspecified: Secondary | ICD-10-CM | POA: Diagnosis not present

## 2020-02-24 DIAGNOSIS — R69 Illness, unspecified: Secondary | ICD-10-CM | POA: Diagnosis not present

## 2020-02-24 DIAGNOSIS — M1990 Unspecified osteoarthritis, unspecified site: Secondary | ICD-10-CM | POA: Diagnosis not present

## 2020-04-12 DIAGNOSIS — Z23 Encounter for immunization: Secondary | ICD-10-CM | POA: Diagnosis not present

## 2020-05-25 DIAGNOSIS — R69 Illness, unspecified: Secondary | ICD-10-CM | POA: Diagnosis not present

## 2020-10-12 DIAGNOSIS — R002 Palpitations: Secondary | ICD-10-CM | POA: Diagnosis not present

## 2020-10-12 DIAGNOSIS — M810 Age-related osteoporosis without current pathological fracture: Secondary | ICD-10-CM | POA: Diagnosis not present

## 2020-10-12 DIAGNOSIS — G4700 Insomnia, unspecified: Secondary | ICD-10-CM | POA: Diagnosis not present

## 2020-10-12 DIAGNOSIS — M47812 Spondylosis without myelopathy or radiculopathy, cervical region: Secondary | ICD-10-CM | POA: Diagnosis not present

## 2020-10-12 DIAGNOSIS — E785 Hyperlipidemia, unspecified: Secondary | ICD-10-CM | POA: Diagnosis not present

## 2020-10-12 DIAGNOSIS — M5489 Other dorsalgia: Secondary | ICD-10-CM | POA: Diagnosis not present

## 2020-10-12 DIAGNOSIS — R69 Illness, unspecified: Secondary | ICD-10-CM | POA: Diagnosis not present

## 2020-10-12 DIAGNOSIS — Z0000 Encounter for general adult medical examination without abnormal findings: Secondary | ICD-10-CM | POA: Diagnosis not present

## 2020-10-12 DIAGNOSIS — N183 Chronic kidney disease, stage 3 unspecified: Secondary | ICD-10-CM | POA: Diagnosis not present

## 2020-10-12 DIAGNOSIS — R5382 Chronic fatigue, unspecified: Secondary | ICD-10-CM | POA: Diagnosis not present

## 2020-10-13 ENCOUNTER — Ambulatory Visit
Admission: RE | Admit: 2020-10-13 | Discharge: 2020-10-13 | Disposition: A | Discharge status: Home / Self Care | Payer: Medicare HMO | Source: Ambulatory Visit | Attending: Family Medicine | Admitting: Family Medicine

## 2020-10-13 ENCOUNTER — Other Ambulatory Visit: Payer: Self-pay | Admitting: Family Medicine

## 2020-10-13 DIAGNOSIS — M898X8 Other specified disorders of bone, other site: Secondary | ICD-10-CM | POA: Diagnosis not present

## 2020-10-13 DIAGNOSIS — M5489 Other dorsalgia: Secondary | ICD-10-CM

## 2020-10-13 DIAGNOSIS — M549 Dorsalgia, unspecified: Secondary | ICD-10-CM | POA: Diagnosis not present

## 2020-10-13 DIAGNOSIS — N183 Chronic kidney disease, stage 3 unspecified: Secondary | ICD-10-CM | POA: Diagnosis not present

## 2020-10-13 DIAGNOSIS — E785 Hyperlipidemia, unspecified: Secondary | ICD-10-CM | POA: Diagnosis not present

## 2020-10-13 DIAGNOSIS — M542 Cervicalgia: Secondary | ICD-10-CM | POA: Diagnosis not present

## 2020-10-13 DIAGNOSIS — M47812 Spondylosis without myelopathy or radiculopathy, cervical region: Secondary | ICD-10-CM

## 2020-10-13 DIAGNOSIS — M545 Low back pain, unspecified: Secondary | ICD-10-CM | POA: Diagnosis not present

## 2020-12-07 DIAGNOSIS — M5136 Other intervertebral disc degeneration, lumbar region: Secondary | ICD-10-CM | POA: Diagnosis not present

## 2020-12-07 DIAGNOSIS — M5031 Other cervical disc degeneration,  high cervical region: Secondary | ICD-10-CM | POA: Diagnosis not present

## 2020-12-07 DIAGNOSIS — M9901 Segmental and somatic dysfunction of cervical region: Secondary | ICD-10-CM | POA: Diagnosis not present

## 2020-12-07 DIAGNOSIS — M9903 Segmental and somatic dysfunction of lumbar region: Secondary | ICD-10-CM | POA: Diagnosis not present

## 2020-12-09 ENCOUNTER — Ambulatory Visit: Payer: Medicare HMO | Admitting: Cardiovascular Disease

## 2020-12-13 DIAGNOSIS — M5136 Other intervertebral disc degeneration, lumbar region: Secondary | ICD-10-CM | POA: Diagnosis not present

## 2020-12-13 DIAGNOSIS — M9903 Segmental and somatic dysfunction of lumbar region: Secondary | ICD-10-CM | POA: Diagnosis not present

## 2020-12-13 DIAGNOSIS — M5031 Other cervical disc degeneration,  high cervical region: Secondary | ICD-10-CM | POA: Diagnosis not present

## 2020-12-13 DIAGNOSIS — M9901 Segmental and somatic dysfunction of cervical region: Secondary | ICD-10-CM | POA: Diagnosis not present

## 2020-12-20 DIAGNOSIS — M9903 Segmental and somatic dysfunction of lumbar region: Secondary | ICD-10-CM | POA: Diagnosis not present

## 2020-12-20 DIAGNOSIS — M5136 Other intervertebral disc degeneration, lumbar region: Secondary | ICD-10-CM | POA: Diagnosis not present

## 2020-12-20 DIAGNOSIS — M9901 Segmental and somatic dysfunction of cervical region: Secondary | ICD-10-CM | POA: Diagnosis not present

## 2020-12-20 DIAGNOSIS — M5031 Other cervical disc degeneration,  high cervical region: Secondary | ICD-10-CM | POA: Diagnosis not present

## 2020-12-30 DIAGNOSIS — M9903 Segmental and somatic dysfunction of lumbar region: Secondary | ICD-10-CM | POA: Diagnosis not present

## 2020-12-30 DIAGNOSIS — M5031 Other cervical disc degeneration,  high cervical region: Secondary | ICD-10-CM | POA: Diagnosis not present

## 2020-12-30 DIAGNOSIS — M5136 Other intervertebral disc degeneration, lumbar region: Secondary | ICD-10-CM | POA: Diagnosis not present

## 2020-12-30 DIAGNOSIS — M9901 Segmental and somatic dysfunction of cervical region: Secondary | ICD-10-CM | POA: Diagnosis not present

## 2021-01-04 DIAGNOSIS — M9903 Segmental and somatic dysfunction of lumbar region: Secondary | ICD-10-CM | POA: Diagnosis not present

## 2021-01-04 DIAGNOSIS — M9901 Segmental and somatic dysfunction of cervical region: Secondary | ICD-10-CM | POA: Diagnosis not present

## 2021-01-04 DIAGNOSIS — M5031 Other cervical disc degeneration,  high cervical region: Secondary | ICD-10-CM | POA: Diagnosis not present

## 2021-01-04 DIAGNOSIS — M5136 Other intervertebral disc degeneration, lumbar region: Secondary | ICD-10-CM | POA: Diagnosis not present

## 2021-01-06 DIAGNOSIS — M9903 Segmental and somatic dysfunction of lumbar region: Secondary | ICD-10-CM | POA: Diagnosis not present

## 2021-01-06 DIAGNOSIS — M5136 Other intervertebral disc degeneration, lumbar region: Secondary | ICD-10-CM | POA: Diagnosis not present

## 2021-01-06 DIAGNOSIS — M5031 Other cervical disc degeneration,  high cervical region: Secondary | ICD-10-CM | POA: Diagnosis not present

## 2021-01-06 DIAGNOSIS — M9901 Segmental and somatic dysfunction of cervical region: Secondary | ICD-10-CM | POA: Diagnosis not present

## 2021-01-17 DIAGNOSIS — M9903 Segmental and somatic dysfunction of lumbar region: Secondary | ICD-10-CM | POA: Diagnosis not present

## 2021-01-17 DIAGNOSIS — M9901 Segmental and somatic dysfunction of cervical region: Secondary | ICD-10-CM | POA: Diagnosis not present

## 2021-01-17 DIAGNOSIS — M5031 Other cervical disc degeneration,  high cervical region: Secondary | ICD-10-CM | POA: Diagnosis not present

## 2021-01-17 DIAGNOSIS — M5136 Other intervertebral disc degeneration, lumbar region: Secondary | ICD-10-CM | POA: Diagnosis not present

## 2021-01-19 DIAGNOSIS — M5031 Other cervical disc degeneration,  high cervical region: Secondary | ICD-10-CM | POA: Diagnosis not present

## 2021-01-19 DIAGNOSIS — M9903 Segmental and somatic dysfunction of lumbar region: Secondary | ICD-10-CM | POA: Diagnosis not present

## 2021-01-19 DIAGNOSIS — M5136 Other intervertebral disc degeneration, lumbar region: Secondary | ICD-10-CM | POA: Diagnosis not present

## 2021-01-19 DIAGNOSIS — M9901 Segmental and somatic dysfunction of cervical region: Secondary | ICD-10-CM | POA: Diagnosis not present

## 2021-01-24 DIAGNOSIS — M9901 Segmental and somatic dysfunction of cervical region: Secondary | ICD-10-CM | POA: Diagnosis not present

## 2021-01-24 DIAGNOSIS — M5136 Other intervertebral disc degeneration, lumbar region: Secondary | ICD-10-CM | POA: Diagnosis not present

## 2021-01-24 DIAGNOSIS — M5031 Other cervical disc degeneration,  high cervical region: Secondary | ICD-10-CM | POA: Diagnosis not present

## 2021-01-24 DIAGNOSIS — M9903 Segmental and somatic dysfunction of lumbar region: Secondary | ICD-10-CM | POA: Diagnosis not present

## 2021-01-27 DIAGNOSIS — M9903 Segmental and somatic dysfunction of lumbar region: Secondary | ICD-10-CM | POA: Diagnosis not present

## 2021-01-27 DIAGNOSIS — M5031 Other cervical disc degeneration,  high cervical region: Secondary | ICD-10-CM | POA: Diagnosis not present

## 2021-01-27 DIAGNOSIS — M5136 Other intervertebral disc degeneration, lumbar region: Secondary | ICD-10-CM | POA: Diagnosis not present

## 2021-01-27 DIAGNOSIS — M9901 Segmental and somatic dysfunction of cervical region: Secondary | ICD-10-CM | POA: Diagnosis not present

## 2021-01-31 ENCOUNTER — Ambulatory Visit: Payer: Medicare HMO | Admitting: Cardiovascular Disease

## 2021-01-31 DIAGNOSIS — M5136 Other intervertebral disc degeneration, lumbar region: Secondary | ICD-10-CM | POA: Diagnosis not present

## 2021-01-31 DIAGNOSIS — M9901 Segmental and somatic dysfunction of cervical region: Secondary | ICD-10-CM | POA: Diagnosis not present

## 2021-01-31 DIAGNOSIS — M9903 Segmental and somatic dysfunction of lumbar region: Secondary | ICD-10-CM | POA: Diagnosis not present

## 2021-01-31 DIAGNOSIS — M5031 Other cervical disc degeneration,  high cervical region: Secondary | ICD-10-CM | POA: Diagnosis not present

## 2021-02-02 ENCOUNTER — Other Ambulatory Visit: Payer: Self-pay

## 2021-02-02 ENCOUNTER — Ambulatory Visit: Payer: Medicare HMO | Admitting: Cardiovascular Disease

## 2021-02-02 ENCOUNTER — Encounter: Payer: Self-pay | Admitting: Cardiovascular Disease

## 2021-02-02 VITALS — BP 112/64 | HR 77 | Ht 64.5 in | Wt 151.0 lb

## 2021-02-02 DIAGNOSIS — R9431 Abnormal electrocardiogram [ECG] [EKG]: Secondary | ICD-10-CM

## 2021-02-02 DIAGNOSIS — I493 Ventricular premature depolarization: Secondary | ICD-10-CM | POA: Diagnosis not present

## 2021-02-02 NOTE — Progress Notes (Signed)
Cardiology Office Note:    Date:  02/02/2021   ID:  Dawn Harmon, DOB 02-Aug-1940, MRN [id number]  PCP:  Harlan Stains, MD   [facility] Providers Cardiologist:  None     Referring MD: Harlan Stains, MD   Chief Complaint  Patient presents with   New Patient (Initial Visit)   Chest Pain    Pressure.  Dawn Harmon is a 80 y.o. female who is being seen today for the evaluation of chest discomfort and palpitations at the request of Harlan Stains, MD.   History of Present Illness:    Dawn Harmon is a 80 y.o. female with a hx of chronic fatigue syndrome that has plagued her over 30 years, worse in the last 10 years, occasional palpitations, presenting after experiencing a single episode of mild chest discomfort several months ago.  The episode is so remote that she cannot really remember the details of her chest discomfort, except that it occurred at rest, was not intense and was fairly transient, lasting under 15 minutes.  Physical limitations are related to extreme fatigue.  She feels that she has "the flu all the time".  She has brain fog, nausea and easily becomes exhausted.  She is clear in the description of her symptoms as representing fatigue rather than dyspnea.  She is okay climbing a flight of stairs from their basement garage to the main living quarters as long as she does not have to carry groceries or other weights.  If she has to carry bags, she has to stop halfway to rest.  She denies dizziness or syncope and she has not experienced any falls.  Her palpitations are present mostly in the evenings and are isolated skipped beats rather than sustained arrhythmia.  She denies other focal neurological complaints.  She does not have lower extremity edema, orthopnea or PND.  Evaluation for arrhythmia performed in 2015 showed frequent premature ventricular contractions, ventricular couplets and occasional brief runs of nonsustained VT (maximum 5 beats).  At that same time she  underwent an echocardiogram and nuclear stress test which both showed benign findings.  LVEF was 50-55% on the echo, without evidence of significant LVH or valvular abnormalities.  There is mention of diastolic dysfunction but diastolic parameters are probably normal for her age.  ECG treadmill stress testing did not show any ST segment changes, but did show frequent PVCs and occasional ventricular couplets.   Past Medical History:  Diagnosis Date   Chronic fatigue fibromyalgia syndrome    CKD (chronic kidney disease)    Depression    Insomnia    Osteoarthritis    neck   Osteoporosis     Past Surgical History:  Procedure Laterality Date   BUNIONECTOMY     HERNIA REPAIR     TONSILLECTOMY      Current Medications: Current Meds  Medication Sig   B Complex Vitamins (B COMPLEX PO) Take by mouth.   Cholecalciferol (VITAMIN D PO) Take by mouth daily.   Cranberry 300 MG tablet Take 300 mg by mouth daily.   Multiple Vitamin (MULTIVITAMIN) tablet Take 1 tablet by mouth daily.   TURMERIC PO Take by mouth daily.   [DISCONTINUED] Coenzyme Q10 (CO Q 10 PO) Take by mouth daily.   [DISCONTINUED] VITAMIN K PO Take by mouth daily.     Allergies:   Levofloxacin   Social History   Socioeconomic History   Marital status: Married    Spouse name: Not on file   Number of  children: 2   Years of education: HS   Highest education level: Not on file  Occupational History   Occupation: Retired  Tobacco Use   Smoking status: Never   Smokeless tobacco: Never  Vaping Use   Vaping Use: Never used  Substance and Sexual Activity   Alcohol use: Yes    Comment: May have one glass of wine   Drug use: No   Sexual activity: Not on file  Other Topics Concern   Not on file  Social History Narrative   Lives at home with her husband.   Right-handed.   1-2 cups caffeine per day.   Social Determinants of Health   Financial Resource Strain: Not on file  Food Insecurity: Not on file  Transportation  Needs: Not on file  Physical Activity: Not on file  Stress: Not on file  Social Connections: Not on file     Family History: The patient's family history includes Arthritis in her father; Breast cancer in her mother and sister; Depression in her father; Suicidality in her father.  ROS:   Please see the history of present illness.     All other systems reviewed and are negative.  EKGs/Labs/Other Studies Reviewed:    The following studies were reviewed today:  ECG treadmill stress test 2015  Frequent PVC's and couplets with exercise  No diagnostic ST segment changes concerning for ischemia  Fair exercise tolerance  No chest pain   Echocardiogram 2015  - Left ventricle: The cavity size was normal. Systolic function was    normal. The estimated ejection fraction was in the range of 50%    to 55%. Wall motion was normal; there were no regional wall    motion abnormalities. Doppler parameters are consistent with    abnormal left ventricular relaxation (grade 1 diastolic    dysfunction). There was no evidence of elevated ventricular    filling pressure by Doppler parameters.  - Aortic valve: Trileaflet; normal thickness leaflets. There was    trivial regurgitation.  - Aortic root: The aortic root was normal in size.  - Mitral valve: Calcified annulus. Mildly thickened leaflets .    There was trivial regurgitation.  - Right ventricle: The cavity size was normal. Wall thickness was    normal. Systolic function was normal.  - Right atrium: The atrium was normal in size.  - Tricuspid valve: There was mild regurgitation.  - Pulmonary arteries: Systolic pressure was within the normal    range.  - Inferior vena cava: The vessel was normal in size. The    respirophasic diameter changes were in the normal range (= 50%),    consistent with normal central venous pressure.  - Pericardium, extracardiac: There was no pericardial effusion.      EKG:  EKG is  ordered today.  The ekg  ordered today demonstrates sinus rhythm with a single premature ventricular contraction, generalized low voltage QRS with poor R wave progression and borderline criteria for left anterior fascicular block.  Recent Labs: No results found for requested labs within last 8760 hours.  Recent Lipid Panel    Component Value Date/Time   CHOL 213 (H) 02/26/2014 1054   TRIG 87.0 02/26/2014 1054   TRIG 64 04/12/2006 1122   HDL 83.30 02/26/2014 1054   CHOLHDL 3 02/26/2014 1054   VLDL 17.4 02/26/2014 1054   LDLCALC 112 (H) 02/26/2014 1054   LDLDIRECT 128.8 02/28/2011 1011    Risk Assessment/Calculations:  Physical Exam:    VS:  BP 112/64 (BP Location: Left Arm, Patient Position: Sitting, Cuff Size: Normal)   Pulse 77   Ht 5' 4.5" (1.638 m)   Wt 151 lb (68.5 kg)   BMI 25.52 kg/m     Wt Readings from Last 3 Encounters:  02/02/21 151 lb (68.5 kg)  02/19/17 146 lb (66.2 kg)  07/30/14 143 lb 14.4 oz (65.3 kg)     GEN:  Well nourished, well developed in no acute distress HEENT: Normal.  She has dark circles under her eyes, but otherwise a normal exam. NECK: No JVD; No carotid bruits LYMPHATICS: No lymphadenopathy CARDIAC: RRR with rare ectopic beats, no murmurs, rubs, gallops RESPIRATORY:  Clear to auscultation without rales, wheezing or rhonchi  ABDOMEN: Soft, non-tender, non-distended MUSCULOSKELETAL:  No edema; No deformity  SKIN: Warm and dry NEUROLOGIC:  Alert and oriented x 3 PSYCHIATRIC:  Normal affect   ASSESSMENT:    1. PVCs (premature ventricular contractions)   2. Nonspecific abnormal electrocardiogram (ECG) (EKG)    PLAN:    In order of problems listed above:  PVCs: These are relatively frequent, but only symptomatic in the evening.  She was not aware of ectopy while during her physical exam or during acquisition of her ECG.  Specific therapy is not indicated.  Since fatigue is one of her major complaints, I doubt she would tolerate beta-blockers. Abnormal  ECG: Low voltage on her electrocardiogram raises the concern for possible amyloidosis, but really there are no other features that would support this diagnosis.  She does not have any symptoms of true heart failure.  She had only mild indicators of relaxation impairment on a previous echo.  She does not have carpal tunnel or cervical stenosis syndrome.  There is no family history of amyloidosis.  Her ECG looks similar on her evaluation in 2015.  If she truly had amyloidosis would expect a much more rapid progression of illness.  At this point I do not think she requires a repeat of the evaluation with echo and stress testing and rhythm monitoring that was performed in 2015.  I doubt this would lead to a diagnostic breakthrough for her complaints, which are mostly noncardiac.        Medication Adjustments/Labs and Tests Ordered: Current medicines are reviewed at length with the patient today.  Concerns regarding medicines are outlined above.  Orders Placed This Encounter  Procedures   EKG 12-Lead   No orders of the defined types were placed in this encounter.   Patient Instructions  Medication Instructions:  No changes *If you need a refill on your cardiac medications before your next appointment, please call your pharmacy*   Lab Work: None ordered If you have labs (blood work) drawn today and your tests are completely normal, you will receive your results only by: [place] (if you have MyChart) OR A paper copy in the mail If you have any lab test that is abnormal or we need to change your treatment, we will call you to review the results.   Testing/Procedures: None ordered   Follow-Up: At [facility], you and your health needs are our priority.  As part of our continuing mission to provide you with exceptional heart care, we have created designated Provider Care Teams.  These Care Teams include your primary Cardiologist (physician) and Advanced Practice Providers (APPs -   Physician Assistants and Nurse Practitioners) who all work together to provide you with the care you need, when you need  it.  We recommend signing up for the patient portal called "MyChart".  Sign up information is provided on this After Visit Summary.  MyChart is used to connect with patients for Virtual Visits (Telemedicine).  Patients are able to view lab/test results, encounter notes, upcoming appointments, etc.  Non-urgent messages can be sent to your provider as well.   To learn more about what you can do with MyChart, go to [email or web address].    Your next appointment:   Follow up as needed with Dr. Sallyanne Kuster   Signed, Sanda Klein, MD  02/02/2021 8:18 PM    [place]

## 2021-02-02 NOTE — Patient Instructions (Signed)
Medication Instructions:  No changes *If you need a refill on your cardiac medications before your next appointment, please call your pharmacy*   Lab Work: None ordered If you have labs (blood work) drawn today and your tests are completely normal, you will receive your results only by: [place] (if you have MyChart) OR A paper copy in the mail If you have any lab test that is abnormal or we need to change your treatment, we will call you to review the results.   Testing/Procedures: None ordered   Follow-Up: At [facility], you and your health needs are our priority.  As part of our continuing mission to provide you with exceptional heart care, we have created designated Provider Care Teams.  These Care Teams include your primary Cardiologist (physician) and Advanced Practice Providers (APPs -  Physician Assistants and Nurse Practitioners) who all work together to provide you with the care you need, when you need it.  We recommend signing up for the patient portal called "MyChart".  Sign up information is provided on this After Visit Summary.  MyChart is used to connect with patients for Virtual Visits (Telemedicine).  Patients are able to view lab/test results, encounter notes, upcoming appointments, etc.  Non-urgent messages can be sent to your provider as well.   To learn more about what you can do with MyChart, go to [email or web address].    Your next appointment:   Follow up as needed with Dr. Sallyanne Kuster

## 2021-02-04 ENCOUNTER — Ambulatory Visit: Payer: Medicare HMO | Admitting: Cardiovascular Disease

## 2021-02-15 DIAGNOSIS — M9901 Segmental and somatic dysfunction of cervical region: Secondary | ICD-10-CM | POA: Diagnosis not present

## 2021-02-15 DIAGNOSIS — M5136 Other intervertebral disc degeneration, lumbar region: Secondary | ICD-10-CM | POA: Diagnosis not present

## 2021-02-15 DIAGNOSIS — M9903 Segmental and somatic dysfunction of lumbar region: Secondary | ICD-10-CM | POA: Diagnosis not present

## 2021-02-15 DIAGNOSIS — M5031 Other cervical disc degeneration,  high cervical region: Secondary | ICD-10-CM | POA: Diagnosis not present

## 2021-02-18 ENCOUNTER — Other Ambulatory Visit: Payer: Self-pay | Admitting: Family Medicine

## 2021-02-18 DIAGNOSIS — Z1231 Encounter for screening mammogram for malignant neoplasm of breast: Secondary | ICD-10-CM

## 2021-03-03 DIAGNOSIS — M5136 Other intervertebral disc degeneration, lumbar region: Secondary | ICD-10-CM | POA: Diagnosis not present

## 2021-03-03 DIAGNOSIS — M9901 Segmental and somatic dysfunction of cervical region: Secondary | ICD-10-CM | POA: Diagnosis not present

## 2021-03-03 DIAGNOSIS — M9903 Segmental and somatic dysfunction of lumbar region: Secondary | ICD-10-CM | POA: Diagnosis not present

## 2021-03-03 DIAGNOSIS — M5031 Other cervical disc degeneration,  high cervical region: Secondary | ICD-10-CM | POA: Diagnosis not present

## 2021-03-10 DIAGNOSIS — M9903 Segmental and somatic dysfunction of lumbar region: Secondary | ICD-10-CM | POA: Diagnosis not present

## 2021-03-10 DIAGNOSIS — M5136 Other intervertebral disc degeneration, lumbar region: Secondary | ICD-10-CM | POA: Diagnosis not present

## 2021-03-10 DIAGNOSIS — M9901 Segmental and somatic dysfunction of cervical region: Secondary | ICD-10-CM | POA: Diagnosis not present

## 2021-03-10 DIAGNOSIS — M5031 Other cervical disc degeneration,  high cervical region: Secondary | ICD-10-CM | POA: Diagnosis not present

## 2021-03-17 DIAGNOSIS — R030 Elevated blood-pressure reading, without diagnosis of hypertension: Secondary | ICD-10-CM | POA: Diagnosis not present

## 2021-03-17 DIAGNOSIS — Z803 Family history of malignant neoplasm of breast: Secondary | ICD-10-CM | POA: Diagnosis not present

## 2021-03-17 DIAGNOSIS — F3342 Major depressive disorder, recurrent, in full remission: Secondary | ICD-10-CM | POA: Diagnosis not present

## 2021-03-17 DIAGNOSIS — R269 Unspecified abnormalities of gait and mobility: Secondary | ICD-10-CM | POA: Diagnosis not present

## 2021-03-17 DIAGNOSIS — F419 Anxiety disorder, unspecified: Secondary | ICD-10-CM | POA: Diagnosis not present

## 2021-03-17 DIAGNOSIS — G8929 Other chronic pain: Secondary | ICD-10-CM | POA: Diagnosis not present

## 2021-03-17 DIAGNOSIS — M1990 Unspecified osteoarthritis, unspecified site: Secondary | ICD-10-CM | POA: Diagnosis not present

## 2021-03-17 DIAGNOSIS — R69 Illness, unspecified: Secondary | ICD-10-CM | POA: Diagnosis not present

## 2021-03-17 DIAGNOSIS — M810 Age-related osteoporosis without current pathological fracture: Secondary | ICD-10-CM | POA: Diagnosis not present

## 2021-03-17 DIAGNOSIS — E785 Hyperlipidemia, unspecified: Secondary | ICD-10-CM | POA: Diagnosis not present

## 2021-03-23 DIAGNOSIS — M9901 Segmental and somatic dysfunction of cervical region: Secondary | ICD-10-CM | POA: Diagnosis not present

## 2021-03-23 DIAGNOSIS — M5031 Other cervical disc degeneration,  high cervical region: Secondary | ICD-10-CM | POA: Diagnosis not present

## 2021-03-23 DIAGNOSIS — M9903 Segmental and somatic dysfunction of lumbar region: Secondary | ICD-10-CM | POA: Diagnosis not present

## 2021-03-23 DIAGNOSIS — M5136 Other intervertebral disc degeneration, lumbar region: Secondary | ICD-10-CM | POA: Diagnosis not present

## 2021-03-29 ENCOUNTER — Ambulatory Visit
Admission: RE | Admit: 2021-03-29 | Discharge: 2021-03-29 | Disposition: A | Discharge status: Home / Self Care | Payer: Medicare HMO | Source: Ambulatory Visit | Attending: Family Medicine | Admitting: Family Medicine

## 2021-03-29 ENCOUNTER — Other Ambulatory Visit: Payer: Self-pay

## 2021-03-29 DIAGNOSIS — Z1231 Encounter for screening mammogram for malignant neoplasm of breast: Secondary | ICD-10-CM | POA: Diagnosis not present

## 2021-04-19 DIAGNOSIS — M5136 Other intervertebral disc degeneration, lumbar region: Secondary | ICD-10-CM | POA: Diagnosis not present

## 2021-04-19 DIAGNOSIS — M9903 Segmental and somatic dysfunction of lumbar region: Secondary | ICD-10-CM | POA: Diagnosis not present

## 2021-04-19 DIAGNOSIS — M5031 Other cervical disc degeneration,  high cervical region: Secondary | ICD-10-CM | POA: Diagnosis not present

## 2021-04-19 DIAGNOSIS — M9901 Segmental and somatic dysfunction of cervical region: Secondary | ICD-10-CM | POA: Diagnosis not present

## 2021-05-19 DIAGNOSIS — M9903 Segmental and somatic dysfunction of lumbar region: Secondary | ICD-10-CM | POA: Diagnosis not present

## 2021-05-19 DIAGNOSIS — M5031 Other cervical disc degeneration,  high cervical region: Secondary | ICD-10-CM | POA: Diagnosis not present

## 2021-05-19 DIAGNOSIS — M5136 Other intervertebral disc degeneration, lumbar region: Secondary | ICD-10-CM | POA: Diagnosis not present

## 2021-05-19 DIAGNOSIS — M9901 Segmental and somatic dysfunction of cervical region: Secondary | ICD-10-CM | POA: Diagnosis not present

## 2021-06-06 ENCOUNTER — Ambulatory Visit: Payer: Medicare HMO | Admitting: Podiatry

## 2021-06-06 ENCOUNTER — Other Ambulatory Visit: Payer: Self-pay

## 2021-06-06 DIAGNOSIS — M205X1 Other deformities of toe(s) (acquired), right foot: Secondary | ICD-10-CM

## 2021-06-06 DIAGNOSIS — E162 Hypoglycemia, unspecified: Secondary | ICD-10-CM | POA: Insufficient documentation

## 2021-06-06 DIAGNOSIS — F329 Major depressive disorder, single episode, unspecified: Secondary | ICD-10-CM | POA: Insufficient documentation

## 2021-06-06 DIAGNOSIS — E785 Hyperlipidemia, unspecified: Secondary | ICD-10-CM | POA: Insufficient documentation

## 2021-06-06 DIAGNOSIS — G4700 Insomnia, unspecified: Secondary | ICD-10-CM | POA: Insufficient documentation

## 2021-06-06 DIAGNOSIS — M810 Age-related osteoporosis without current pathological fracture: Secondary | ICD-10-CM | POA: Insufficient documentation

## 2021-06-06 DIAGNOSIS — M76821 Posterior tibial tendinitis, right leg: Secondary | ICD-10-CM

## 2021-06-06 DIAGNOSIS — N183 Chronic kidney disease, stage 3 unspecified: Secondary | ICD-10-CM | POA: Insufficient documentation

## 2021-06-06 DIAGNOSIS — F331 Major depressive disorder, recurrent, moderate: Secondary | ICD-10-CM | POA: Insufficient documentation

## 2021-06-06 NOTE — Patient Instructions (Signed)
I would start with getting a new shoe. If needed we can work on adding additional arch support. If needed we can schedule you with Guadlupe Spanish in our office to help with this.

## 2021-06-09 NOTE — Progress Notes (Signed)
Subjective:   Patient ID: Dawn Harmon, female   DOB: 80 y.o.   MRN: [id number]   HPI 80 year old female presents the office with her husband concerns of her right ankle wound and words.  Not causing discomfort but she notices the ankle rolling and she does start to get some pain on the fourth and fifth toes warts around inside of her shoe.  No injury that she reports.  She is only able to wear certain shoes.  She presents today asking if she needed to change shoes or get new inserts or where she should start with this now preventing worsening.   Review of Systems  All other systems reviewed and are negative.  Past Medical History:  Diagnosis Date   Chronic fatigue fibromyalgia syndrome    CKD (chronic kidney disease)    Depression    Insomnia    Osteoarthritis    neck   Osteoporosis     Past Surgical History:  Procedure Laterality Date   BUNIONECTOMY     HERNIA REPAIR     TONSILLECTOMY       Current Outpatient Medications:    B Complex Vitamins (B COMPLEX PO), Take by mouth., Disp: , Rfl:    Cholecalciferol (VITAMIN D PO), Take by mouth daily., Disp: , Rfl:    Cranberry 300 MG tablet, Take 300 mg by mouth daily., Disp: , Rfl:    Multiple Vitamin (MULTIVITAMIN) tablet, Take 1 tablet by mouth daily., Disp: , Rfl:    Omega-3 Fatty Acids (FISH OIL PO), Take 1 tablet by mouth daily. (Patient not taking: Reported on 02/02/2021), Disp: , Rfl:    TURMERIC PO, Take by mouth daily., Disp: , Rfl:   Allergies  Allergen Reactions   Bupropion Other (See Comments)   Doxepin Hcl Other (See Comments)   Duloxetine Hcl Other (See Comments)   Hydroxyzine Other (See Comments)   Levofloxacin Other (See Comments)    insomnia   Mirtazapine Other (See Comments)   Nitrofurantoin Other (See Comments)   Serotonin Reuptake Inhibitors (Ssris) Other (See Comments)   Temazepam Other (See Comments)   Zolpidem Tartrate Other (See Comments)          Objective:  Physical Exam  General: AAO x3,  NAD  Dermatological: Skin is warm, dry and supple bilateral.There are no open sores, no preulcerative lesions, no rash or signs of infection present.  Vascular: Dorsalis Pedis artery and Posterior Tibial artery pedal pulses are 2/4 bilateral with immedate capillary fill time. There is no pain with calf compression, swelling, warmth, erythema.   Neruologic: Grossly intact via light touch bilateral.  Negative Tinel sign.  Sensation intact with Semmes Weinstein monofilament.  Musculoskeletal: Upon weightbearing evaluation there is significant pronation present of the right side worse than left.  She is able to do a single and double heel rise.  Unable to elicit any tenderness to palpation on exam today.  Adductovarus present lesser digits which are rubbing inside shoes causing discomfort but no pain on exam.  No significant edema to the foot, ankle.  Ankle range of motion intact.  Muscular strength 5/5 in all groups tested bilateral.  Gait: Unassisted, Nonantalgic.       Assessment:   Posterior tibial tendon dysfunction, pronation     Plan:  -Treatment options discussed including all alternatives, risks, and complications -Etiology of symptoms were discussed -Would like to hold off on x-rays today.  We discussed different treatment options but I would start to try to get new shoes first  and then if needed we can add additional arch support to help.  She is instructed that she will let us know then she will proceed with the arch support.  Trula Slade DPM

## 2021-07-21 DIAGNOSIS — Z79899 Other long term (current) drug therapy: Secondary | ICD-10-CM | POA: Diagnosis not present

## 2021-07-21 DIAGNOSIS — E7800 Pure hypercholesterolemia, unspecified: Secondary | ICD-10-CM | POA: Diagnosis not present

## 2021-07-21 DIAGNOSIS — G9332 Myalgic encephalomyelitis/chronic fatigue syndrome: Secondary | ICD-10-CM | POA: Diagnosis not present

## 2021-07-21 DIAGNOSIS — Z0000 Encounter for general adult medical examination without abnormal findings: Secondary | ICD-10-CM | POA: Diagnosis not present

## 2021-07-21 DIAGNOSIS — I700 Atherosclerosis of aorta: Secondary | ICD-10-CM | POA: Diagnosis not present

## 2021-07-26 DIAGNOSIS — F331 Major depressive disorder, recurrent, moderate: Secondary | ICD-10-CM | POA: Diagnosis not present

## 2021-07-26 DIAGNOSIS — F5104 Psychophysiologic insomnia: Secondary | ICD-10-CM | POA: Diagnosis not present

## 2021-07-26 DIAGNOSIS — Z0289 Encounter for other administrative examinations: Secondary | ICD-10-CM | POA: Diagnosis not present

## 2021-07-26 DIAGNOSIS — N1832 Chronic kidney disease, stage 3b: Secondary | ICD-10-CM | POA: Diagnosis not present

## 2021-07-26 DIAGNOSIS — R69 Illness, unspecified: Secondary | ICD-10-CM | POA: Diagnosis not present

## 2021-07-26 DIAGNOSIS — I700 Atherosclerosis of aorta: Secondary | ICD-10-CM | POA: Diagnosis not present

## 2021-07-26 DIAGNOSIS — G9332 Myalgic encephalomyelitis/chronic fatigue syndrome: Secondary | ICD-10-CM | POA: Diagnosis not present

## 2021-08-01 DIAGNOSIS — R5382 Chronic fatigue, unspecified: Secondary | ICD-10-CM | POA: Diagnosis not present

## 2021-08-08 DIAGNOSIS — R69 Illness, unspecified: Secondary | ICD-10-CM | POA: Diagnosis not present

## 2021-08-08 DIAGNOSIS — G9332 Myalgic encephalomyelitis/chronic fatigue syndrome: Secondary | ICD-10-CM | POA: Diagnosis not present

## 2021-08-08 DIAGNOSIS — F5104 Psychophysiologic insomnia: Secondary | ICD-10-CM | POA: Diagnosis not present

## 2021-08-08 DIAGNOSIS — Z0289 Encounter for other administrative examinations: Secondary | ICD-10-CM | POA: Diagnosis not present

## 2021-08-23 ENCOUNTER — Other Ambulatory Visit: Payer: Self-pay | Admitting: Internal Medicine

## 2021-08-23 DIAGNOSIS — N1832 Chronic kidney disease, stage 3b: Secondary | ICD-10-CM

## 2021-08-24 DIAGNOSIS — Z0289 Encounter for other administrative examinations: Secondary | ICD-10-CM | POA: Diagnosis not present

## 2021-08-24 DIAGNOSIS — G9332 Myalgic encephalomyelitis/chronic fatigue syndrome: Secondary | ICD-10-CM | POA: Diagnosis not present

## 2021-08-24 DIAGNOSIS — R69 Illness, unspecified: Secondary | ICD-10-CM | POA: Diagnosis not present

## 2021-08-26 ENCOUNTER — Ambulatory Visit
Admission: RE | Admit: 2021-08-26 | Discharge: 2021-08-26 | Disposition: A | Discharge status: Home / Self Care | Payer: Medicare HMO | Source: Ambulatory Visit | Attending: Internal Medicine | Admitting: Internal Medicine

## 2021-08-26 DIAGNOSIS — N189 Chronic kidney disease, unspecified: Secondary | ICD-10-CM | POA: Diagnosis not present

## 2021-08-26 DIAGNOSIS — N1832 Chronic kidney disease, stage 3b: Secondary | ICD-10-CM

## 2021-09-06 DIAGNOSIS — M5031 Other cervical disc degeneration,  high cervical region: Secondary | ICD-10-CM | POA: Diagnosis not present

## 2021-09-06 DIAGNOSIS — M9903 Segmental and somatic dysfunction of lumbar region: Secondary | ICD-10-CM | POA: Diagnosis not present

## 2021-09-06 DIAGNOSIS — M9901 Segmental and somatic dysfunction of cervical region: Secondary | ICD-10-CM | POA: Diagnosis not present

## 2021-09-06 DIAGNOSIS — M5136 Other intervertebral disc degeneration, lumbar region: Secondary | ICD-10-CM | POA: Diagnosis not present

## 2021-09-08 DIAGNOSIS — N1832 Chronic kidney disease, stage 3b: Secondary | ICD-10-CM | POA: Diagnosis not present

## 2021-09-08 DIAGNOSIS — R69 Illness, unspecified: Secondary | ICD-10-CM | POA: Diagnosis not present

## 2021-09-08 DIAGNOSIS — F5104 Psychophysiologic insomnia: Secondary | ICD-10-CM | POA: Diagnosis not present

## 2021-09-08 DIAGNOSIS — Z0289 Encounter for other administrative examinations: Secondary | ICD-10-CM | POA: Diagnosis not present

## 2021-09-19 DIAGNOSIS — Z809 Family history of malignant neoplasm, unspecified: Secondary | ICD-10-CM | POA: Diagnosis not present

## 2021-09-19 DIAGNOSIS — R69 Illness, unspecified: Secondary | ICD-10-CM | POA: Diagnosis not present

## 2021-09-20 DIAGNOSIS — M5031 Other cervical disc degeneration,  high cervical region: Secondary | ICD-10-CM | POA: Diagnosis not present

## 2021-09-20 DIAGNOSIS — M9901 Segmental and somatic dysfunction of cervical region: Secondary | ICD-10-CM | POA: Diagnosis not present

## 2021-09-20 DIAGNOSIS — M9903 Segmental and somatic dysfunction of lumbar region: Secondary | ICD-10-CM | POA: Diagnosis not present

## 2021-09-20 DIAGNOSIS — M5136 Other intervertebral disc degeneration, lumbar region: Secondary | ICD-10-CM | POA: Diagnosis not present

## 2021-10-04 DIAGNOSIS — M5136 Other intervertebral disc degeneration, lumbar region: Secondary | ICD-10-CM | POA: Diagnosis not present

## 2021-10-04 DIAGNOSIS — M9901 Segmental and somatic dysfunction of cervical region: Secondary | ICD-10-CM | POA: Diagnosis not present

## 2021-10-04 DIAGNOSIS — M9903 Segmental and somatic dysfunction of lumbar region: Secondary | ICD-10-CM | POA: Diagnosis not present

## 2021-10-04 DIAGNOSIS — M5031 Other cervical disc degeneration,  high cervical region: Secondary | ICD-10-CM | POA: Diagnosis not present

## 2021-10-24 DIAGNOSIS — Z0289 Encounter for other administrative examinations: Secondary | ICD-10-CM | POA: Diagnosis not present

## 2021-10-24 DIAGNOSIS — R69 Illness, unspecified: Secondary | ICD-10-CM | POA: Diagnosis not present

## 2021-10-24 DIAGNOSIS — F5104 Psychophysiologic insomnia: Secondary | ICD-10-CM | POA: Diagnosis not present

## 2021-11-10 DIAGNOSIS — M9901 Segmental and somatic dysfunction of cervical region: Secondary | ICD-10-CM | POA: Diagnosis not present

## 2021-11-10 DIAGNOSIS — M5136 Other intervertebral disc degeneration, lumbar region: Secondary | ICD-10-CM | POA: Diagnosis not present

## 2021-11-10 DIAGNOSIS — M9903 Segmental and somatic dysfunction of lumbar region: Secondary | ICD-10-CM | POA: Diagnosis not present

## 2021-11-10 DIAGNOSIS — M5031 Other cervical disc degeneration,  high cervical region: Secondary | ICD-10-CM | POA: Diagnosis not present

## 2021-11-21 DIAGNOSIS — R69 Illness, unspecified: Secondary | ICD-10-CM | POA: Diagnosis not present

## 2021-11-21 DIAGNOSIS — N1832 Chronic kidney disease, stage 3b: Secondary | ICD-10-CM | POA: Diagnosis not present

## 2021-11-21 DIAGNOSIS — F5104 Psychophysiologic insomnia: Secondary | ICD-10-CM | POA: Diagnosis not present

## 2021-11-21 DIAGNOSIS — Z0289 Encounter for other administrative examinations: Secondary | ICD-10-CM | POA: Diagnosis not present

## 2021-11-30 DIAGNOSIS — N1832 Chronic kidney disease, stage 3b: Secondary | ICD-10-CM | POA: Diagnosis not present

## 2021-12-06 DIAGNOSIS — M5136 Other intervertebral disc degeneration, lumbar region: Secondary | ICD-10-CM | POA: Diagnosis not present

## 2021-12-06 DIAGNOSIS — M9903 Segmental and somatic dysfunction of lumbar region: Secondary | ICD-10-CM | POA: Diagnosis not present

## 2021-12-06 DIAGNOSIS — M5031 Other cervical disc degeneration,  high cervical region: Secondary | ICD-10-CM | POA: Diagnosis not present

## 2021-12-06 DIAGNOSIS — M9901 Segmental and somatic dysfunction of cervical region: Secondary | ICD-10-CM | POA: Diagnosis not present

## 2021-12-19 DIAGNOSIS — Z0289 Encounter for other administrative examinations: Secondary | ICD-10-CM | POA: Diagnosis not present

## 2021-12-19 DIAGNOSIS — R69 Illness, unspecified: Secondary | ICD-10-CM | POA: Diagnosis not present

## 2021-12-19 DIAGNOSIS — M810 Age-related osteoporosis without current pathological fracture: Secondary | ICD-10-CM | POA: Diagnosis not present

## 2022-01-02 DIAGNOSIS — I8392 Asymptomatic varicose veins of left lower extremity: Secondary | ICD-10-CM | POA: Diagnosis not present

## 2022-01-02 DIAGNOSIS — D225 Melanocytic nevi of trunk: Secondary | ICD-10-CM | POA: Diagnosis not present

## 2022-01-02 DIAGNOSIS — L821 Other seborrheic keratosis: Secondary | ICD-10-CM | POA: Diagnosis not present

## 2022-01-02 DIAGNOSIS — L814 Other melanin hyperpigmentation: Secondary | ICD-10-CM | POA: Diagnosis not present

## 2022-01-11 DIAGNOSIS — M9901 Segmental and somatic dysfunction of cervical region: Secondary | ICD-10-CM | POA: Diagnosis not present

## 2022-01-11 DIAGNOSIS — M9903 Segmental and somatic dysfunction of lumbar region: Secondary | ICD-10-CM | POA: Diagnosis not present

## 2022-01-11 DIAGNOSIS — M5031 Other cervical disc degeneration,  high cervical region: Secondary | ICD-10-CM | POA: Diagnosis not present

## 2022-01-11 DIAGNOSIS — M5136 Other intervertebral disc degeneration, lumbar region: Secondary | ICD-10-CM | POA: Diagnosis not present

## 2022-01-30 DIAGNOSIS — F5104 Psychophysiologic insomnia: Secondary | ICD-10-CM | POA: Diagnosis not present

## 2022-01-30 DIAGNOSIS — Z5948 Other specified lack of adequate food: Secondary | ICD-10-CM | POA: Diagnosis not present

## 2022-01-30 DIAGNOSIS — Z599 Problem related to housing and economic circumstances, unspecified: Secondary | ICD-10-CM | POA: Diagnosis not present

## 2022-01-30 DIAGNOSIS — R69 Illness, unspecified: Secondary | ICD-10-CM | POA: Diagnosis not present

## 2022-01-30 DIAGNOSIS — Z0289 Encounter for other administrative examinations: Secondary | ICD-10-CM | POA: Diagnosis not present

## 2022-01-30 DIAGNOSIS — N1832 Chronic kidney disease, stage 3b: Secondary | ICD-10-CM | POA: Diagnosis not present

## 2022-01-30 DIAGNOSIS — I700 Atherosclerosis of aorta: Secondary | ICD-10-CM | POA: Diagnosis not present

## 2022-02-28 DIAGNOSIS — M9901 Segmental and somatic dysfunction of cervical region: Secondary | ICD-10-CM | POA: Diagnosis not present

## 2022-02-28 DIAGNOSIS — M5136 Other intervertebral disc degeneration, lumbar region: Secondary | ICD-10-CM | POA: Diagnosis not present

## 2022-02-28 DIAGNOSIS — M5031 Other cervical disc degeneration,  high cervical region: Secondary | ICD-10-CM | POA: Diagnosis not present

## 2022-02-28 DIAGNOSIS — M9903 Segmental and somatic dysfunction of lumbar region: Secondary | ICD-10-CM | POA: Diagnosis not present

## 2022-05-12 ENCOUNTER — Encounter (HOSPITAL_BASED_OUTPATIENT_CLINIC_OR_DEPARTMENT_OTHER): Payer: Self-pay | Admitting: Emergency Medicine

## 2022-05-12 ENCOUNTER — Emergency Department (HOSPITAL_BASED_OUTPATIENT_CLINIC_OR_DEPARTMENT_OTHER): Payer: Medicare HMO

## 2022-05-12 ENCOUNTER — Inpatient Hospital Stay (HOSPITAL_BASED_OUTPATIENT_CLINIC_OR_DEPARTMENT_OTHER)
Admission: EM | Admit: 2022-05-12 | Discharge: 2022-05-14 | DRG: 065 | Disposition: A | Discharge status: Home / Self Care | Payer: Medicare HMO | Attending: Family Medicine | Admitting: Family Medicine

## 2022-05-12 ENCOUNTER — Other Ambulatory Visit: Payer: Self-pay

## 2022-05-12 DIAGNOSIS — R262 Difficulty in walking, not elsewhere classified: Secondary | ICD-10-CM | POA: Diagnosis present

## 2022-05-12 DIAGNOSIS — M797 Fibromyalgia: Secondary | ICD-10-CM | POA: Diagnosis present

## 2022-05-12 DIAGNOSIS — R269 Unspecified abnormalities of gait and mobility: Secondary | ICD-10-CM | POA: Diagnosis present

## 2022-05-12 DIAGNOSIS — I340 Nonrheumatic mitral (valve) insufficiency: Secondary | ICD-10-CM | POA: Diagnosis present

## 2022-05-12 DIAGNOSIS — Z79899 Other long term (current) drug therapy: Secondary | ICD-10-CM

## 2022-05-12 DIAGNOSIS — R531 Weakness: Secondary | ICD-10-CM | POA: Diagnosis present

## 2022-05-12 DIAGNOSIS — Z888 Allergy status to other drugs, medicaments and biological substances status: Secondary | ICD-10-CM

## 2022-05-12 DIAGNOSIS — M1990 Unspecified osteoarthritis, unspecified site: Secondary | ICD-10-CM | POA: Diagnosis present

## 2022-05-12 DIAGNOSIS — I6381 Other cerebral infarction due to occlusion or stenosis of small artery: Secondary | ICD-10-CM | POA: Diagnosis not present

## 2022-05-12 DIAGNOSIS — Z8261 Family history of arthritis: Secondary | ICD-10-CM

## 2022-05-12 DIAGNOSIS — N1831 Chronic kidney disease, stage 3a: Secondary | ICD-10-CM | POA: Diagnosis present

## 2022-05-12 DIAGNOSIS — E785 Hyperlipidemia, unspecified: Secondary | ICD-10-CM | POA: Diagnosis present

## 2022-05-12 DIAGNOSIS — Z818 Family history of other mental and behavioral disorders: Secondary | ICD-10-CM

## 2022-05-12 DIAGNOSIS — F32A Depression, unspecified: Secondary | ICD-10-CM | POA: Diagnosis present

## 2022-05-12 DIAGNOSIS — N183 Chronic kidney disease, stage 3 unspecified: Secondary | ICD-10-CM | POA: Diagnosis present

## 2022-05-12 DIAGNOSIS — I639 Cerebral infarction, unspecified: Secondary | ICD-10-CM | POA: Diagnosis not present

## 2022-05-12 DIAGNOSIS — I493 Ventricular premature depolarization: Secondary | ICD-10-CM | POA: Diagnosis present

## 2022-05-12 DIAGNOSIS — G9332 Myalgic encephalomyelitis/chronic fatigue syndrome: Secondary | ICD-10-CM | POA: Diagnosis present

## 2022-05-12 DIAGNOSIS — I472 Ventricular tachycardia, unspecified: Secondary | ICD-10-CM | POA: Diagnosis present

## 2022-05-12 DIAGNOSIS — M810 Age-related osteoporosis without current pathological fracture: Secondary | ICD-10-CM | POA: Diagnosis present

## 2022-05-12 LAB — CBC WITH DIFFERENTIAL/PLATELET
Abs Immature Granulocytes: 0 10*3/uL (ref 0.00–0.07)
Basophils Absolute: 0 10*3/uL (ref 0.0–0.1)
Basophils Relative: 1 %
Eosinophils Absolute: 0.1 10*3/uL (ref 0.0–0.5)
Eosinophils Relative: 2 %
HCT: 43.8 % (ref 36.0–46.0)
Hemoglobin: 14.9 g/dL (ref 12.0–15.0)
Immature Granulocytes: 0 %
Lymphocytes Relative: 32 %
Lymphs Abs: 1.7 10*3/uL (ref 0.7–4.0)
MCH: 31.8 pg (ref 26.0–34.0)
MCHC: 34 g/dL (ref 30.0–36.0)
MCV: 93.6 fL (ref 80.0–100.0)
Monocytes Absolute: 0.4 10*3/uL (ref 0.1–1.0)
Monocytes Relative: 8 %
Neutro Abs: 3 10*3/uL (ref 1.7–7.7)
Neutrophils Relative %: 57 %
Platelets: 339 10*3/uL (ref 150–400)
RBC: 4.68 MIL/uL (ref 3.87–5.11)
RDW: 13.8 % (ref 11.5–15.5)
WBC: 5.2 10*3/uL (ref 4.0–10.5)
nRBC: 0 % (ref 0.0–0.2)

## 2022-05-12 LAB — BASIC METABOLIC PANEL
Anion gap: 11 (ref 5–15)
BUN: 22 mg/dL (ref 8–23)
CO2: 23 mmol/L (ref 22–32)
Calcium: 9.9 mg/dL (ref 8.9–10.3)
Chloride: 104 mmol/L (ref 98–111)
Creatinine, Ser: 1.14 mg/dL — ABNORMAL HIGH (ref 0.44–1.00)
GFR, Estimated: 48 mL/min — ABNORMAL LOW (ref >60)
Glucose, Bld: 93 mg/dL (ref 70–99)
Potassium: 4.3 mmol/L (ref 3.5–5.1)
Sodium: 138 mmol/L (ref 135–145)

## 2022-05-12 LAB — TROPONIN I (HIGH SENSITIVITY): Troponin I (High Sensitivity): 4 ng/L (ref <18)

## 2022-05-12 LAB — MAGNESIUM: Magnesium: 2.4 mg/dL (ref 1.7–2.4)

## 2022-05-12 NOTE — ED Triage Notes (Signed)
Woke at 5:30 AM  Weakness/ limp right arm went back to sleep. Improvement  Now reporting feeling weakness and droopy in arm.  Provider to eval in triage

## 2022-05-13 ENCOUNTER — Other Ambulatory Visit: Payer: Self-pay

## 2022-05-13 ENCOUNTER — Emergency Department (HOSPITAL_COMMUNITY): Payer: Medicare HMO

## 2022-05-13 ENCOUNTER — Inpatient Hospital Stay (HOSPITAL_COMMUNITY): Payer: Medicare HMO

## 2022-05-13 DIAGNOSIS — F32A Depression, unspecified: Secondary | ICD-10-CM | POA: Diagnosis present

## 2022-05-13 DIAGNOSIS — I63312 Cerebral infarction due to thrombosis of left middle cerebral artery: Secondary | ICD-10-CM

## 2022-05-13 DIAGNOSIS — I639 Cerebral infarction, unspecified: Secondary | ICD-10-CM | POA: Diagnosis present

## 2022-05-13 DIAGNOSIS — Z818 Family history of other mental and behavioral disorders: Secondary | ICD-10-CM | POA: Diagnosis not present

## 2022-05-13 DIAGNOSIS — I4729 Other ventricular tachycardia: Secondary | ICD-10-CM | POA: Diagnosis not present

## 2022-05-13 DIAGNOSIS — Z888 Allergy status to other drugs, medicaments and biological substances status: Secondary | ICD-10-CM | POA: Diagnosis not present

## 2022-05-13 DIAGNOSIS — I472 Ventricular tachycardia, unspecified: Secondary | ICD-10-CM | POA: Diagnosis present

## 2022-05-13 DIAGNOSIS — I493 Ventricular premature depolarization: Secondary | ICD-10-CM | POA: Diagnosis present

## 2022-05-13 DIAGNOSIS — I6381 Other cerebral infarction due to occlusion or stenosis of small artery: Secondary | ICD-10-CM | POA: Diagnosis present

## 2022-05-13 DIAGNOSIS — M797 Fibromyalgia: Secondary | ICD-10-CM | POA: Diagnosis present

## 2022-05-13 DIAGNOSIS — N1831 Chronic kidney disease, stage 3a: Secondary | ICD-10-CM | POA: Diagnosis present

## 2022-05-13 DIAGNOSIS — I340 Nonrheumatic mitral (valve) insufficiency: Secondary | ICD-10-CM | POA: Diagnosis present

## 2022-05-13 DIAGNOSIS — R269 Unspecified abnormalities of gait and mobility: Secondary | ICD-10-CM | POA: Diagnosis present

## 2022-05-13 DIAGNOSIS — I6389 Other cerebral infarction: Secondary | ICD-10-CM | POA: Diagnosis not present

## 2022-05-13 DIAGNOSIS — E785 Hyperlipidemia, unspecified: Secondary | ICD-10-CM | POA: Diagnosis present

## 2022-05-13 DIAGNOSIS — Z79899 Other long term (current) drug therapy: Secondary | ICD-10-CM | POA: Diagnosis not present

## 2022-05-13 DIAGNOSIS — G9332 Myalgic encephalomyelitis/chronic fatigue syndrome: Secondary | ICD-10-CM | POA: Diagnosis present

## 2022-05-13 DIAGNOSIS — M810 Age-related osteoporosis without current pathological fracture: Secondary | ICD-10-CM | POA: Diagnosis present

## 2022-05-13 DIAGNOSIS — Z8261 Family history of arthritis: Secondary | ICD-10-CM | POA: Diagnosis not present

## 2022-05-13 DIAGNOSIS — R531 Weakness: Secondary | ICD-10-CM | POA: Diagnosis present

## 2022-05-13 DIAGNOSIS — E7800 Pure hypercholesterolemia, unspecified: Secondary | ICD-10-CM | POA: Diagnosis not present

## 2022-05-13 DIAGNOSIS — M1990 Unspecified osteoarthritis, unspecified site: Secondary | ICD-10-CM | POA: Diagnosis present

## 2022-05-13 DIAGNOSIS — R262 Difficulty in walking, not elsewhere classified: Secondary | ICD-10-CM | POA: Diagnosis present

## 2022-05-13 LAB — LIPID PANEL
Cholesterol: 224 mg/dL — ABNORMAL HIGH (ref 0–200)
HDL: 77 mg/dL (ref >40)
LDL Cholesterol: 132 mg/dL — ABNORMAL HIGH (ref 0–99)
Total CHOL/HDL Ratio: 2.9 RATIO
Triglycerides: 74 mg/dL (ref <150)
VLDL: 15 mg/dL (ref 0–40)

## 2022-05-13 LAB — TROPONIN I (HIGH SENSITIVITY): Troponin I (High Sensitivity): 10 ng/L (ref <18)

## 2022-05-13 LAB — HEMOGLOBIN A1C
Hgb A1c MFr Bld: 5.2 % (ref 4.8–5.6)
Mean Plasma Glucose: 102.54 mg/dL

## 2022-05-13 LAB — SEDIMENTATION RATE: Sed Rate: 11 mm/hr (ref 0–22)

## 2022-05-13 LAB — MAGNESIUM: Magnesium: 2.4 mg/dL (ref 1.7–2.4)

## 2022-05-13 LAB — TSH: TSH: 1.741 u[IU]/mL (ref 0.350–4.500)

## 2022-05-13 MED ORDER — CLOPIDOGREL BISULFATE 75 MG PO TABS
75.0000 mg | ORAL_TABLET | Freq: Every day | ORAL | Status: DC
  Administered 2022-05-14: 75 mg via ORAL
  Filled 2022-05-13: qty 1

## 2022-05-13 MED ORDER — ASPIRIN 325 MG PO TABS
325.0000 mg | ORAL_TABLET | Freq: Once | ORAL | Status: AC
  Administered 2022-05-13: 325 mg via ORAL
  Filled 2022-05-13: qty 1

## 2022-05-13 MED ORDER — ASPIRIN 81 MG PO TBEC
81.0000 mg | DELAYED_RELEASE_TABLET | Freq: Every day | ORAL | Status: DC
  Administered 2022-05-14: 81 mg via ORAL
  Filled 2022-05-13: qty 1

## 2022-05-13 MED ORDER — ADULT MULTIVITAMIN W/MINERALS CH
1.0000 | ORAL_TABLET | Freq: Every day | ORAL | Status: DC
  Administered 2022-05-13 – 2022-05-14 (×2): 1 via ORAL
  Filled 2022-05-13 (×2): qty 1

## 2022-05-13 MED ORDER — CLOPIDOGREL BISULFATE 300 MG PO TABS
300.0000 mg | ORAL_TABLET | Freq: Once | ORAL | Status: AC
  Administered 2022-05-13: 300 mg via ORAL
  Filled 2022-05-13: qty 1

## 2022-05-13 MED ORDER — SODIUM CHLORIDE 0.9 % IV SOLN: INTRAVENOUS | Status: DC

## 2022-05-13 MED ORDER — IOHEXOL 350 MG/ML SOLN
75.0000 mL | Freq: Once | INTRAVENOUS | Status: AC | PRN
  Administered 2022-05-13: 75 mL via INTRAVENOUS

## 2022-05-13 MED ORDER — ESCITALOPRAM OXALATE 10 MG PO TABS
5.0000 mg | ORAL_TABLET | Freq: Every day | ORAL | Status: DC
  Administered 2022-05-13 – 2022-05-14 (×2): 5 mg via ORAL
  Filled 2022-05-13 (×2): qty 1

## 2022-05-13 MED ORDER — STROKE: EARLY STAGES OF RECOVERY BOOK
Freq: Once | Status: AC
  Filled 2022-05-13: qty 1

## 2022-05-13 MED ORDER — ENOXAPARIN SODIUM 40 MG/0.4ML IJ SOSY
40.0000 mg | PREFILLED_SYRINGE | INTRAMUSCULAR | Status: DC
  Administered 2022-05-13 – 2022-05-14 (×2): 40 mg via SUBCUTANEOUS
  Filled 2022-05-13 (×2): qty 0.4

## 2022-05-13 NOTE — Progress Notes (Addendum)
STROKE TEAM PROGRESS NOTE   ATTENDING NOTE: I reviewed above note and agree with the assessment and plan. Pt was seen and examined.   81 year old female with history of hyperlipidemia, CKD admitted for right sided weakness and improving.  CT no acute abnormality.  MRI showed left BG/CR infarct.  CTA head and neck unremarkable.  2D echo pending.  A1c and LDL pending.  Creatinine 1.14.  On exam, patient neurologically intact, no focal deficit.  Etiology for patient stroke likely due to small vessel disease given location.  Recommend aspirin Plavix 3 weeks and then aspirin alone.  We will decide on statin once LDL available.  However, during encounter, patient telemetry showed frequent short run of V. tach.  Discussed with Dr. Tamala Julian, will curbside cardiology in a.m. May consider 30-day or loop recorder if needed.  For detailed assessment and plan, please refer to above/below as I have made changes wherever appropriate.   Dawn Hawking, Dawn Harmon Stroke Neurology 05/13/2022 6:48 PM  I discussed with Dr. Tamala Julian. I spent extra 30 inpatient minutes in total face-to-face time with the patient, more than 50% of which was spent in counseling and coordination of care, reviewing test results, images and medication, and discussing the diagnosis, treatment plan and potential prognosis. This patient's care requiresreview of multiple databases, neurological assessment, discussion with family, other specialists and medical decision making of high complexity.      INTERVAL HISTORY Patient is seen sitting up in hallway bed, in no acute distress. No family at bedside.  Right-sided symptoms resolved on exam. Patient remains hesitant to take medications but agrees to ASA + Plavix for 3 weeks. Further medication recommendations pending lipid panel.   Vitals:   05/13/22 0100 05/13/22 0215 05/13/22 0500 05/13/22 0600  BP: 136/84 133/86 (!) 120/108 (!) 137/105  Pulse: 75 75 (!) 54 91  Resp: '12 14 18 17  '$ Temp: 98 F  (36.7 C)     TempSrc: Oral     SpO2: 99% 96% 92% 97%   CBC:  Recent Labs  Lab 05/12/22 1838  WBC 5.2  NEUTROABS 3.0  HGB 14.9  HCT 43.8  MCV 93.6  PLT 654   Basic Metabolic Panel:  Recent Labs  Lab 05/12/22 1838  NA 138  K 4.3  CL 104  CO2 23  GLUCOSE 93  BUN 22  CREATININE 1.14*  CALCIUM 9.9  MG 2.4   Lipid Panel: No results for input(s): "CHOL", "TRIG", "HDL", "CHOLHDL", "VLDL", "LDLCALC" in the last 168 hours. HgbA1c: No results for input(s): "HGBA1C" in the last 168 hours. Urine Drug Screen: No results for input(s): "LABOPIA", "COCAINSCRNUR", "LABBENZ", "AMPHETMU", "THCU", "LABBARB" in the last 168 hours.  Alcohol Level No results for input(s): "ETH" in the last 168 hours.  IMAGING past 24 hours MR BRAIN WO CONTRAST  Result Date: 05/13/2022 CLINICAL DATA:  81 year old female with right side weakness. Neurologic deficit. EXAM: MRI HEAD WITHOUT CONTRAST TECHNIQUE: Multiplanar, multiecho pulse sequences of the brain and surrounding structures were obtained without intravenous contrast. COMPARISON:  Head CT 05/12/2022.  Brain MRI 01/12/2017. FINDINGS: Brain: Cerebral volume has not significantly changed since 2018 and appears normal for age. Small and relatively subtle nodular, curvilinear restricted diffusion in the left posterior lentiform at the junction with the posterior limb external capsule (series 5 images 75 through 72). Faint if any T2 and FLAIR hyperintensity. No hemorrhage or mass effect. No other No restricted diffusion to suggest acute infarction. No midline shift, mass effect, evidence of mass lesion, ventriculomegaly, extra-axial  collection or acute intracranial hemorrhage. Cervicomedullary junction and pituitary are within normal limits. Background gray and white matter signal has not significantly changed from 2018 and remains largely normal for age. No cortical encephalomalacia or chronic cerebral blood products identified. Minor T2 heterogeneity in the right  pons. Vascular: Major intracranial vascular flow voids are stable since 2018. Skull and upper cervical spine: Normal for age visible cervical spine. Visualized bone marrow signal is within normal limits. Sinuses/Orbits: Postoperative changes to both globes since 2018. Paranasal sinuses and mastoids are stable and well aerated. Other: Grossly normal visible internal auditory structures. Negative visible scalp and face. IMPRESSION: 1. Positive for a small, subtle acute lacunar infarct in the Left posterior lentiform/external capsule. No associated hemorrhage or mass effect. 2. Otherwise normal for age noncontrast MRI appearance of the brain. Electronically Signed   By: Genevie Ann M.D.   On: 05/13/2022 04:11   CT Cervical Spine Wo Contrast  Result Date: 05/12/2022 CLINICAL DATA:  Right arm weakness neuro deficit, acute stroke suspected EXAM: CT HEAD WITHOUT CONTRAST CT CERVICAL SPINE WITHOUT CONTRAST TECHNIQUE: Multidetector CT imaging of the head and cervical spine was performed following the standard protocol without intravenous contrast. Multiplanar CT image reconstructions of the cervical spine were also generated. RADIATION DOSE REDUCTION: This exam was performed according to the departmental dose-optimization program which includes automated exposure control, adjustment of the mA and/or kV according to patient size and/or use of iterative reconstruction technique. COMPARISON:  None Available. FINDINGS: CT HEAD FINDINGS Brain: No evidence of acute infarction, hemorrhage, hydrocephalus, extra-axial collection or mass lesion/mass effect. Mild periventricular white matter hypodensity. Vascular: No hyperdense vessel or unexpected calcification. Skull: Normal. Negative for fracture or focal lesion. Sinuses/Orbits: No acute finding. Other: None. CT CERVICAL SPINE FINDINGS Alignment: Degenerative straightening of the normal cervical lordosis. Skull base and vertebrae: No acute fracture. No primary bone lesion or focal  pathologic process. Soft tissues and spinal canal: No prevertebral fluid or swelling. No visible canal hematoma. Disc levels: Focally severe disc space height loss and osteophytosis of C5-C6 with otherwise mild multilevel disc degenerative disease. Upper chest: Negative. Other: None. IMPRESSION: 1. No acute intracranial pathology. Small-vessel white matter disease. 2. No fracture or static subluxation of the cervical spine. 3. Focally severe disc space height loss and osteophytosis of C5-C6 with otherwise mild multilevel disc degenerative disease. Electronically Signed   By: Delanna Ahmadi M.D.   On: 05/12/2022 19:05   CT Head Wo Contrast  Result Date: 05/12/2022 CLINICAL DATA:  Right arm weakness neuro deficit, acute stroke suspected EXAM: CT HEAD WITHOUT CONTRAST CT CERVICAL SPINE WITHOUT CONTRAST TECHNIQUE: Multidetector CT imaging of the head and cervical spine was performed following the standard protocol without intravenous contrast. Multiplanar CT image reconstructions of the cervical spine were also generated. RADIATION DOSE REDUCTION: This exam was performed according to the departmental dose-optimization program which includes automated exposure control, adjustment of the mA and/or kV according to patient size and/or use of iterative reconstruction technique. COMPARISON:  None Available. FINDINGS: CT HEAD FINDINGS Brain: No evidence of acute infarction, hemorrhage, hydrocephalus, extra-axial collection or mass lesion/mass effect. Mild periventricular white matter hypodensity. Vascular: No hyperdense vessel or unexpected calcification. Skull: Normal. Negative for fracture or focal lesion. Sinuses/Orbits: No acute finding. Other: None. CT CERVICAL SPINE FINDINGS Alignment: Degenerative straightening of the normal cervical lordosis. Skull base and vertebrae: No acute fracture. No primary bone lesion or focal pathologic process. Soft tissues and spinal canal: No prevertebral fluid or swelling. No visible  canal hematoma.  Disc levels: Focally severe disc space height loss and osteophytosis of C5-C6 with otherwise mild multilevel disc degenerative disease. Upper chest: Negative. Other: None. IMPRESSION: 1. No acute intracranial pathology. Small-vessel white matter disease. 2. No fracture or static subluxation of the cervical spine. 3. Focally severe disc space height loss and osteophytosis of C5-C6 with otherwise mild multilevel disc degenerative disease. Electronically Signed   By: Delanna Ahmadi M.D.   On: 05/12/2022 19:05   DG Chest Portable 1 View  Result Date: 05/12/2022 CLINICAL DATA:  Weakness in right upper extremity EXAM: PORTABLE CHEST 1 VIEW COMPARISON:  03/07/2012 FINDINGS: Cardiac size is within normal limits. There are no signs of pulmonary edema. There are new transverse linear densities in both lower lung fields. There is no pleural effusion or pneumothorax. There is a possible new sclerotic density in the body of T9 vertebra. This finding is not adequately evaluated without the lateral view. Degenerative changes are noted in right shoulder. IMPRESSION: There are new transverse linear densities in both lower lung fields suggesting scarring or subsegmental atelectasis. There is no focal pulmonary consolidation. There are no signs of pulmonary edema. There is no pleural effusion or pneumothorax. There is possible sclerotic density in the body of T9 vertebra. Follow-up routine AP and lateral views of thoracic spine may be considered for further characterization. Degenerative changes are noted in right shoulder. Electronically Signed   By: Elmer Picker M.D.   On: 05/12/2022 19:00    PHYSICAL EXAM  Physical Exam  Constitutional: Appears well-developed and well-nourished.  Psych: Affect appropriate to situation, patient is calm and cooperative with exam  Eyes: No scleral injection HENT: No OP obstrucion MSK: no joint deformities.  Cardiovascular: Normal rate and regular rhythm with  frequent PVCs Respiratory: Effort normal, non-labored breathing GI: Soft.  No distension. There is no tenderness.  Skin: WDI  Neuro: Mental Status: Patient is awake, alert, oriented to person, place, month, year, and situation. Patient is able to give a clear and coherent history. No signs of aphasia or neglect Cranial Nerves: II: Visual Fields are full. Pupils are equal, round, and reactive to light.   III,IV, VI: EOMI without ptosis or diploplia.  V: Facial sensation is symmetric to light touch  VII: Facial movement is symmetric resting and smiling VIII: Hearing is intact to voice X: Palate elevates symmetrically XI: Shoulder shrug is symmetric. XII: Tongue protrudes midline without atrophy or fasciculations.  Motor: Tone is normal. Bulk is normal. 5/5 strength was present in all four extremities without vertical drift or asymmetry  Sensory: Sensation is symmetric to light touch and temperature in the arms and legs. No extinction to DSS present.  Cerebellar: FNF and HKS are intact bilaterally  ASSESSMENT/PLAN Dawn Harmon is a 81 y.o. female with history of HLD, chronic fatigue, CKD, depression, osteoporosis presenting with right upper and lower extremity weakness and right foot dragging with ambulation.   Stroke: acute lacunar infarction in the left posterior lentiform/external capsule. Etiology likely small vessel disease.  CT head 11/10: No acute abnormality. Small vessel white matter disease.  CTA head & neck negative for LVO. Generalized arterial tortuosity but minimal atherosclerosis in the head and neck with no arterial stenosis.  MRI  small, subtle acute lacunar infarction in the left posterior lentiform/external capsule.  2D Echo pending  LDL No results found for requested labs within last 1095 days. HgbA1c No results found for requested labs within last 1095 days. VTE prophylaxis - Lovenox 40 mg SQ  Diet   Diet Heart Room service appropriate? Yes; Fluid  consistency: Thin   No antithrombotic prior to admission, now on aspirin 81 mg daily and clopidogrel 75 mg daily for 21 days followed by aspirin monotherapy  Therapy recommendations:  Pending  Disposition:  Pending   Hypertension Home meds:  None Stable Permissive hypertension (OK if < 220/120) but gradually normalize in 5-7 days Long-term BP goal normotensive  Hyperlipidemia Home meds:  None LDL pending, goal < 70 High intensity statin recommendation pending lipid panel completion  Continue statin at discharge  Other Stroke Risk Factors Advanced Age >/= 61   Other Active Problems CKD  Hospital day # 0  Anibal Henderson, AGACNP-BC [facility] Pager: [phone]  To contact Stroke Continuity provider, please refer to [email or web address] After hours, contact General Neurology

## 2022-05-13 NOTE — ED Notes (Signed)
Pt arrived by [facility]. Pt has had multiple runs of PVC and V-tach en route. No systems or change to VS

## 2022-05-13 NOTE — Progress Notes (Signed)
PT Cancellation Note  Patient Details Name: Dawn Harmon MRN: [id number] DOB: 12-Oct-1940   Cancelled Treatment:    Reason Eval/Treat Not Completed: Patient at procedure or test/unavailable.  Meal arrived, then in BR, then going to CT.  Retry as time and pt allow.   Ramond Dial 05/13/2022, 11:49 AM  Mee Hives, PT PhD Acute Rehab Dept. Number: [place] and [place]

## 2022-05-13 NOTE — Consult Note (Signed)
Neurology Consultation Reason for Consult: Stroke on MRI Requesting Physician: Merrily Pew  CC: Right-sided weakness, fluctuating  History is obtained from: Patient and chart review  HPI: Dawn Harmon is a 81 y.o. left-handed female with a past medical history significant for hyperlipidemia, chronic fatigue/fibromyalgia, CKD, depression, osteoporosis.  She notes she was in her normal state of health last when she went to bed at night on Thursday.  On Friday morning when she woke up she had severe weakness of the right upper extremity which improved but has not fully gone back to normal.  Subsequently she had severe weakness of the right lower extremity which also nearly normalized.  She does not recall any sensory deficits, denies any other focal neurological deficits  LKW: 11/9 10 PM Thrombolytic given?: No, out of the window IA performed?: No, exam not consistent with LVO Premorbid modified rankin scale:      1 - No significant disability. Able to carry out all usual activities, despite some symptoms.  ROS: All other review of systems was negative except as noted in the HPI.   Past Medical History:  Diagnosis Date   Chronic fatigue fibromyalgia syndrome    CKD (chronic kidney disease)    Depression    Insomnia    Osteoarthritis    neck   Osteoporosis    Past Surgical History:  Procedure Laterality Date   BUNIONECTOMY     HERNIA REPAIR     TONSILLECTOMY     Current Outpatient Medications  Medication Instructions   B Complex Vitamins (B COMPLEX PO) Oral   buPROPion ER (WELLBUTRIN SR) 100 mg, Oral, Every morning   cephALEXin (KEFLEX) 250 mg, Oral, 3 times daily   Cholecalciferol (VITAMIN D PO) Oral, Daily   ciprofloxacin (CIPRO) 250 mg, Oral, 2 times daily   Cranberry 300 mg, Daily   escitalopram (LEXAPRO) 5 mg, Oral, Daily   Multiple Vitamin (MULTIVITAMIN) tablet 1 tablet, Daily   Omega-3 Fatty Acids (FISH OIL PO) 1 tablet, Daily   traZODone (DESYREL) 25 mg, Oral,  Daily at bedtime   TURMERIC PO Oral, Daily     Family History  Problem Relation Age of Onset   Breast cancer Mother    Suicidality Father    Arthritis Father    Depression Father    Breast cancer Sister     Social History:  reports that she has never smoked. She has never used smokeless tobacco. She reports current alcohol use. She reports that she does not use drugs.   Exam: Current vital signs: BP 133/86   Pulse 75   Temp 98 F (36.7 C) (Oral)   Resp 14   SpO2 96%  Vital signs in last 24 hours: Temp:  [97.8 F (36.6 C)-98 F (36.7 C)] 98 F (36.7 C) (11/11 0100) Pulse Rate:  [69-84] 75 (11/11 0215) Resp:  [12-18] 14 (11/11 0215) BP: (122-143)/(84-103) 133/86 (11/11 0215) SpO2:  [91 %-99 %] 96 % (11/11 0215)   Physical Exam  Constitutional: Appears well-developed and well-nourished.  Psych: Affect mildly anxious, cooperative Eyes: No scleral injection HENT: No oropharyngeal obstruction.  MSK: Some arthritic changes of the hands Cardiovascular: Normal rate and regular rhythm. Perfusing extremities well Respiratory: Effort normal, non-labored breathing GI: Soft.  No distension. There is no tenderness.  Skin: Warm dry and intact visible skin  Neuro: Mental Status: Patient is awake, alert, oriented to person, place, month, year, and situation. Patient is able to give a clear and coherent history. No signs of aphasia or  neglect Cranial Nerves: II: Visual Fields are full. Pupils are equal, round, and reactive to light.   III,IV, VI: EOMI with mild bilateral symmetric ptosis, no diploplia.  V: Facial sensation is symmetric to light touch VII: Facial movement is symmetric.  VIII: hearing is intact to voice X: Uvula elevates symmetrically XI: Shoulder shrug is symmetric. XII: tongue is midline without atrophy or fasciculations.  Motor: Tone is normal. Bulk is normal. 5/5 strength was present in all four extremities, other than 4+/5 right hip flexion, 4+/5 wrist  extension on the right.  Fine finger movements are slightly slowed on the right Sensory: Sensation is symmetric to light touch throughout Deep Tendon Reflexes: 2+ and symmetric in the brachioradialis and patellae.  Cerebellar: FNF and HKS are intact bilaterally Gait:  Deferred in acute setting   NIHSS total 0 Performed at 515 AM   I have reviewed labs in epic and the results pertinent to this consultation are:  Basic Metabolic Panel: Recent Labs  Lab 05/12/22 1838  NA 138  K 4.3  CL 104  CO2 23  GLUCOSE 93  BUN 22  CREATININE 1.14*  CALCIUM 9.9  MG 2.4    CBC: Recent Labs  Lab 05/12/22 1838  WBC 5.2  NEUTROABS 3.0  HGB 14.9  HCT 43.8  MCV 93.6  PLT 339    Coagulation Studies: No results for input(s): "LABPROT", "INR" in the last 72 hours.   Lab Results  Component Value Date   CHOL 213 (H) 02/26/2014   HDL 83.30 02/26/2014   LDLCALC 112 (H) 02/26/2014   LDLDIRECT 128.8 02/28/2011   TRIG 87.0 02/26/2014   CHOLHDL 3 02/26/2014   No results found for: "HGBA1C"   I have reviewed the images obtained:  MRI brain personally reviewed, agree with radiology:   1. Positive for a small, subtle acute lacunar infarct in the Left posterior lentiform/external capsule. No associated hemorrhage or mass effect. 2. Otherwise normal for age noncontrast MRI appearance of the brain.   Impression: 81 year old with past medical history of hyperlipidemia not on any medications, CAD, chronic pain/fibromyalgia presenting with a lacunar stroke with stuttering quality, secondary to small vessel disease.  Of note she is very hesitant to take medications due to side effects but is willing to take aspirin and a short course of Plavix.  She is still considering whether or not she would take a statin if her cholesterol results high  Recommendations: #Left lacunar stroke  - Stroke labs TSH, ESR, RPR, HgbA1c, fasting lipid panel - MRI brain  - CTA or MRA of the brain without contrast  and Carotid duplex or MRA neck w/wo  - Frequent neuro checks - Echocardiogram - Prophylactic therapy-Antiplatelet med: Aspirin - dose 367m PO or 3039mPR, followed by 81 mg daily - Consider Plavix 300 mg load with 75 mg daily for 21 - 90 day course  - Risk factor modification - Telemetry monitoring; 30 day event monitor on discharge if no arrythmias captured  - Blood pressure goal   -Given it has been more than 24 hours and patient is tolerating normotension without worsening symptoms, continue normotension blood pressure goal - PT consult, OT consult, Speech consult, unless patient is back to baseline - Stroke team to follow    [phone] 7 AM to 7 PM, outside these hours please contact Neurologist on call listed on AMION

## 2022-05-13 NOTE — ED Notes (Signed)
Pt transported to MRI 

## 2022-05-13 NOTE — H&P (Addendum)
History and Physical    Patient: Dawn Harmon OYD:[id number] DOB: 09/08/1940 DOA: 05/12/2022 DOS: the patient was seen and examined on 05/13/2022 PCP: Harlan Stains, MD  Patient coming from: Home  Chief Complaint:  Chief Complaint  Patient presents with   Extremity Weakness   HPI: Dawn Harmon is a 81 y.o. left-handed female with medical history significant of hyperlipidemia, CKD stage IIIa, chronic fatigue fibromyalgia syndrome, depression, osteoporosis presents with complaints of right leg weakness.  Patient had reportedly woke up at around 5:30 AM yesterday morning and noted that her right arm was limp while she is getting back into the bed.  She went back to sleep and woke up at 8:30 AM and noted that symptoms seem to have resolved.  She took her husband to his doctor appointment around 9:15 AM.  They went to the [employer] and thereafter [employer] where she had trouble getting out of the car and around 12 PM due to weakness in her right leg.  While in the store she reported having to hold onto things around her to keep from falling.  Normally she is able to ambulate without need of assistance.  Denies having any recent headache, palpitations, chest pain, cough, shortness of breath, nausea, vomiting, diarrhea, recent sick contacts to her knowledge.  She cares for her husband who has some health wounds for which they have not found a clear cause despite being seen by neurology and other specialist.  In the emergency department patient was noted to have relatively stable vital signs.  Labs are relatively unremarkable. CT scan of the head and neck noted no acute abnormality.  Patient was out of the window for thrombolytics.  Neurology had evaluated patient and recommended completion of stroke work-up.  MRI revealed small subtle acute lacunar infarct in the left posterior lenticular form/external capsule.  Patient was loaded with aspirin and Plavix.  Review of Systems: As mentioned in the  history of present illness. All other systems reviewed and are negative. Past Medical History:  Diagnosis Date   Chronic fatigue fibromyalgia syndrome    CKD (chronic kidney disease)    Depression    Insomnia    Osteoarthritis    neck   Osteoporosis    Past Surgical History:  Procedure Laterality Date   BUNIONECTOMY     HERNIA REPAIR     TONSILLECTOMY     Social History:  reports that she has never smoked. She has never used smokeless tobacco. She reports current alcohol use. She reports that she does not use drugs.  Allergies  Allergen Reactions   Bupropion Other (See Comments)    Unknown reaction   Doxepin Hcl Other (See Comments)    Unknown reaction    Duloxetine Hcl Other (See Comments)    Unknown reaction    Hydroxyzine Other (See Comments)    Unknown reaction    Levofloxacin Other (See Comments)    insomnia   Mirtazapine Other (See Comments)    Unknown reaction    Nitrofurantoin Other (See Comments)    Unknown reaction    Serotonin Reuptake Inhibitors (Ssris) Other (See Comments)    Unknown reaction    Temazepam Other (See Comments)    Unknown reaction    Zolpidem Tartrate Other (See Comments)    Unknown reaction     Family History  Problem Relation Age of Onset   Breast cancer Mother    Suicidality Father    Arthritis Father    Depression Father  Breast cancer Sister     Prior to Admission medications   Medication Sig Start Date End Date Taking? Authorizing Provider  ASHWAGANDHA PO Take 1 Capful by mouth daily at 12 noon.   Yes [provider]  Cranberry 300 MG tablet Take 300 mg by mouth daily.   Yes [provider]  escitalopram (LEXAPRO) 5 MG tablet Take 5 mg by mouth daily. 03/13/22  Yes [provider]  Multiple Vitamin (MULTIVITAMIN) tablet Take 1 tablet by mouth daily.   Yes [provider]  TURMERIC PO Take 1 capsule by mouth daily.   Yes [provider]    Physical Exam: Vitals:    05/13/22 0100 05/13/22 0215 05/13/22 0500 05/13/22 0600  BP: 136/84 133/86 (!) 120/108 (!) 137/105  Pulse: 75 75 (!) 54 91  Resp: '12 14 18 17  '$ Temp: 98 F (36.7 C)     TempSrc: Oral     SpO2: 99% 96% 92% 97%   Constitutional: Elderly female currently in no acute distress Eyes: PERRL, lids and conjunctivae normal ENMT: Mucous membranes are moist.   Neck: normal, supple, no JVD appreciated. Respiratory: clear to auscultation bilaterally, no wheezing,  . Normal respiratory effort. No accessory muscle use.  Cardiovascular: Appears to have a regular rhythm with extra beats intermittently.  Trace lower extremity swelling. Abdomen: no tenderness, no masses palpated.  Bowel sounds positive.  Musculoskeletal: no clubbing / cyanosis. No joint deformity upper and lower extremities. Good ROM, no contractures. Normal muscle tone.  Skin: no rashes, lesions, ulcers. No induration Neurologic: CN 2-12 grossly intact.   Strength 5/5 in all 4.  Psychiatric: Normal judgment and insight. Alert and oriented x 3. Normal mood.   Data Reviewed:  EKG reveals sinus rhythm at 69 bpm with paired PVCs.  Assessment and Plan: CVA Acute.  Patient presented with complaints of right arm weakness which resolved followed by right leg weakness yesterday.  MRI of the brain showed a small subtle acute lacunar infarct in the left posterior lenticular form/external capsule. -Admit to telemetry bed -Stroke order set initiated -Neuro checks -Check Hemoglobin A1c, lipid panel, TSH, -Check CTA of the head and neck -Check echocardiogram -PT/OT/Speech to eval and treat -Continue aspirin 81 mg daily and Plavix 75 mg daily for 90 days then aspirin alone -Transfer of care consulted -[facility], will follow-up   Frequent PVCs Patient noted to have frequent PVCs on telemetry monitoring. -Check magnesium level -Follow-up telemetry overnight.  Message sent to Gay Filler to set patient up with  Holter monitor -Consider discussing with cardiology formally in a.m.   CKD stage IIIa Chronic.  Creatinine 1.14 which appears around patient's baseline when reviewing prior records. -Continue to monitor  Hyperlipidemia Patient has had elevated LDL levels in the past greater than 100. -Follow-up lipid panel  Depression chronic fatigue syndrome fibromyalgia -Check TSH -Continue Lexapro  DVT prophylaxis: Lovenox Advance Care Planning:   Code Status: Full Code    Consults: Neurology  Family Communication: Husband updated over the phone  Severity of Illness: The appropriate patient status for this patient is INPATIENT. Inpatient status is judged to be reasonable and necessary in order to provide the required intensity of service to ensure the patient's safety. The patient's presenting symptoms, physical exam findings, and initial radiographic and laboratory data in the context of their chronic comorbidities is felt to place them at high risk for further clinical deterioration. Furthermore, it is not anticipated that the patient will be medically stable for discharge  from the hospital within 2 midnights of admission.   * I certify that at the point of admission it is my clinical judgment that the patient will require inpatient hospital care spanning beyond 2 midnights from the point of admission due to high intensity of service, high risk for further deterioration and high frequency of surveillance required.*  Author: Norval Morton, MD 05/13/2022 8:46 AM  For on call review [email or web address].

## 2022-05-13 NOTE — ED Provider Notes (Signed)
  Physical Exam  BP 133/86   Pulse 75   Temp 98 F (36.7 C) (Oral)   Resp 14   SpO2 96%   Physical Exam Vitals and nursing note reviewed.  Neurological:     Comments: No altered mental status, able to give full seemingly accurate history.  Face is symmetric, EOM's intact, pupils equal and reactive, vision intact, tongue and uvula midline without deviation. Upper and Lower extremity motor 5/5, intact pain perception in distal extremities, 2+ reflexes in patella and achilles tendons.      Procedures  .Critical Care  Performed by: Merrily Pew, MD Authorized by: Merrily Pew, MD   Critical care provider statement:    Critical care time (minutes):  30   Critical care was necessary to treat or prevent imminent or life-threatening deterioration of the following conditions:  CNS failure or compromise   Critical care was time spent personally by me on the following activities:  Development of treatment plan with patient or surrogate, discussions with consultants, evaluation of patient's response to treatment, examination of patient, ordering and review of laboratory studies, ordering and review of radiographic studies, ordering and performing treatments and interventions, pulse oximetry, re-evaluation of patient's condition and review of old charts   ED Course / MDM    Medical Decision Making Amount and/or Complexity of Data Reviewed Radiology: ordered.  Risk Decision regarding hospitalization.   Patient transferred from [facility] for evaluation ration for stroke.  MRI does show she has a small acute stroke.  Discussed with the recommends Plavix and aspirin and they will see her for any further recommendations.  I discussed it with the hospitalist for admission.  Exam is improved at this time and is likely near baseline.       Kalise Fickett, Corene Cornea, MD 05/13/22 [phone]

## 2022-05-13 NOTE — ED Provider Notes (Signed)
[facility] EMERGENCY DEPT Provider Note   CSN: [id number] Arrival date & time: 05/12/22  1753     History  Chief Complaint  Patient presents with   Extremity Weakness    Dawn Harmon is a 81 y.o. female.  HPI     This is an 81 year old female with a history of chronic fatigue, osteoporosis, CKD who presents with weakness.  Patient reports that she woke up around 5:30 AM this morning and noted her right arm to be heavy.  She had used her left arm to move her right arm.  She went back to sleep.  At 830 she woke up and her symptoms have resolved.  However, she noted after she took her husband to an office visit that around noon today she began to have trouble with her right leg.  She states that it feels heavy.  She states that she was having trouble walking and keeping herself upright.  She had to hold onto the shelves at the grocery store.  She denies any headache.  She denies any speech disturbance.  She is left-handed.  Home Medications Prior to Admission medications   Medication Sig Start Date End Date Taking? Authorizing Provider  B Complex Vitamins (B COMPLEX PO) Take by mouth.    [provider]  Cholecalciferol (VITAMIN D PO) Take by mouth daily.    [provider]  Cranberry 300 MG tablet Take 300 mg by mouth daily.    [provider]  Multiple Vitamin (MULTIVITAMIN) tablet Take 1 tablet by mouth daily.    [provider]  Omega-3 Fatty Acids (FISH OIL PO) Take 1 tablet by mouth daily. Patient not taking: Reported on 02/02/2021    [provider]  TURMERIC PO Take by mouth daily.    [provider]      Allergies    Bupropion, Doxepin hcl, Duloxetine hcl, Hydroxyzine, Levofloxacin, Mirtazapine, Nitrofurantoin, Serotonin reuptake inhibitors (ssris), Temazepam, and Zolpidem tartrate    Review of Systems   Review of Systems  Constitutional:  Negative for fever.  Respiratory:  Negative for shortness of  breath.   Cardiovascular:  Negative for chest pain.  Neurological:  Positive for weakness and numbness. Negative for facial asymmetry, speech difficulty and headaches.  All other systems reviewed and are negative.   Physical Exam Updated Vital Signs BP 127/86   Pulse 71   Temp 97.8 F (36.6 C) (Oral)   Resp 16   SpO2 91%  Physical Exam Vitals and nursing note reviewed.  Constitutional:      Appearance: She is well-developed. She is not ill-appearing.  HENT:     Head: Normocephalic and atraumatic.  Eyes:     Pupils: Pupils are equal, round, and reactive to light.  Cardiovascular:     Rate and Rhythm: Normal rate and regular rhythm.     Heart sounds: Normal heart sounds.  Pulmonary:     Effort: Pulmonary effort is normal. No respiratory distress.     Breath sounds: No wheezing.  Abdominal:     Palpations: Abdomen is soft.     Tenderness: There is no abdominal tenderness.  Musculoskeletal:     Cervical back: Neck supple.  Skin:    General: Skin is warm and dry.  Neurological:     Mental Status: She is alert and oriented to person, place, and time.     Comments: Fluent speech, cranial nerves II through XII intact, intact grip, biceps, triceps strength bilaterally, she does have mild drift on  the right upper extremity, 4+ out of 5 proximal muscle strength on the right lower extremity with drift noted, normal strength left lower extremity  Psychiatric:        Mood and Affect: Mood normal.     ED Results / Procedures / Treatments   Labs (all labs ordered are listed, but only abnormal results are displayed) Labs Reviewed  BASIC METABOLIC PANEL - Abnormal; Notable for the following components:      Result Value   Creatinine, Ser 1.14 (*)    GFR, Estimated 48 (*)    All other components within normal limits  CBC WITH DIFFERENTIAL/PLATELET  MAGNESIUM  TROPONIN I (HIGH SENSITIVITY)  TROPONIN I (HIGH SENSITIVITY)    EKG None  Radiology CT Cervical Spine Wo  Contrast  Result Date: 05/12/2022 CLINICAL DATA:  Right arm weakness neuro deficit, acute stroke suspected EXAM: CT HEAD WITHOUT CONTRAST CT CERVICAL SPINE WITHOUT CONTRAST TECHNIQUE: Multidetector CT imaging of the head and cervical spine was performed following the standard protocol without intravenous contrast. Multiplanar CT image reconstructions of the cervical spine were also generated. RADIATION DOSE REDUCTION: This exam was performed according to the departmental dose-optimization program which includes automated exposure control, adjustment of the mA and/or kV according to patient size and/or use of iterative reconstruction technique. COMPARISON:  None Available. FINDINGS: CT HEAD FINDINGS Brain: No evidence of acute infarction, hemorrhage, hydrocephalus, extra-axial collection or mass lesion/mass effect. Mild periventricular white matter hypodensity. Vascular: No hyperdense vessel or unexpected calcification. Skull: Normal. Negative for fracture or focal lesion. Sinuses/Orbits: No acute finding. Other: None. CT CERVICAL SPINE FINDINGS Alignment: Degenerative straightening of the normal cervical lordosis. Skull base and vertebrae: No acute fracture. No primary bone lesion or focal pathologic process. Soft tissues and spinal canal: No prevertebral fluid or swelling. No visible canal hematoma. Disc levels: Focally severe disc space height loss and osteophytosis of C5-C6 with otherwise mild multilevel disc degenerative disease. Upper chest: Negative. Other: None. IMPRESSION: 1. No acute intracranial pathology. Small-vessel white matter disease. 2. No fracture or static subluxation of the cervical spine. 3. Focally severe disc space height loss and osteophytosis of C5-C6 with otherwise mild multilevel disc degenerative disease. Electronically Signed   By: Delanna Ahmadi M.D.   On: 05/12/2022 19:05   CT Head Wo Contrast  Result Date: 05/12/2022 CLINICAL DATA:  Right arm weakness neuro deficit, acute  stroke suspected EXAM: CT HEAD WITHOUT CONTRAST CT CERVICAL SPINE WITHOUT CONTRAST TECHNIQUE: Multidetector CT imaging of the head and cervical spine was performed following the standard protocol without intravenous contrast. Multiplanar CT image reconstructions of the cervical spine were also generated. RADIATION DOSE REDUCTION: This exam was performed according to the departmental dose-optimization program which includes automated exposure control, adjustment of the mA and/or kV according to patient size and/or use of iterative reconstruction technique. COMPARISON:  None Available. FINDINGS: CT HEAD FINDINGS Brain: No evidence of acute infarction, hemorrhage, hydrocephalus, extra-axial collection or mass lesion/mass effect. Mild periventricular white matter hypodensity. Vascular: No hyperdense vessel or unexpected calcification. Skull: Normal. Negative for fracture or focal lesion. Sinuses/Orbits: No acute finding. Other: None. CT CERVICAL SPINE FINDINGS Alignment: Degenerative straightening of the normal cervical lordosis. Skull base and vertebrae: No acute fracture. No primary bone lesion or focal pathologic process. Soft tissues and spinal canal: No prevertebral fluid or swelling. No visible canal hematoma. Disc levels: Focally severe disc space height loss and osteophytosis of C5-C6 with otherwise mild multilevel disc degenerative disease. Upper chest: Negative. Other: None. IMPRESSION: 1. No acute  intracranial pathology. Small-vessel white matter disease. 2. No fracture or static subluxation of the cervical spine. 3. Focally severe disc space height loss and osteophytosis of C5-C6 with otherwise mild multilevel disc degenerative disease. Electronically Signed   By: Delanna Ahmadi M.D.   On: 05/12/2022 19:05   DG Chest Portable 1 View  Result Date: 05/12/2022 CLINICAL DATA:  Weakness in right upper extremity EXAM: PORTABLE CHEST 1 VIEW COMPARISON:  03/07/2012 FINDINGS: Cardiac size is within normal limits.  There are no signs of pulmonary edema. There are new transverse linear densities in both lower lung fields. There is no pleural effusion or pneumothorax. There is a possible new sclerotic density in the body of T9 vertebra. This finding is not adequately evaluated without the lateral view. Degenerative changes are noted in right shoulder. IMPRESSION: There are new transverse linear densities in both lower lung fields suggesting scarring or subsegmental atelectasis. There is no focal pulmonary consolidation. There are no signs of pulmonary edema. There is no pleural effusion or pneumothorax. There is possible sclerotic density in the body of T9 vertebra. Follow-up routine AP and lateral views of thoracic spine may be considered for further characterization. Degenerative changes are noted in right shoulder. Electronically Signed   By: Elmer Picker M.D.   On: 05/12/2022 19:00    Procedures Procedures    Medications Ordered in ED Medications - No data to display  ED Course/ Medical Decision Making/ A&P                           Medical Decision Making Amount and/or Complexity of Data Reviewed Radiology: ordered.   This patient presents to the ED for concern of weakness, this involves an extensive number of treatment options, and is a complaint that carries with it a high risk of complications and morbidity.  I considered the following differential and admission for this acute, potentially life threatening condition.  The differential diagnosis includes stroke, paresthesia, metabolic derangement  MDM:    This is an 81 year old female who presents with weakness.  She is nontoxic and vital signs are reassuring.  Mostly weakness on the right.  She is out of the stroke window.  She reports profound weakness in the right arm earlier this morning that completely resolved but has since developed right lower extremity weakness and gait disturbance secondary to this.  She has some drift on exam and  some mild weakness in the right lower extremity.  Work-up here has been negative including CT imaging.  Basic lab work is at the patient's baseline.  She did have a chest x-ray which showed possible a sclerotic density at T9.  She is not having any back pain.  Will defer further imaging at this time.  Discussed with the patient and her husband transfer to [facility] for MRI as I suspect she may have had a stroke.  (Labs, imaging, consults)  Labs: I Ordered, and personally interpreted labs.  The pertinent results include: CBC, BMP, Trop  Imaging Studies ordered: I ordered imaging studies including chest x-ray, CT imaging head neck I independently visualized and interpreted imaging. I agree with the radiologist interpretation  Additional history obtained from husband at bedside.  External records from outside source obtained and reviewed including evaluations  Cardiac Monitoring: The patient was maintained on a cardiac monitor.  I personally viewed and interpreted the cardiac monitored which showed an underlying rhythm of: Sinus rhythm with frequent PVCs  Reevaluation: After the interventions  noted above, I reevaluated the patient and found that they have :stayed the same  Social Determinants of Health:  lives independently  Disposition: Discharge  Co morbidities that complicate the patient evaluation  Past Medical History:  Diagnosis Date   Chronic fatigue fibromyalgia syndrome    CKD (chronic kidney disease)    Depression    Insomnia    Osteoarthritis    neck   Osteoporosis      Medicines No orders of the defined types were placed in this encounter.   I have reviewed the patients home medicines and have made adjustments as needed  Problem List / ED Course: Problem List Items Addressed This Visit   None               Final Clinical Impression(s) / ED Diagnoses Final diagnoses:  None    Rx / DC Orders ED Discharge Orders     None         Lorely Bubb,  Barbette Hair, MD 05/13/22 [phone]

## 2022-05-14 ENCOUNTER — Other Ambulatory Visit: Payer: Self-pay | Admitting: Neurology

## 2022-05-14 ENCOUNTER — Inpatient Hospital Stay (HOSPITAL_COMMUNITY): Payer: Medicare HMO

## 2022-05-14 DIAGNOSIS — I6389 Other cerebral infarction: Secondary | ICD-10-CM | POA: Diagnosis not present

## 2022-05-14 DIAGNOSIS — I493 Ventricular premature depolarization: Secondary | ICD-10-CM | POA: Diagnosis not present

## 2022-05-14 DIAGNOSIS — I4729 Other ventricular tachycardia: Secondary | ICD-10-CM | POA: Diagnosis not present

## 2022-05-14 DIAGNOSIS — I63312 Cerebral infarction due to thrombosis of left middle cerebral artery: Secondary | ICD-10-CM | POA: Diagnosis not present

## 2022-05-14 DIAGNOSIS — I639 Cerebral infarction, unspecified: Secondary | ICD-10-CM | POA: Diagnosis not present

## 2022-05-14 DIAGNOSIS — N1831 Chronic kidney disease, stage 3a: Secondary | ICD-10-CM | POA: Diagnosis not present

## 2022-05-14 LAB — ECHOCARDIOGRAM COMPLETE
Calc EF: 64 %
Height: 64 in
S' Lateral: 3.1 cm
Single Plane A2C EF: 65.9 %
Single Plane A4C EF: 64.8 %
Weight: 2451.52 oz

## 2022-05-14 LAB — BASIC METABOLIC PANEL
Anion gap: 9 (ref 5–15)
BUN: 13 mg/dL (ref 8–23)
CO2: 18 mmol/L — ABNORMAL LOW (ref 22–32)
Calcium: 8.7 mg/dL — ABNORMAL LOW (ref 8.9–10.3)
Chloride: 111 mmol/L (ref 98–111)
Creatinine, Ser: 1.18 mg/dL — ABNORMAL HIGH (ref 0.44–1.00)
GFR, Estimated: 46 mL/min — ABNORMAL LOW (ref >60)
Glucose, Bld: 91 mg/dL (ref 70–99)
Potassium: 3.9 mmol/L (ref 3.5–5.1)
Sodium: 138 mmol/L (ref 135–145)

## 2022-05-14 LAB — PHOSPHORUS: Phosphorus: 3.4 mg/dL (ref 2.5–4.6)

## 2022-05-14 LAB — RPR: RPR Ser Ql: NONREACTIVE

## 2022-05-14 LAB — MAGNESIUM: Magnesium: 2.2 mg/dL (ref 1.7–2.4)

## 2022-05-14 MED ORDER — ROSUVASTATIN CALCIUM 20 MG PO TABS
20.0000 mg | ORAL_TABLET | Freq: Every day | ORAL | Status: DC
  Administered 2022-05-14: 20 mg via ORAL
  Filled 2022-05-14: qty 1

## 2022-05-14 MED ORDER — CLOPIDOGREL BISULFATE 75 MG PO TABS: 75.0000 mg | ORAL_TABLET | Freq: Every day | ORAL | 0 refills | Status: DC

## 2022-05-14 MED ORDER — ASPIRIN 81 MG PO TBEC: 81.0000 mg | DELAYED_RELEASE_TABLET | Freq: Every day | ORAL | Status: AC

## 2022-05-14 MED ORDER — ROSUVASTATIN CALCIUM 20 MG PO TABS: 20.0000 mg | ORAL_TABLET | Freq: Every day | ORAL | 2 refills | Status: DC

## 2022-05-14 MED ORDER — METOPROLOL SUCCINATE ER 25 MG PO TB24: 25.0000 mg | ORAL_TABLET | Freq: Every day | ORAL | 2 refills | Status: DC

## 2022-05-14 MED ORDER — METOPROLOL SUCCINATE ER 25 MG PO TB24
25.0000 mg | ORAL_TABLET | Freq: Every day | ORAL | Status: DC
  Administered 2022-05-14: 25 mg via ORAL
  Filled 2022-05-14: qty 1

## 2022-05-14 NOTE — Progress Notes (Signed)
Patient was given discharge instructions and she stated understanding.

## 2022-05-14 NOTE — Progress Notes (Signed)
Patient having runs of vtach. No known cardiac history. Patient is asymptomatic. RN will obtain EKG per standing order. Attending paged via [place] and notified.

## 2022-05-14 NOTE — Evaluation (Signed)
Occupational Therapy Evaluation Patient Details Name: Dawn Harmon MRN: [id number] DOB: 07-05-1940 Today's Date: 05/14/2022   History of Present Illness Pt is an 81 y.o. female presenting with R sided weakness. MRI revealed small, subtle acute lacunar infarct in the left posterior lentiform/external capsule. PMH significant for hyperlipidemia, CKD stage IIIa, chronic fatigue fibromyalgia syndrome, depression, osteoporosis presents with complaints of right leg weakness.   Clinical Impression   PTA, pt lived with her husband and was independent in ADL, IADL, and driving. Upon eval, pt presents with decreased activity tolerance in which she reports is her "usual" as she has chronic fatigue. Pt with skill to describe iun detail how she manages her fatigue and associated decreased activity tolerance and brain fog during ADL and IADL. Pt performing all ADL independently during session. Some tangential conversation throughout, but feel this is her baseline. No acute or follow up needs identified. Reviewing energy conservation, compensatory strategies for memory and medication management, and signs and symptoms of stroke. OT to sign off.      Recommendations for follow up therapy are one component of a multi-disciplinary discharge planning process, led by the attending physician.  Recommendations may be updated based on patient status, additional functional criteria and insurance authorization.   Follow Up Recommendations  No OT follow up    Assistance Recommended at Discharge PRN  Patient can return home with the following Other (comment) (as needed when fatigued)    Functional Status Assessment  Patient has had a recent decline in their functional status and demonstrates the ability to make significant improvements in function in a reasonable and predictable amount of time.  Equipment Recommendations  None recommended by OT    Recommendations for Other Services       Precautions /  Restrictions Precautions Precautions: None Restrictions Weight Bearing Restrictions: No      Mobility Bed Mobility Overal bed mobility: Independent                  Transfers Overall transfer level: Independent Equipment used: None                      Balance Overall balance assessment: Mild deficits observed, not formally tested                                         ADL either performed or assessed with clinical judgement   ADL Overall ADL's : Independent                                       General ADL Comments: donning shoes, brushing teeth, path finding, and folding blanket independently.     Vision Baseline Vision/History: 1 Wears glasses Ability to See in Adequate Light: 0 Adequate Patient Visual Report: No change from baseline Vision Assessment?: No apparent visual deficits Additional Comments: WFL for tasks assessed     Perception     Praxis      Pertinent Vitals/Pain Pain Assessment Pain Assessment: No/denies pain     Hand Dominance Left   Extremity/Trunk Assessment Upper Extremity Assessment Upper Extremity Assessment: Overall WFL for tasks assessed (Initially RUE was slightly weaker, but with sustained grasp, pt with equal strength BUE)   Lower Extremity Assessment Lower Extremity Assessment: Overall WFL for tasks assessed   Cervical /  Trunk Assessment Cervical / Trunk Assessment: Normal   Communication Communication Communication: No difficulties   Cognition Arousal/Alertness: Awake/alert Behavior During Therapy: WFL for tasks assessed/performed Overall Cognitive Status: Within Functional Limits for tasks assessed                                 General Comments: WFL for tasks assessed. Able to perform stating months of year in reverse order, follow 3+ step commands. Mild memory deficits, but compensates well. Able to verbalize how she compensates when experiencing brain fog  from chronic fatigue during medication management, etc.     General Comments  VSS. Pt wants to go home.    Exercises     Shoulder Instructions      Home Living Family/patient expects to be discharged to:: Private residence Living Arrangements: Spouse/significant other Available Help at Discharge: Family;Available 24 hours/day Type of Home: House Home Access: Stairs to enter [employer] of Steps: flight as they come in from basement garage. Pt reports 6 steps on front door Entrance Stairs-Rails: Right;Left;Can reach both Home Layout: Multi-level;Able to live on main level with bedroom/bathroom Alternate Level Stairs-Number of Steps: flight Alternate Level Stairs-Rails: Right;Left;Can reach both Bathroom Shower/Tub: Occupational psychologist: Standard     Home Equipment: Shower seat - built Medical sales representative (2 wheels);Cane - single point;BSC/3in1          Prior Functioning/Environment Prior Level of Function : Independent/Modified Independent             Mobility Comments: no AD ADLs Comments: Mod I for incrased time and breaks; perfroms community IADL        OT Problem List: Impaired balance (sitting and/or standing)      OT Treatment/Interventions:      OT Goals(Current goals can be found in the care plan section) Acute Rehab OT Goals Patient Stated Goal: go home OT Goal Formulation: With patient  OT Frequency:      Co-evaluation              AM-PAC OT "6 Clicks" Daily Activity     Outcome Measure Help from another person eating meals?: None Help from another person taking care of personal grooming?: None Help from another person toileting, which includes using toliet, bedpan, or urinal?: None Help from another person bathing (including washing, rinsing, drying)?: None Help from another person to put on and taking off regular upper body clothing?: None Help from another person to put on and taking off regular lower body  clothing?: None 6 Click Score: 24   End of Session Equipment Utilized During Treatment: Gait belt Nurse Communication: Mobility status  Activity Tolerance: Patient tolerated treatment well Patient left: in bed;with call bell/phone within reach;with bed alarm set  OT Visit Diagnosis: Unsteadiness on feet (R26.81)                Time: 3546-5681 OT Time Calculation (min): 37 min Charges:  OT General Charges $OT Visit: 1 Visit OT Evaluation $OT Eval Low Complexity: 1 Low OT Treatments $Self Care/Home Management : 8-22 mins  Shanda Howells, OTR/L [facility] Acute Rehabilitation Office: [phone]   Dawn Harmon 05/14/2022, 10:22 AM

## 2022-05-14 NOTE — Progress Notes (Signed)
Echocardiogram 2D Echocardiogram has been performed.  Fidel Levy 05/14/2022, 10:15 AM

## 2022-05-14 NOTE — Progress Notes (Signed)
Patient had 15 pvcs. Patient is asymptomatic and in no acute distress. Attending notified via [place]. No new orders.

## 2022-05-14 NOTE — Progress Notes (Addendum)
STROKE TEAM PROGRESS NOTE    ATTENDING NOTE: I reviewed above note and agree with the assessment and plan.   No acute event overnight.  Patient neurologically intact.  Cardiology consulted for frequent PVCs and short runs of V. tach and recommended 30-day CardioNet monitoring as outpatient and possible beta-blocker for PVC treatment.    From neuro standpoint, we recommend aspirin Plavix 3 weeks and then aspirin alone.  LDL 132, started on Crestor 20.  PT/OT no recommendation.   For detailed assessment and plan, please refer to above/below as I have made changes wherever appropriate.  Neurology will sign off. Please call with questions. Pt will follow up with Dr. Krista Blue at [facility] in about 4 weeks. Thanks for the consult.  Rosalin Hawking, MD PhD Stroke Neurology 05/14/2022 7:20 PM     INTERVAL HISTORY Patient is laying in bed in NAD. No family at the bedside. No new neurological events overnight.  LDL 132 started on crestor '20mg'$    Vitals:   05/13/22 1953 05/13/22 2008 05/14/22 0006 05/14/22 1118  BP:  (!) 120/93 116/70 (!) 105/59  Pulse:  87 68 84  Resp:  '18 16 17  '$ Temp:   97.8 F (36.6 C)   TempSrc:      SpO2:  99% 98% 94%  Weight: 69.5 kg     Height: '5\' 4"'$  (1.626 m)      CBC:  Recent Labs  Lab 05/12/22 1838  WBC 5.2  NEUTROABS 3.0  HGB 14.9  HCT 43.8  MCV 93.6  PLT 062    Basic Metabolic Panel:  Recent Labs  Lab 05/12/22 1838 05/13/22 1620 05/14/22 0907  NA 138  --  138  K 4.3  --  3.9  CL 104  --  111  CO2 23  --  18*  GLUCOSE 93  --  91  BUN 22  --  13  CREATININE 1.14*  --  1.18*  CALCIUM 9.9  --  8.7*  MG 2.4 2.4 2.2  PHOS  --   --  3.4    Lipid Panel:  Recent Labs  Lab 05/13/22 1620  CHOL 224*  TRIG 74  HDL 77  CHOLHDL 2.9  VLDL 15  LDLCALC 132*   HgbA1c:  Recent Labs  Lab 05/13/22 1620  HGBA1C 5.2   Urine Drug Screen: No results for input(s): "LABOPIA", "COCAINSCRNUR", "LABBENZ", "AMPHETMU", "THCU", "LABBARB" in the last 168 hours.   Alcohol Level No results for input(s): "ETH" in the last 168 hours.  IMAGING past 24 hours No results found.  PHYSICAL EXAM  Physical Exam  Constitutional: Appears well-developed and well-nourished.  Psych: Affect appropriate to situation, patient is calm and cooperative with exam  Eyes: No scleral injection HENT: No OP obstrucion MSK: no joint deformities.  Cardiovascular: Normal rate and regular rhythm with frequent PVCs Respiratory: Effort normal, non-labored breathing GI: Soft.  No distension. There is no tenderness.  Skin: WDI  Neuro: Mental Status: Patient is awake, alert, oriented to person, place, month, year, and situation. Patient is able to give a clear and coherent history. No signs of aphasia or neglect Cranial Nerves: II: Visual Fields are full. Pupils are equal, round, and reactive to light.   III,IV, VI: EOMI without ptosis or diploplia.  V: Facial sensation is symmetric to light touch  VII: Facial movement is symmetric resting and smiling VIII: Hearing is intact to voice X: Palate elevates symmetrically XI: Shoulder shrug is symmetric. XII: Tongue protrudes midline without atrophy or fasciculations.  Motor:  Tone is normal. Bulk is normal. 5/5 strength was present in all four extremities without vertical drift or asymmetry  Sensory: Sensation is symmetric to light touch and temperature in the arms and legs. No extinction to DSS present.  Cerebellar: FNF and HKS are intact bilaterally  ASSESSMENT/PLAN Dawn Harmon is a 81 y.o. female with history of HLD, chronic fatigue, CKD, depression, osteoporosis presenting with right upper and lower extremity weakness and right foot dragging with ambulation.   Stroke: acute lacunar infarction in the left posterior lentiform/external capsule. Etiology likely small vessel disease.  CT head 11/10: No acute abnormality. Small vessel white matter disease.  CTA head & neck negative for LVO. Generalized arterial  tortuosity but minimal atherosclerosis in the head and neck with no arterial stenosis.  MRI  small, subtle acute lacunar infarction in the left posterior lentiform/external capsule.  2D Echo pending  LDL 132 HgbA1c 5.2 VTE prophylaxis - Lovenox 40 mg SQ     Diet   Diet Heart Room service appropriate? Yes; Fluid consistency: Thin   No antithrombotic prior to admission, now on aspirin 81 mg daily and clopidogrel 75 mg daily for 21 days followed by aspirin monotherapy  Will need outpatient follow up with Neurology in 2 months after discharge Therapy recommendations:None  Disposition:  Pending   Hypertension Home meds:  None Stable Permissive hypertension (OK if < 220/120) but gradually normalize in 5-7 days Long-term BP goal normotensive  Hyperlipidemia Home meds:  None LDL pending, goal < 70 High intensity statin recommendation pending lipid panel completion  Continue statin at discharge  Other Stroke Risk Factors Advanced Age >/= 38   Other Active Problems CKD  Hospital day # 1  Beulah Gandy DNP, ACNPC-AG   To contact Stroke Continuity provider, please refer to [email or web address] After hours, contact General Neurology

## 2022-05-14 NOTE — Progress Notes (Signed)
PT Cancellation Note  Patient Details Name: Dawn Harmon MRN: [id number] DOB: 1940/08/20   Cancelled Treatment:    Reason Eval/Treat Not Completed: Other (comment)  Patient reports she is back to baseline. Reports she has been up walking and does not see need for PT evaluation. She was offered opportunity to complete stair training which she also deferred. PT is signing off.    [place]  Office [phone]  Rexanne Mano 05/14/2022, 9:28 AM

## 2022-05-14 NOTE — Plan of Care (Signed)
  Problem: Education: Goal: Knowledge of disease or condition will improve Outcome: Progressing Goal: Knowledge of secondary prevention will improve (MUST DOCUMENT ALL) Outcome: Progressing Goal: Knowledge of patient specific risk factors will improve [place] N/A or DELETE if not current risk factor) Outcome: Progressing   Problem: Coping: Goal: Will verbalize positive feelings about self Outcome: Progressing Goal: Will identify appropriate support needs Outcome: Progressing

## 2022-05-14 NOTE — Discharge Summary (Signed)
Physician Discharge Summary   Patient: Dawn Harmon MRN: [id number] DOB: December 30, 1940  Admit date:     05/12/2022  Discharge date: 05/14/22  Discharge Physician: Murray Hodgkins   PCP: Harlan Stains, MD   Recommendations at discharge:   Acute lacunar infarction in the left posterior lentiform/external capsule. Etiology likely small vessel disease.  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  Telemetry this admission shows frequent ventricular ectopy with NSVT up to 14 beats.   Important to rule out atrial fibrillation and also quantify her PVCs. Seen by cardiology, plan outpatient monitor and follow-up 1-2 months  Possible sclerotic density in the body of T9 vertebra. Follow-up routine AP and lateral views of thoracic spine may be considered for further characterization.  Discharge Diagnoses: Principal Problem:   CVA (cerebral vascular accident) ([place]) Active Problems:   Frequent PVCs   Chronic kidney disease, stage 3 unspecified ([place])   Hyperlipidemia   Depression   Chronic fatigue syndrome  Resolved Problems:   * No resolved hospital problems. *  Hospital Course: 81 year old woman presented with severe weakness right lower extremity, found to have lacunar stroke, secondary to small vessel disease.  Acute lacunar infarction in the left posterior lentiform/external capsule. Etiology likely small vessel disease.  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  Telemetry this admission shows frequent ventricular ectopy with NSVT up to 14 beats.   Important to rule out atrial fibrillation and also quantify her PVCs. Seen by cardiology, plan utpatient monitor and follow-up 1-2 months   CKD stage IIIa Chronic.  Creatinine 1.14 which  appears around patient's baseline when reviewing prior records.   Hyperlipidemia statin   Depression chronic fatigue syndrome fibromyalgia TSH WNL  Possible sclerotic density in the body of T9 vertebra. Follow-up routine AP and lateral views of thoracic spine may be considered for further characterization.  Consultants:  Neurology  Procedures performed: None   Disposition: Home Diet recommendation:  Regular diet DISCHARGE MEDICATION: Allergies as of 05/14/2022       Reactions   Bupropion Other (See Comments)   Unknown reaction   Doxepin Hcl Other (See Comments)   Unknown reaction   Duloxetine Hcl Other (See Comments)   Unknown reaction   Hydroxyzine Other (See Comments)   Unknown reaction   Levofloxacin Other (See Comments)   insomnia   Mirtazapine Other (See Comments)   Unknown reaction   Nitrofurantoin Other (See Comments)   Unknown reaction   Serotonin Reuptake Inhibitors (ssris) Other (See Comments)   Unknown reaction   Temazepam Other (See Comments)   Unknown reaction   Zolpidem Tartrate Other (See Comments)   Unknown reaction        Medication List     TAKE these medications    ASHWAGANDHA PO Take 1 Capful by mouth daily at 12 noon.   aspirin EC 81 MG tablet Take 1 tablet (81 mg total) by mouth daily. Swallow whole. Start taking on: May 15, 2022   clopidogrel 75 MG tablet Commonly known as: PLAVIX Take 1 tablet (75 mg total) by mouth daily. Start taking on: May 15, 2022   Cranberry 300 MG tablet Take 300 mg by mouth daily.   escitalopram 5 MG tablet Commonly known as: LEXAPRO Take 5  mg by mouth daily.   metoprolol succinate 25 MG 24 hr tablet Commonly known as: TOPROL-XL Take 1 tablet (25 mg total) by mouth daily.   multivitamin tablet Take 1 tablet by mouth daily.   rosuvastatin 20 MG tablet Commonly known as: CRESTOR Take 1 tablet (20 mg total) by mouth daily. Start taking on: May 15, 2022   TURMERIC PO Take  1 capsule by mouth daily.        Follow-up Information     [facility] Follow up.   Why: Office will contact you with an appointment Contact information: [address]     [address] [place] [address] [phone]        Nahser, Wonda Cheng, MD Follow up.   Specialty: Cardiology Why: Office will contact you with an appointment Contact information: [address] [place] [address] [phone]               Subjective:  Feels fine, back to normal Has been told had "extra beats" in the past, but that there was no concern  Discharge Exam: Filed Weights   05/13/22 1953  Weight: 69.5 kg   Physical Exam Vitals reviewed.  Constitutional:      General: She is not in acute distress.    Appearance: She is not ill-appearing or toxic-appearing.  Cardiovascular:     Rate and Rhythm: Normal rate and regular rhythm.     Heart sounds: No murmur heard.    Comments: Telemetry multiple episodes of NSVT or aberrancy   Pulmonary:     Effort: Pulmonary effort is normal. No respiratory distress.     Breath sounds: No wheezing or rales.  Neurological:     General: No focal deficit present.     Mental Status: She is alert.     Cranial Nerves: No cranial nerve deficit.     Motor: No weakness.     Coordination: Coordination normal.  Psychiatric:        Mood and Affect: Mood normal.        Behavior: Behavior normal.   Condition at discharge: good  The results of significant diagnostics from this hospitalization (including imaging, microbiology, ancillary and laboratory) are listed below for reference.   Imaging Studies: ECHOCARDIOGRAM COMPLETE  Result Date: 05/14/2022    ECHOCARDIOGRAM REPORT   Patient Name:   Dawn Harmon Date of Exam: 05/14/2022 Medical Rec #:  [id number]    Height:       64.0 in Accession #:    [id number]   Weight:       153.2 lb Date of Birth:  [birth year]    BSA:          1.747 m Patient Age:    27  years     BP:           116/70 mmHg Patient Gender: F            HR:           83 bpm. Exam Location:  Inpatient Procedure: 2D Echo, Cardiac Doppler and Color Doppler Indications:    Stroke I63.9  History:        Patient has prior history of Echocardiogram examinations, most                 recent 05/19/2014. Stroke; Risk Factors:Dyslipidemia.  Sonographer:    Bernadene Person RDCS Referring Phys: [id number] [place]  1. Left ventricular ejection fraction, by estimation, is 60  to 65%. Left ventricular ejection fraction by 2D MOD biplane is 64.0 %. The left ventricle has normal function. The left ventricle has no regional wall motion abnormalities. Left ventricular diastolic parameters are consistent with Grade I diastolic dysfunction (impaired relaxation).  2. Right ventricular systolic function is normal. The right ventricular size is normal. There is normal pulmonary artery systolic pressure. The estimated right ventricular systolic pressure is 34.1 mmHg.  3. The mitral valve is abnormal. Mild mitral valve regurgitation.  4. The aortic valve is tricuspid. Aortic valve regurgitation is not visualized. Aortic valve sclerosis is present, with no evidence of aortic valve stenosis.  5. The inferior vena cava is dilated in size with >50% respiratory variability, suggesting right atrial pressure of 8 mmHg.  6. Rhythm strip during this exam demonstrates NSVT. Comparison(s): Prior images unable to be directly viewed, comparison made by report only. Changes from prior study are noted. 05/19/2014: LVEF 50-55%. FINDINGS  Left Ventricle: Left ventricular ejection fraction, by estimation, is 60 to 65%. Left ventricular ejection fraction by 2D MOD biplane is 64.0 %. The left ventricle has normal function. The left ventricle has no regional wall motion abnormalities. The left ventricular internal cavity size was normal in size. There is no left ventricular hypertrophy. Left ventricular diastolic parameters are  consistent with Grade I diastolic dysfunction (impaired relaxation). Indeterminate filling pressures. Right Ventricle: The right ventricular size is normal. No increase in right ventricular wall thickness. Right ventricular systolic function is normal. There is normal pulmonary artery systolic pressure. The tricuspid regurgitant velocity is 2.14 m/s, and  with an assumed right atrial pressure of 8 mmHg, the estimated right ventricular systolic pressure is 96.2 mmHg. Left Atrium: Left atrial size was normal in size. Right Atrium: Right atrial size was normal in size. Pericardium: There is no evidence of pericardial effusion. Mitral Valve: The mitral valve is abnormal. There is mild calcification of the anterior and posterior mitral valve leaflet(s). Mild mitral valve regurgitation. Tricuspid Valve: The tricuspid valve is grossly normal. Tricuspid valve regurgitation is trivial. Aortic Valve: The aortic valve is tricuspid. Aortic valve regurgitation is not visualized. Aortic valve sclerosis is present, with no evidence of aortic valve stenosis. Pulmonic Valve: The pulmonic valve was grossly normal. Pulmonic valve regurgitation is trivial. Aorta: The aortic root and ascending aorta are structurally normal, with no evidence of dilitation. Venous: The inferior vena cava is dilated in size with greater than 50% respiratory variability, suggesting right atrial pressure of 8 mmHg. IAS/Shunts: No atrial level shunt detected by color flow Doppler. EKG: Rhythm strip during this exam demonstrates NSVT.  LEFT VENTRICLE PLAX 2D                        Biplane EF (MOD) LVIDd:         4.50 cm         LV Biplane EF:   Left LVIDs:         3.10 cm                          ventricular LV PW:         0.80 cm                          ejection LV IVS:        0.70 cm  fraction by LVOT diam:     1.90 cm                          2D MOD LV SV:         41                               biplane is LV SV Index:   23                                64.0 %. LVOT Area:     2.84 cm  LV Volumes (MOD) LV vol d, MOD    74.8 ml A2C: LV vol d, MOD    73.1 ml A4C: LV vol s, MOD    25.5 ml A2C: LV vol s, MOD    25.7 ml A4C: LV SV MOD A2C:   49.3 ml LV SV MOD A4C:   73.1 ml LV SV MOD BP:    48.1 ml RIGHT VENTRICLE TAPSE (M-mode): 2.3 cm LEFT ATRIUM             Index        RIGHT ATRIUM           Index LA diam:        2.90 cm 1.66 cm/m   RA Area:     16.20 cm LA Vol (A2C):   31.2 ml 17.86 ml/m  RA Volume:   41.70 ml  23.87 ml/m LA Vol (A4C):   27.9 ml 15.97 ml/m LA Biplane Vol: 31.7 ml 18.15 ml/m  AORTIC VALVE LVOT Vmax:   81.53 cm/s LVOT Vmean:  52.567 cm/s LVOT VTI:    0.145 m  AORTA Ao Root diam: 3.20 cm Ao Asc diam:  3.20 cm TRICUSPID VALVE TR Peak grad:   18.3 mmHg TR Vmax:        214.00 cm/s  SHUNTS Systemic VTI:  0.14 m Systemic Diam: 1.90 cm Lyman Bishop MD Electronically signed by Lyman Bishop MD Signature Date/Time: 05/14/2022/2:10:27 PM    Final    CT ANGIO HEAD NECK W WO CM  Result Date: 05/13/2022 CLINICAL DATA:  81 year old female with neurologic deficit and MRI reveals a small left lentiform/posterior limb external capsule lacunar infarct. EXAM: CT ANGIOGRAPHY HEAD AND NECK TECHNIQUE: Multidetector CT imaging of the head and neck was performed using the standard protocol during bolus administration of intravenous contrast. Multiplanar CT image reconstructions and MIPs were obtained to evaluate the vascular anatomy. Carotid stenosis measurements (when applicable) are obtained utilizing NASCET criteria, using the distal internal carotid diameter as the denominator. RADIATION DOSE REDUCTION: This exam was performed according to the departmental dose-optimization program which includes automated exposure control, adjustment of the mA and/or kV according to patient size and/or use of iterative reconstruction technique. CONTRAST:  70m OMNIPAQUE IOHEXOL 350 MG/ML SOLN COMPARISON:  Brain MRI earlier today. Head CTs patch that  head and cervical spine CT yesterday. FINDINGS: CT HEAD Brain: The left-sided lacunar infarct remains occult by CT. Stable non contrast CT appearance of the brain. No midline shift, mass effect, or evidence of intracranial mass lesion. No acute intracranial hemorrhage identified. Calvarium and skull base: Stable. Paranasal sinuses: Visualized paranasal sinuses and mastoids are stable and well aerated. Orbits: No acute orbit or scalp soft tissue finding. CTA NECK Skeleton: TMJ and cervical spine degeneration. Spondylolisthesis  at the cervicothoracic junction appears to be degenerative, stable. No acute osseous abnormality identified. Upper chest: Negative. Other neck: Negative. Aortic arch: 3 vessel arch configuration. Calcified arch atherosclerosis. Right carotid system: Mild to moderate tortuosity but minimal atherosclerosis. No stenosis. Left carotid system: Similar tortuosity and mild atherosclerotic plaque with no stenosis. Vertebral arteries: Diminutive cervical vertebral arteries. Mild proximal right subclavian artery plaque without stenosis. Normal right vertebral artery origin. Proximal left subclavian artery is patent with no visible plaque or stenosis. Normal left vertebral artery origin. Fairly codominant diminutive vertebral arteries are patent to the skull base with tortuosity but no plaque or stenosis identified. CTA HEAD Posterior circulation: Distal vertebral arteries are both diminutive, especially the left beyond the PICA origin. Both PICA origins are patent. Patent although diminutive vertebrobasilar junction and basilar artery with fetal type bilateral PCA origins. SCA origins are patent. Bilateral PCA branches are within normal limits. Anterior circulation: Both ICA siphons are patent. Bilateral siphon tortuosity with only mild calcified plaque and no siphon stenosis. Both posterior communicating artery origins are normal. Patent carotid termini, MCA and ACA origins. Dominant left and diminutive  or absent right ACA A1 segments. Normal left ACA origin. Normal anterior communicating artery and bilateral ACA branches. Bilateral MCA M1 segments and bi/trifurcations are patent without stenosis. Bilateral MCA branches are within normal limits. Venous sinuses: Early contrast timing, not evaluated. Anatomic variants: Diminutive vertebrobasilar system on the basis of fetal type bilateral PCA origins. Dominant left and diminutive or absent right ACA A1 segment. Review of the MIP images confirms the above findings IMPRESSION: 1. Negative for large vessel occlusion. Generalized arterial tortuosity but minimal atherosclerosis in the head and neck with no arterial stenosis. 2. Left lentiform lacunar infarct remains occult by CT. No new intracranial abnormality. 3. TMJ and cervical spine degeneration. Electronically Signed   By: Genevie Ann M.D.   On: 05/13/2022 11:27   MR BRAIN WO CONTRAST  Result Date: 05/13/2022 CLINICAL DATA:  81 year old female with right side weakness. Neurologic deficit. EXAM: MRI HEAD WITHOUT CONTRAST TECHNIQUE: Multiplanar, multiecho pulse sequences of the brain and surrounding structures were obtained without intravenous contrast. COMPARISON:  Head CT 05/12/2022.  Brain MRI 01/12/2017. FINDINGS: Brain: Cerebral volume has not significantly changed since 2018 and appears normal for age. Small and relatively subtle nodular, curvilinear restricted diffusion in the left posterior lentiform at the junction with the posterior limb external capsule (series 5 images 75 through 72). Faint if any T2 and FLAIR hyperintensity. No hemorrhage or mass effect. No other No restricted diffusion to suggest acute infarction. No midline shift, mass effect, evidence of mass lesion, ventriculomegaly, extra-axial collection or acute intracranial hemorrhage. Cervicomedullary junction and pituitary are within normal limits. Background gray and white matter signal has not significantly changed from 2018 and remains  largely normal for age. No cortical encephalomalacia or chronic cerebral blood products identified. Minor T2 heterogeneity in the right pons. Vascular: Major intracranial vascular flow voids are stable since 2018. Skull and upper cervical spine: Normal for age visible cervical spine. Visualized bone marrow signal is within normal limits. Sinuses/Orbits: Postoperative changes to both globes since 2018. Paranasal sinuses and mastoids are stable and well aerated. Other: Grossly normal visible internal auditory structures. Negative visible scalp and face. IMPRESSION: 1. Positive for a small, subtle acute lacunar infarct in the Left posterior lentiform/external capsule. No associated hemorrhage or mass effect. 2. Otherwise normal for age noncontrast MRI appearance of the brain. Electronically Signed   By: Genevie Ann M.D.   On:  05/13/2022 04:11   CT Cervical Spine Wo Contrast  Result Date: 05/12/2022 CLINICAL DATA:  Right arm weakness neuro deficit, acute stroke suspected EXAM: CT HEAD WITHOUT CONTRAST CT CERVICAL SPINE WITHOUT CONTRAST TECHNIQUE: Multidetector CT imaging of the head and cervical spine was performed following the standard protocol without intravenous contrast. Multiplanar CT image reconstructions of the cervical spine were also generated. RADIATION DOSE REDUCTION: This exam was performed according to the departmental dose-optimization program which includes automated exposure control, adjustment of the mA and/or kV according to patient size and/or use of iterative reconstruction technique. COMPARISON:  None Available. FINDINGS: CT HEAD FINDINGS Brain: No evidence of acute infarction, hemorrhage, hydrocephalus, extra-axial collection or mass lesion/mass effect. Mild periventricular white matter hypodensity. Vascular: No hyperdense vessel or unexpected calcification. Skull: Normal. Negative for fracture or focal lesion. Sinuses/Orbits: No acute finding. Other: None. CT CERVICAL SPINE FINDINGS Alignment:  Degenerative straightening of the normal cervical lordosis. Skull base and vertebrae: No acute fracture. No primary bone lesion or focal pathologic process. Soft tissues and spinal canal: No prevertebral fluid or swelling. No visible canal hematoma. Disc levels: Focally severe disc space height loss and osteophytosis of C5-C6 with otherwise mild multilevel disc degenerative disease. Upper chest: Negative. Other: None. IMPRESSION: 1. No acute intracranial pathology. Small-vessel white matter disease. 2. No fracture or static subluxation of the cervical spine. 3. Focally severe disc space height loss and osteophytosis of C5-C6 with otherwise mild multilevel disc degenerative disease. Electronically Signed   By: Delanna Ahmadi M.D.   On: 05/12/2022 19:05   CT Head Wo Contrast  Result Date: 05/12/2022 CLINICAL DATA:  Right arm weakness neuro deficit, acute stroke suspected EXAM: CT HEAD WITHOUT CONTRAST CT CERVICAL SPINE WITHOUT CONTRAST TECHNIQUE: Multidetector CT imaging of the head and cervical spine was performed following the standard protocol without intravenous contrast. Multiplanar CT image reconstructions of the cervical spine were also generated. RADIATION DOSE REDUCTION: This exam was performed according to the departmental dose-optimization program which includes automated exposure control, adjustment of the mA and/or kV according to patient size and/or use of iterative reconstruction technique. COMPARISON:  None Available. FINDINGS: CT HEAD FINDINGS Brain: No evidence of acute infarction, hemorrhage, hydrocephalus, extra-axial collection or mass lesion/mass effect. Mild periventricular white matter hypodensity. Vascular: No hyperdense vessel or unexpected calcification. Skull: Normal. Negative for fracture or focal lesion. Sinuses/Orbits: No acute finding. Other: None. CT CERVICAL SPINE FINDINGS Alignment: Degenerative straightening of the normal cervical lordosis. Skull base and vertebrae: No acute  fracture. No primary bone lesion or focal pathologic process. Soft tissues and spinal canal: No prevertebral fluid or swelling. No visible canal hematoma. Disc levels: Focally severe disc space height loss and osteophytosis of C5-C6 with otherwise mild multilevel disc degenerative disease. Upper chest: Negative. Other: None. IMPRESSION: 1. No acute intracranial pathology. Small-vessel white matter disease. 2. No fracture or static subluxation of the cervical spine. 3. Focally severe disc space height loss and osteophytosis of C5-C6 with otherwise mild multilevel disc degenerative disease. Electronically Signed   By: Delanna Ahmadi M.D.   On: 05/12/2022 19:05   DG Chest Portable 1 View  Result Date: 05/12/2022 CLINICAL DATA:  Weakness in right upper extremity EXAM: PORTABLE CHEST 1 VIEW COMPARISON:  03/07/2012 FINDINGS: Cardiac size is within normal limits. There are no signs of pulmonary edema. There are new transverse linear densities in both lower lung fields. There is no pleural effusion or pneumothorax. There is a possible new sclerotic density in the body of T9 vertebra. This finding is  not adequately evaluated without the lateral view. Degenerative changes are noted in right shoulder. IMPRESSION: There are new transverse linear densities in both lower lung fields suggesting scarring or subsegmental atelectasis. There is no focal pulmonary consolidation. There are no signs of pulmonary edema. There is no pleural effusion or pneumothorax. There is possible sclerotic density in the body of T9 vertebra. Follow-up routine AP and lateral views of thoracic spine may be considered for further characterization. Degenerative changes are noted in right shoulder. Electronically Signed   By: Elmer Picker M.D.   On: 05/12/2022 19:00    Microbiology: Results for orders placed or performed in visit on 08/14/14  Urine culture     Status: None   Collection Time: 08/14/14 11:12 AM   Specimen: Urine  Result  Value Ref Range Status   Culture ESCHERICHIA COLI  Final   Colony Count >=100,000 COLONIES/ML  Final   Organism ID, Bacteria ESCHERICHIA COLI  Final      Susceptibility   Escherichia coli -  (no method available)    AMPICILLIN >=32 Resistant     AMOX/CLAVULANIC >=32 Resistant     AMPICILLIN/SULBACTAM >=32 Resistant     PIP/TAZO 64 Intermediate     IMIPENEM <=0.25 Sensitive     CEFAZOLIN 32 Resistant     CEFTRIAXONE <=1 Sensitive     CEFTAZIDIME <=1 Sensitive     CEFEPIME <=1 Sensitive     GENTAMICIN <=1 Sensitive     TOBRAMYCIN <=1 Sensitive     CIPROFLOXACIN <=0.25 Sensitive     LEVOFLOXACIN 1 Sensitive     NITROFURANTOIN <=16 Sensitive     TRIMETH/SULFA >=320 Resistant     Labs: CBC: Recent Labs  Lab 05/12/22 1838  WBC 5.2  NEUTROABS 3.0  HGB 14.9  HCT 43.8  MCV 93.6  PLT 469   Basic Metabolic Panel: Recent Labs  Lab 05/12/22 1838 05/13/22 1620 05/14/22 0907  NA 138  --  138  K 4.3  --  3.9  CL 104  --  111  CO2 23  --  18*  GLUCOSE 93  --  91  BUN 22  --  13  CREATININE 1.14*  --  1.18*  CALCIUM 9.9  --  8.7*  MG 2.4 2.4 2.2  PHOS  --   --  3.4   Liver Function Tests: No results for input(s): "AST", "ALT", "ALKPHOS", "BILITOT", "PROT", "ALBUMIN" in the last 168 hours. CBG: No results for input(s): "GLUCAP" in the last 168 hours.  Discharge time spent: greater than 30 minutes.  Signed: Murray Hodgkins, MD [facility] 05/14/2022

## 2022-05-14 NOTE — Plan of Care (Signed)

## 2022-05-14 NOTE — Consult Note (Addendum)
Cardiology Consultation   Patient ID: Dawn Harmon MRN: [id number]; DOB: 1940/10/23  Admit date: 05/12/2022 Date of Consult: 05/14/2022  PCP:  Harlan Stains, MD   [facility] Cardiologist:  [place]    Patient Profile:   Dawn Harmon is a 81 y.o. female with a hx of fibromyalgia, chronic fatigue syndrome, depression, PVCs, HLD, CKD stage IIIa, and current admission for acute CVA who is being seen 05/14/2022 for the evaluation of NSVT at the request of Dr. Sarajane Jews.  History of Present Illness:   Dawn Harmon is followed by Dr. Sallyanne Kuster.  Evaluation for arrhythmia performed in 2015 showed frequent premature ventricular contractions, ventricular couplets and occasional brief runs of nonsustained VT (maximum 5 beats).  At that same time she underwent an echocardiogram and nuclear stress test which both showed benign findings.  LVEF was 50-55% on the echo, without evidence of significant LVH or valvular abnormalities.  There is mention of diastolic dysfunction but diastolic parameters are probably normal for her age. ECG treadmill stress testing did not show any ST segment changes, but did show frequent PVCs and occasional ventricular couplets.   She was last seen in our office in 2022 by Dr. Sallyanne Kuster who felt like she was stable from a cardiac standpoint.  He did note low voltage on her EKG which raised the concern for possible amyloidosis but he did not see any other features that would support this diagnosis and deferred further work-up.  Additionally, he did note that there were relatively frequent PVCs but that she was not aware of the ectopy during her physical exam or during acquisition of her ECG and did not think she would tolerate a beta-blocker given her chronic fatigue.  She presented to [facility] on 05/13/22 with acute right leg weakness. CT scan of the head and neck noted no acute abnormality. Patient was out of the window for thrombolytics. MRI revealed small  subtle acute lacunar infarct in the left posterior lenticular form/external capsule.  Patient was loaded with aspirin and Plavix. The patient was noted to have NSVT and cardiology consulted.   She is seen lying in bed alone.  She denies any current complaints. No CP or SOB. No LE edema, orthopnea or PND. No dizziness or syncope. No blood in stool or urine. No palpitations. \She has chronic fatigue which is chronic for her.  Her right-sided weakness has completely resolved.  She is very anxious to go home.   Past Medical History:  Diagnosis Date   Chronic fatigue fibromyalgia syndrome    CKD (chronic kidney disease)    Depression    Insomnia    Osteoarthritis    neck   Osteoporosis     Past Surgical History:  Procedure Laterality Date   BUNIONECTOMY     HERNIA REPAIR     TONSILLECTOMY       Home Medications:  Prior to Admission medications   Medication Sig Start Date End Date Taking? Authorizing Provider  ASHWAGANDHA PO Take 1 Capful by mouth daily at 12 noon.   Yes [provider]  Cranberry 300 MG tablet Take 300 mg by mouth daily.   Yes [provider]  escitalopram (LEXAPRO) 5 MG tablet Take 5 mg by mouth daily. 03/13/22  Yes [provider]  Multiple Vitamin (MULTIVITAMIN) tablet Take 1 tablet by mouth daily.   Yes [provider]  TURMERIC PO Take 1 capsule by mouth daily.   Yes [provider]    Inpatient Medications: Scheduled  Meds:  aspirin EC  81 mg Oral Daily   clopidogrel  75 mg Oral Daily   enoxaparin (LOVENOX) injection  40 mg Subcutaneous Q24H   escitalopram  5 mg Oral Daily   multivitamin with minerals  1 tablet Oral Daily   rosuvastatin  20 mg Oral Daily   Continuous Infusions:  sodium chloride 75 mL/hr at 05/14/22 0406   PRN Meds:   Allergies:    Allergies  Allergen Reactions   Bupropion Other (See Comments)    Unknown reaction   Doxepin Hcl Other (See Comments)    Unknown reaction    Duloxetine Hcl  Other (See Comments)    Unknown reaction    Hydroxyzine Other (See Comments)    Unknown reaction    Levofloxacin Other (See Comments)    insomnia   Mirtazapine Other (See Comments)    Unknown reaction    Nitrofurantoin Other (See Comments)    Unknown reaction    Serotonin Reuptake Inhibitors (Ssris) Other (See Comments)    Unknown reaction    Temazepam Other (See Comments)    Unknown reaction    Zolpidem Tartrate Other (See Comments)    Unknown reaction     Social History:   Social History   Socioeconomic History   Marital status: Married    Spouse name: Not on file   Number of children: 2   Years of education: HS   Highest education level: Not on file  Occupational History   Occupation: Retired  Tobacco Use   Smoking status: Never   Smokeless tobacco: Never  Vaping Use   Vaping Use: Never used  Substance and Sexual Activity   Alcohol use: Yes    Comment: May have one glass of wine   Drug use: No   Sexual activity: Not on file  Other Topics Concern   Not on file  Social History Narrative   Lives at home with her husband.   Right-handed.   1-2 cups caffeine per day.   Social Determinants of Health   Financial Resource Strain: Not on file  Food Insecurity: Not on file  Transportation Needs: Not on file  Physical Activity: Not on file  Stress: Not on file  Social Connections: Not on file  Intimate Partner Violence: Not on file    Family History:    Family History  Problem Relation Age of Onset   Breast cancer Mother    Suicidality Father    Arthritis Father    Depression Father    Breast cancer Sister      ROS:  Please see the history of present illness.  All other ROS reviewed and negative.     Physical Exam/Data:   Vitals:   05/13/22 1953 05/13/22 2008 05/14/22 0006 05/14/22 1118  BP:  (!) 120/93 116/70 (!) 105/59  Pulse:  87 68 84  Resp:  '18 16 17  '$ Temp:   97.8 F (36.6 C)   TempSrc:      SpO2:  99% 98% 94%  Weight: 69.5 kg      Height: '5\' 4"'$  (1.626 m)       Intake/Output Summary (Last 24 hours) at 05/14/2022 1226 Last data filed at 05/14/2022 1100 Gross per 24 hour  Intake 1519.7 ml  Output --  Net 1519.7 ml      05/13/2022    7:53 PM 02/02/2021    1:27 PM 02/19/2017   11:24 AM  Last 3 Weights  Weight (lbs) 153 lb 3.5 oz 151 lb 146 lb  Weight (kg) 69.5 kg 68.493 kg 66.225 kg     Body mass index is 26.3 kg/m.  General:  Well nourished, well developed, in no acute distress HEENT: normal Neck: no JVD Vascular: No carotid bruits; Distal pulses 2+ bilaterally Cardiac:  normal S1, S2; RRR; no murmur  Lungs:  clear to auscultation bilaterally, no wheezing, rhonchi or rales  Abd: soft, nontender, no hepatomegaly  Ext: no edema Musculoskeletal:  No deformities, BUE and BLE strength normal and equal Skin: warm and dry  Neuro:  CNs 2-12 intact, no focal abnormalities noted Psych:  Normal affect   EKG:  The EKG was personally reviewed and demonstrates:  sinus with low voltage qrs, frequent PVCs, LAD. HR 71 Telemetry:  Telemetry was personally reviewed and demonstrates:   Sinus with frequent PVCs and NSVT ranging from 4-5 beats all the way up to 14 beats.  No tachycardia.  Relevant CV Studies: ECG treadmill stress test 2015  Frequent PVC's and couplets with exercise  No diagnostic ST segment changes concerning for ischemia  Fair exercise tolerance  No chest pain    Echocardiogram 2015  - Left ventricle: The cavity size was normal. Systolic function was    normal. The estimated ejection fraction was in the range of 50%    to 55%. Wall motion was normal; there were no regional wall    motion abnormalities. Doppler parameters are consistent with    abnormal left ventricular relaxation (grade 1 diastolic    dysfunction). There was no evidence of elevated ventricular    filling pressure by Doppler parameters.  - Aortic valve: Trileaflet; normal thickness leaflets. There was    trivial regurgitation.  -  Aortic root: The aortic root was normal in size.  - Mitral valve: Calcified annulus. Mildly thickened leaflets .    There was trivial regurgitation.  - Right ventricle: The cavity size was normal. Wall thickness was    normal. Systolic function was normal.  - Right atrium: The atrium was normal in size.  - Tricuspid valve: There was mild regurgitation.  - Pulmonary arteries: Systolic pressure was within the normal    range.  - Inferior vena cava: The vessel was normal in size. The    respirophasic diameter changes were in the normal range (= 50%),    consistent with normal central venous pressure.  - Pericardium, extracardiac: There was no pericardial effusion.      Laboratory Data:  High Sensitivity Troponin:   Recent Labs  Lab 05/12/22 1838  TROPONINIHS 4     Chemistry Recent Labs  Lab 05/12/22 1838 05/13/22 1620 05/14/22 0907  NA 138  --  138  K 4.3  --  3.9  CL 104  --  111  CO2 23  --  18*  GLUCOSE 93  --  91  BUN 22  --  13  CREATININE 1.14*  --  1.18*  CALCIUM 9.9  --  8.7*  MG 2.4 2.4 2.2  GFRNONAA 48*  --  46*  ANIONGAP 11  --  9    No results for input(s): "PROT", "ALBUMIN", "AST", "ALT", "ALKPHOS", "BILITOT" in the last 168 hours. Lipids  Recent Labs  Lab 05/13/22 1620  CHOL 224*  TRIG 74  HDL 77  LDLCALC 132*  CHOLHDL 2.9    Hematology Recent Labs  Lab 05/12/22 1838  WBC 5.2  RBC 4.68  HGB 14.9  HCT 43.8  MCV 93.6  MCH 31.8  MCHC 34.0  RDW 13.8  PLT 339  Thyroid  Recent Labs  Lab 05/13/22 1620  TSH 1.741    BNPNo results for input(s): "BNP", "PROBNP" in the last 168 hours.  DDimer No results for input(s): "DDIMER" in the last 168 hours.   Radiology/Studies:  CT ANGIO HEAD NECK W WO CM  Result Date: 05/13/2022 CLINICAL DATA:  81 year old female with neurologic deficit and MRI reveals a small left lentiform/posterior limb external capsule lacunar infarct. EXAM: CT ANGIOGRAPHY HEAD AND NECK TECHNIQUE: Multidetector CT imaging  of the head and neck was performed using the standard protocol during bolus administration of intravenous contrast. Multiplanar CT image reconstructions and MIPs were obtained to evaluate the vascular anatomy. Carotid stenosis measurements (when applicable) are obtained utilizing NASCET criteria, using the distal internal carotid diameter as the denominator. RADIATION DOSE REDUCTION: This exam was performed according to the departmental dose-optimization program which includes automated exposure control, adjustment of the mA and/or kV according to patient size and/or use of iterative reconstruction technique. CONTRAST:  71m OMNIPAQUE IOHEXOL 350 MG/ML SOLN COMPARISON:  Brain MRI earlier today. Head CTs patch that head and cervical spine CT yesterday. FINDINGS: CT HEAD Brain: The left-sided lacunar infarct remains occult by CT. Stable non contrast CT appearance of the brain. No midline shift, mass effect, or evidence of intracranial mass lesion. No acute intracranial hemorrhage identified. Calvarium and skull base: Stable. Paranasal sinuses: Visualized paranasal sinuses and mastoids are stable and well aerated. Orbits: No acute orbit or scalp soft tissue finding. CTA NECK Skeleton: TMJ and cervical spine degeneration. Spondylolisthesis at the cervicothoracic junction appears to be degenerative, stable. No acute osseous abnormality identified. Upper chest: Negative. Other neck: Negative. Aortic arch: 3 vessel arch configuration. Calcified arch atherosclerosis. Right carotid system: Mild to moderate tortuosity but minimal atherosclerosis. No stenosis. Left carotid system: Similar tortuosity and mild atherosclerotic plaque with no stenosis. Vertebral arteries: Diminutive cervical vertebral arteries. Mild proximal right subclavian artery plaque without stenosis. Normal right vertebral artery origin. Proximal left subclavian artery is patent with no visible plaque or stenosis. Normal left vertebral artery origin. Fairly  codominant diminutive vertebral arteries are patent to the skull base with tortuosity but no plaque or stenosis identified. CTA HEAD Posterior circulation: Distal vertebral arteries are both diminutive, especially the left beyond the PICA origin. Both PICA origins are patent. Patent although diminutive vertebrobasilar junction and basilar artery with fetal type bilateral PCA origins. SCA origins are patent. Bilateral PCA branches are within normal limits. Anterior circulation: Both ICA siphons are patent. Bilateral siphon tortuosity with only mild calcified plaque and no siphon stenosis. Both posterior communicating artery origins are normal. Patent carotid termini, MCA and ACA origins. Dominant left and diminutive or absent right ACA A1 segments. Normal left ACA origin. Normal anterior communicating artery and bilateral ACA branches. Bilateral MCA M1 segments and bi/trifurcations are patent without stenosis. Bilateral MCA branches are within normal limits. Venous sinuses: Early contrast timing, not evaluated. Anatomic variants: Diminutive vertebrobasilar system on the basis of fetal type bilateral PCA origins. Dominant left and diminutive or absent right ACA A1 segment. Review of the MIP images confirms the above findings IMPRESSION: 1. Negative for large vessel occlusion. Generalized arterial tortuosity but minimal atherosclerosis in the head and neck with no arterial stenosis. 2. Left lentiform lacunar infarct remains occult by CT. No new intracranial abnormality. 3. TMJ and cervical spine degeneration. Electronically Signed   By: HGenevie AnnM.D.   On: 05/13/2022 11:27   MR BRAIN WO CONTRAST  Result Date: 05/13/2022 CLINICAL DATA:  81year old female with  right side weakness. Neurologic deficit. EXAM: MRI HEAD WITHOUT CONTRAST TECHNIQUE: Multiplanar, multiecho pulse sequences of the brain and surrounding structures were obtained without intravenous contrast. COMPARISON:  Head CT 05/12/2022.  Brain MRI  01/12/2017. FINDINGS: Brain: Cerebral volume has not significantly changed since 2018 and appears normal for age. Small and relatively subtle nodular, curvilinear restricted diffusion in the left posterior lentiform at the junction with the posterior limb external capsule (series 5 images 75 through 72). Faint if any T2 and FLAIR hyperintensity. No hemorrhage or mass effect. No other No restricted diffusion to suggest acute infarction. No midline shift, mass effect, evidence of mass lesion, ventriculomegaly, extra-axial collection or acute intracranial hemorrhage. Cervicomedullary junction and pituitary are within normal limits. Background gray and white matter signal has not significantly changed from 2018 and remains largely normal for age. No cortical encephalomalacia or chronic cerebral blood products identified. Minor T2 heterogeneity in the right pons. Vascular: Major intracranial vascular flow voids are stable since 2018. Skull and upper cervical spine: Normal for age visible cervical spine. Visualized bone marrow signal is within normal limits. Sinuses/Orbits: Postoperative changes to both globes since 2018. Paranasal sinuses and mastoids are stable and well aerated. Other: Grossly normal visible internal auditory structures. Negative visible scalp and face. IMPRESSION: 1. Positive for a small, subtle acute lacunar infarct in the Left posterior lentiform/external capsule. No associated hemorrhage or mass effect. 2. Otherwise normal for age noncontrast MRI appearance of the brain. Electronically Signed   By: Genevie Ann M.D.   On: 05/13/2022 04:11   CT Cervical Spine Wo Contrast  Result Date: 05/12/2022 CLINICAL DATA:  Right arm weakness neuro deficit, acute stroke suspected EXAM: CT HEAD WITHOUT CONTRAST CT CERVICAL SPINE WITHOUT CONTRAST TECHNIQUE: Multidetector CT imaging of the head and cervical spine was performed following the standard protocol without intravenous contrast. Multiplanar CT image  reconstructions of the cervical spine were also generated. RADIATION DOSE REDUCTION: This exam was performed according to the departmental dose-optimization program which includes automated exposure control, adjustment of the mA and/or kV according to patient size and/or use of iterative reconstruction technique. COMPARISON:  None Available. FINDINGS: CT HEAD FINDINGS Brain: No evidence of acute infarction, hemorrhage, hydrocephalus, extra-axial collection or mass lesion/mass effect. Mild periventricular white matter hypodensity. Vascular: No hyperdense vessel or unexpected calcification. Skull: Normal. Negative for fracture or focal lesion. Sinuses/Orbits: No acute finding. Other: None. CT CERVICAL SPINE FINDINGS Alignment: Degenerative straightening of the normal cervical lordosis. Skull base and vertebrae: No acute fracture. No primary bone lesion or focal pathologic process. Soft tissues and spinal canal: No prevertebral fluid or swelling. No visible canal hematoma. Disc levels: Focally severe disc space height loss and osteophytosis of C5-C6 with otherwise mild multilevel disc degenerative disease. Upper chest: Negative. Other: None. IMPRESSION: 1. No acute intracranial pathology. Small-vessel white matter disease. 2. No fracture or static subluxation of the cervical spine. 3. Focally severe disc space height loss and osteophytosis of C5-C6 with otherwise mild multilevel disc degenerative disease. Electronically Signed   By: Delanna Ahmadi M.D.   On: 05/12/2022 19:05   CT Head Wo Contrast  Result Date: 05/12/2022 CLINICAL DATA:  Right arm weakness neuro deficit, acute stroke suspected EXAM: CT HEAD WITHOUT CONTRAST CT CERVICAL SPINE WITHOUT CONTRAST TECHNIQUE: Multidetector CT imaging of the head and cervical spine was performed following the standard protocol without intravenous contrast. Multiplanar CT image reconstructions of the cervical spine were also generated. RADIATION DOSE REDUCTION: This exam was  performed according to the departmental dose-optimization program  which includes automated exposure control, adjustment of the mA and/or kV according to patient size and/or use of iterative reconstruction technique. COMPARISON:  None Available. FINDINGS: CT HEAD FINDINGS Brain: No evidence of acute infarction, hemorrhage, hydrocephalus, extra-axial collection or mass lesion/mass effect. Mild periventricular white matter hypodensity. Vascular: No hyperdense vessel or unexpected calcification. Skull: Normal. Negative for fracture or focal lesion. Sinuses/Orbits: No acute finding. Other: None. CT CERVICAL SPINE FINDINGS Alignment: Degenerative straightening of the normal cervical lordosis. Skull base and vertebrae: No acute fracture. No primary bone lesion or focal pathologic process. Soft tissues and spinal canal: No prevertebral fluid or swelling. No visible canal hematoma. Disc levels: Focally severe disc space height loss and osteophytosis of C5-C6 with otherwise mild multilevel disc degenerative disease. Upper chest: Negative. Other: None. IMPRESSION: 1. No acute intracranial pathology. Small-vessel white matter disease. 2. No fracture or static subluxation of the cervical spine. 3. Focally severe disc space height loss and osteophytosis of C5-C6 with otherwise mild multilevel disc degenerative disease. Electronically Signed   By: Delanna Ahmadi M.D.   On: 05/12/2022 19:05   DG Chest Portable 1 View  Result Date: 05/12/2022 CLINICAL DATA:  Weakness in right upper extremity EXAM: PORTABLE CHEST 1 VIEW COMPARISON:  03/07/2012 FINDINGS: Cardiac size is within normal limits. There are no signs of pulmonary edema. There are new transverse linear densities in both lower lung fields. There is no pleural effusion or pneumothorax. There is a possible new sclerotic density in the body of T9 vertebra. This finding is not adequately evaluated without the lateral view. Degenerative changes are noted in right shoulder.  IMPRESSION: There are new transverse linear densities in both lower lung fields suggesting scarring or subsegmental atelectasis. There is no focal pulmonary consolidation. There are no signs of pulmonary edema. There is no pleural effusion or pneumothorax. There is possible sclerotic density in the body of T9 vertebra. Follow-up routine AP and lateral views of thoracic spine may be considered for further characterization. Degenerative changes are noted in right shoulder. Electronically Signed   By: Elmer Picker M.D.   On: 05/12/2022 19:00     Assessment and Plan:   NSVT: Patient has a long history of PVCs and NSVT.  Telemetry this admission shows frequent ventricular ectopy with NSVT up to 14 beats.  Magnesium and potassium are within normal limits.  Echo repeated this admission and upon my preliminary review her ejection fraction looks normal with mild to moderate mitral regurgitation.  Given acute CVA this admission, there are plans for an outpatient 30-day monitor.  I think this will be important to rule out atrial fibrillation and also quantify her PVCs.  There has been reluctance in the past to consider a beta-blocker given her paucity of symptoms and issues with chronic fatigue.  I would anticipate that she will be able to be discharged today with an outpatient monitor and follow-up.    Risk Assessment/Risk Scores:            [facility] will sign off.   Medication Recommendations:  no changes Other recommendations (labs, testing, etc):  outpatient monitor Follow up as an outpatient:  1-2 months  For questions or updates, please contact [place] Please consult [email or web address] for contact info under    Signed, Angelena Form, PA-C  05/14/2022 12:26 PM   Attending Note:   The patient was seen and examined.  Agree with assessment and plan as noted above.  Changes made to the above note as needed.  Patient  seen and independently examined with Nell Range, PA .   We discussed all aspects of the encounter. I agree with the assessment and plan as stated above.    NSVT:  has lots of salvos of NSVT.  8-12 beats at the longest generally ,  frequent isolated PVCs  I think she would benefit from a trial of Toprol XL 25 mg a day  She is anxious about possible side effects.  But I think there is a good chance she will actually feel better on the low dose toprol   2.   CVA:  on ASA and plavix She does not sleep well.  She needs a monitor as OP to evaluate for atrial fib. Would prefer a live monitor ( 14 day or 30 day monitor )  Attention to Dr. Sallyanne Kuster   If we do not find evidence of Afib, we can consider an implantable loop recorder .      I have spent a total of 40 minutes with patient reviewing hospital  notes , telemetry, EKGs, labs and examining patient as well as establishing an assessment and plan that was discussed with the patient.  > 50% of time was spent in direct patient care.    Thayer Headings, Brooke Bonito., MD, [facility] 05/14/2022, 1:10 PM [address],  [place] Pager [phone]

## 2022-05-16 ENCOUNTER — Telehealth: Payer: Self-pay | Admitting: Cardiovascular Disease

## 2022-05-16 ENCOUNTER — Other Ambulatory Visit: Payer: Self-pay | Admitting: Cardiovascular Disease

## 2022-05-16 ENCOUNTER — Other Ambulatory Visit (INDEPENDENT_AMBULATORY_CARE_PROVIDER_SITE_OTHER): Payer: Medicare HMO

## 2022-05-16 DIAGNOSIS — I493 Ventricular premature depolarization: Secondary | ICD-10-CM

## 2022-05-16 DIAGNOSIS — I639 Cerebral infarction, unspecified: Secondary | ICD-10-CM

## 2022-05-16 NOTE — Telephone Encounter (Signed)
Spoke with pt regarding recent hospitalization with a CVA. Pt was told when she was discharged on Sunday that she would need a monitor. Pt was told that this could be ordered on Monday at the hospital but pt wanted to be discharged home and do this on an outpatient basis. Pt is calling to inquire about having monitor placed. Pt states that it was mentioned to her that monitor needs to be live. Suggested that we see pt in office for a visit to follow up on her recent hospitalization. Appointment made with Laurann Montana, NP on Thursday. Pt was also told that she needs to see Dr. Acie Fredrickson in the office however, she is already established with Dr. Sallyanne Kuster and is happy to continue with him. Pt verbalizes understanding.

## 2022-05-16 NOTE — Telephone Encounter (Signed)
Please go ahead and order the 14-day monitor for cryptogenic stroke ("live" [facility] if covered by insurance, otherwise regular zio). Do not have to wait for  the office visit.

## 2022-05-16 NOTE — Telephone Encounter (Signed)
Called pt back to let her know orders for monitor placed and will be sent to her house. Pt verbalizes understanding.

## 2022-05-16 NOTE — Progress Notes (Deleted)
Cardiology Clinic Note   Patient Name: Dawn Harmon Date of Encounter: 05/16/2022  Primary Care Provider:  Harlan Stains, MD Primary Cardiologist:  None  Patient Profile    Dawn Harmon is an 81 year-old female with a past medical history of PVCs, HLD, fibromyalgia, chronic fatigue syndrome, CKD stage IIIa, and recent CVA who presents to the clinic today for hospital follow-up.   Past Medical History    Past Medical History:  Diagnosis Date   Chronic fatigue fibromyalgia syndrome    CKD (chronic kidney disease)    Depression    Insomnia    Osteoarthritis    neck   Osteoporosis    Past Surgical History:  Procedure Laterality Date   BUNIONECTOMY     HERNIA REPAIR     TONSILLECTOMY      Allergies  Allergies  Allergen Reactions   Bupropion Other (See Comments)    Unknown reaction   Doxepin Hcl Other (See Comments)    Unknown reaction    Duloxetine Hcl Other (See Comments)    Unknown reaction    Hydroxyzine Other (See Comments)    Unknown reaction    Levofloxacin Other (See Comments)    insomnia   Mirtazapine Other (See Comments)    Unknown reaction    Nitrofurantoin Other (See Comments)    Unknown reaction    Serotonin Reuptake Inhibitors (Ssris) Other (See Comments)    Unknown reaction    Temazepam Other (See Comments)    Unknown reaction    Zolpidem Tartrate Other (See Comments)    Unknown reaction     History of Present Illness    Dawn Harmon has a past medical history of: PVCs/NSVT. 24 hour event monitor October 2015: 5820 PVCs and occasional brief runs of NSVT with longest run at 5 beats rate 111 bpm.  HLD. Lipid panel 05/13/2022: LDL 132, HDL 77, TG, 74, total cholesterol 224. CVA.  MR Brain 05/13/2022: Positive for small, subtle acute lacunar infarct left posterior lentiform/external capsule. No associated hemorrhage or mass effect.  CTA Head/Neck 05/13/2022: Negative for large vessel occlusion. Generalized arterial tortuosity but minimal  atherosclerosis in head and neck with no arterial stenosis. Left lentiform lacunar infarct remains occult by CT. No new intracranial abnormality.   Fibromyalgia. Chronic fatigue syndrome.  CKD stage IIIa.   Dawn Harmon is a patient of Dr. Sallyanne Kuster since August 2015 when she saw him for frequent PVCs and NSVT with normal Echo and treadmill stress test. 24 hour event monitor in October 2015 showed 5820 PVCs and occasional brief runs of NSVT with longest run at 5 beats rate 111 bpm. She had no cardiac awareness of arrhythmias. BB not started secondary to low BP. Last seen in office 02/02/2021 for palpitations and chest discomfort several months prior. At that time she reported some cardiac awareness of PVCs in the evenings only. It was decided she would not be a candidate for a BB secondary to complaints of chronic fatigue. She was not showing signs of heart failure. No diagnostics were ordered.   Patient was admitted to the hospital on 05/13/2022 for right sided weakness. MRI showed small, acute lacunar infarction in the left posterior lentiform/external capsule. She was evaluated by Dr. Acie Fredrickson during hospital admission on 05/14/2022. She was started on a trial of Toprol XL 25 mg daily. She is also taking ASA and plavix. 14 day zio live monitor was ordered and mailed to patient. If no evidence of Afib may consider implantable loop.   Today,  patient ***  Home Medications    No outpatient medications have been marked as taking for the 05/18/22 encounter (Appointment) with Loel Dubonnet, NP.    Family History    Family History  Problem Relation Age of Onset   Breast cancer Mother    Suicidality Father    Arthritis Father    Depression Father    Breast cancer Sister    She indicated that her mother is deceased. She indicated that her father is deceased. She indicated that her sister is alive.   Social History    Social History   Socioeconomic History   Marital status: Married    Spouse  name: Not on file   Number of children: 2   Years of education: HS   Highest education level: Not on file  Occupational History   Occupation: Retired  Tobacco Use   Smoking status: Never   Smokeless tobacco: Never  Vaping Use   Vaping Use: Never used  Substance and Sexual Activity   Alcohol use: Yes    Comment: May have one glass of wine   Drug use: No   Sexual activity: Not on file  Other Topics Concern   Not on file  Social History Narrative   Lives at home with her husband.   Right-handed.   1-2 cups caffeine per day.   Social Determinants of Health   Financial Resource Strain: Not on file  Food Insecurity: Not on file  Transportation Needs: Not on file  Physical Activity: Not on file  Stress: Not on file  Social Connections: Not on file  Intimate Partner Violence: Not on file     Review of Systems    General: *** No chills, fever, night sweats or weight changes.  Cardiovascular:  No chest pain, dyspnea on exertion, edema, orthopnea, palpitations, paroxysmal nocturnal dyspnea. Dermatological: No rash, lesions/masses Respiratory: No cough, dyspnea Urologic: No hematuria, dysuria Abdominal:   No nausea, vomiting, diarrhea, bright red blood per rectum, melena, or hematemesis Neurologic:  No visual changes, weakness, changes in mental status. All other systems reviewed and are otherwise negative except as noted above.  Physical Exam    VS:  There were no vitals taken for this visit. , BMI There is no height or weight on file to calculate BMI. GEN: *** Well nourished, well developed, in no acute distress. HEENT: Normal. Neck: Supple, no JVD, carotid bruits, or masses. Cardiac: RRR, no murmurs, rubs, or gallops. No clubbing, cyanosis, edema.  Radials/DP/PT 2+ and equal bilaterally.  Respiratory:  Respirations regular and unlabored, clear to auscultation bilaterally. GI: Soft, nontender, nondistended. MS: No deformity or atrophy. Skin: Warm and dry, no rash. Neuro:  Strength and sensation are intact. Psych: Normal affect.  Accessory Clinical Findings     Recent Labs: 05/12/2022: Hemoglobin 14.9; Platelets 339 05/13/2022: TSH 1.741 05/14/2022: BUN 13; Creatinine, Ser 1.18; Magnesium 2.2; Potassium 3.9; Sodium 138   Recent Lipid Panel    Component Value Date/Time   CHOL 224 (H) 05/13/2022 1620   TRIG 74 05/13/2022 1620   TRIG 64 04/12/2006 1122   HDL 77 05/13/2022 1620   CHOLHDL 2.9 05/13/2022 1620   VLDL 15 05/13/2022 1620   LDLCALC 132 (H) 05/13/2022 1620   LDLDIRECT 128.8 02/28/2011 1011    No BP recorded.  {Refresh Note OR Click here to enter BP  :1}***    ECG personally reviewed by me today ***  Unchanged from ***  CHA2DS2-VASc Score =    The patient's score is  based upon:   {Click here to calculate score.  REFRESH note before signing. :1}      Assessment & Plan   CVA. MR Brain 05/13/2022 showed small, acute lacunar infarct left posterior lentiform/external capsule. *** Continue aspirin and plavix.  PVCs/NSVT. 24 hour event monitor October 2015 showed 5820 PVCs and occasional brief runs of NSVT. Patient has not been on a beta blocker in the past secondary to low BP and chronic fatigue. She was started on a trial of Toprol XL 25 mg in the hospital. *** HLD. Last lipid panel 05/13/2022 showed LDL of 132. She was started on rosuvastatin 20 mg upon discharge from hospital. *** CKD Stage IIIa. ***   Disposition: ***   Justice Britain. Affan Callow, NP-C     05/16/2022, 1:42 PM [facility] [address] Office [phone] Fax [phone]   I spent *** minutes examining this patient, reviewing medications, and using patient centered shared decision making involving her cardiac care.  Prior to her visit I spent greater than 20 minutes reviewing her past medical history,  medications, and prior cardiac tests.

## 2022-05-16 NOTE — Progress Notes (Unsigned)
Enrolled for [employer] to mail a ZIO AT Live Telemetry monitor to patients address on file.  

## 2022-05-16 NOTE — Telephone Encounter (Signed)
Patient calling, because she was told she needed a heart monitor after being seen in the hospital. She says it also told her to see Dr. Acie Fredrickson, but she is established with Dr. Sallyanne Kuster. Patient states she is very confused.

## 2022-05-17 ENCOUNTER — Encounter (HOSPITAL_BASED_OUTPATIENT_CLINIC_OR_DEPARTMENT_OTHER): Payer: Self-pay

## 2022-05-17 ENCOUNTER — Telehealth (HOSPITAL_BASED_OUTPATIENT_CLINIC_OR_DEPARTMENT_OTHER): Payer: Self-pay | Admitting: Family

## 2022-05-17 NOTE — Telephone Encounter (Signed)
Left message for patient to call and reschedule 05/18/22 1:00pm appt with Laurann [place], NP---provider is not in the office

## 2022-05-18 ENCOUNTER — Ambulatory Visit (HOSPITAL_BASED_OUTPATIENT_CLINIC_OR_DEPARTMENT_OTHER): Payer: Medicare HMO | Admitting: Family

## 2022-05-19 ENCOUNTER — Telehealth: Payer: Self-pay | Admitting: Cardiovascular Disease

## 2022-05-19 NOTE — Telephone Encounter (Signed)
Spoke with pt regarding monitor and change in appointment at [facility]. Pt will be seeing Elwyn Reach, NP on 11/29 at 10:05am. Pt also had questions regarding her monitor. All questions answered about monitor. Pt is also concerned with placing the monitor and how complicated that will be. Pt will read over the instructions and try to place monitor. Advised pt that if she was not successful to call our office on Monday and we will set her up at [facility] to have monitor placed. Pt verbalizes understanding.

## 2022-05-19 NOTE — Telephone Encounter (Signed)
Patient states she has questinons regarding her upcoming appointment and heart monitor.

## 2022-05-23 ENCOUNTER — Telehealth: Payer: Self-pay | Admitting: Cardiovascular Disease

## 2022-05-23 NOTE — Telephone Encounter (Signed)
She needs it done before the appt.

## 2022-05-23 NOTE — Telephone Encounter (Signed)
Called pt to discuss monitor. Per Dr Sallyanne Kuster, he wants pt to wear the monitor and come in for appointment to discuss monitor results. Pt is very frustrated and she does not think that she can put the [facility] monitor on. She will either try after her nap or in the morning. Informed pt that we only has 1 shot to put on the monitor, informed her that if she does not think she can put it on do not attempt to do so. Verbalized understanding. Sent a message to monitor dept at [place]. Waiting for reply. Went over [place] monitor directions so it might be easier for her to apply.  Please call pt tomorrow (11-22) but do not call pt before 9 am can call until noon or after 4pm.  Pt will call back if she cannot apply the monitor. Appt rescheduled to 07-25-2022 so she can be seen after we figure out her monitor issue.   Pt states that she asymptomatic and does not feel the PVC's or the AFIB.   ZIO XT- Long Term Monitor Instructions  Your physician has requested you wear a ZIO patch monitor for 14 days.  This is a single patch monitor. [employer] supplies one patch monitor per enrollment. Additional stickers are not available. Please do not apply patch if you will be having a Nuclear Stress Test,  Echocardiogram, Cardiac CT, MRI, or Chest Xray during the period you would be wearing the  monitor. The patch cannot be worn during these tests. You cannot remove and re-apply the  ZIO XT patch monitor.  Your ZIO patch monitor will be mailed 3 day USPS to your address on file. It may take 3-5 days  to receive your monitor after you have been enrolled.  Once you have received your monitor, please review the enclosed instructions. Your monitor  has already been registered assigning a specific monitor serial # to you.  Billing and Patient Assistance Program Information  We have supplied [employer] with any of your insurance information on file for billing purposes. [employer] offers a sliding scale Patient Assistance Program for  patients that do not have  insurance, or whose insurance does not completely cover the cost of the ZIO monitor.  You must apply for the Patient Assistance Program to qualify for this discounted rate.  To apply, please call [employer] at [phone], select option 4, select option 2, ask to apply for  Patient Assistance Program. Theodore Demark will ask your household income, and how many people  are in your household. They will quote your out-of-pocket cost based on that information.  [employer] will also be able to set up a 53-month interest-free payment plan if needed.  Applying the monitor   Shave hair from upper left chest.  Hold abrader disc by orange tab. Rub abrader in 40 strokes over the upper left chest as  indicated in your monitor instructions.  Clean area with 4 enclosed alcohol pads. Let dry.  Apply patch as indicated in monitor instructions. Patch will be placed under collarbone on left  side of chest with arrow pointing upward.  Rub patch adhesive wings for 2 minutes. Remove white label marked "1". Remove the white  label marked "2". Rub patch adhesive wings for 2 additional minutes.  While looking in a mirror, press and release button in center of patch. A small green light will  flash 3-4 times. This will be your only indicator that the monitor has been turned on.  Do not shower for the first 24 hours. You  may shower after the first 24 hours.  Press the button if you feel a symptom. You will hear a small click. Record Date, Time and  Symptom in the Patient Logbook.  When you are ready to remove the patch, follow instructions on the last 2 pages of Patient  Logbook. Stick patch monitor onto the last page of Patient Logbook.  Place Patient Logbook in the blue and white box. Use locking tab on box and tape box closed  securely. The blue and white box has prepaid postage on it. Please place it in the mailbox as  soon as possible. Your physician should have your test results approximately  7 days after the  monitor has been mailed back to [facility].  Call [employer] at [phone] if you have questions regarding  your ZIO XT patch monitor. Call them immediately if you see an orange light blinking on your  monitor.  If your monitor falls off in less than 4 days, contact our Monitor department at [phone].  If your monitor becomes loose or falls off after 4 days call [employer] at [phone] for  suggestions on securing your monitor

## 2022-05-23 NOTE — Telephone Encounter (Signed)
Ferd Hibbs is calling, he said, the heart monitor was delivered on 05/19/22 but pt has not activating it, they called pt and was told pt is waiting for her appt on 05/31/22. They're are calling to let Dr. Loletha Grayer know

## 2022-05-24 ENCOUNTER — Ambulatory Visit: Payer: Medicare HMO | Attending: Cardiology

## 2022-05-24 ENCOUNTER — Ambulatory Visit: Payer: Medicare HMO

## 2022-05-24 DIAGNOSIS — I493 Ventricular premature depolarization: Secondary | ICD-10-CM | POA: Diagnosis not present

## 2022-05-24 DIAGNOSIS — I639 Cerebral infarction, unspecified: Secondary | ICD-10-CM | POA: Diagnosis not present

## 2022-05-24 NOTE — Telephone Encounter (Signed)
See nurse visit notes

## 2022-05-24 NOTE — Telephone Encounter (Signed)
Patient stated she will stop by clinic today to have the zio applied.

## 2022-05-24 NOTE — Progress Notes (Signed)
H&V Care Navigation CSW Progress Note  Clinical Social Worker contacted patient by phone to f/u on concerns related to in home care needs and assistance/pt stress as relayed by Judson Roch, Therapist, sports. No answer at [phone], left voicemail requesting call back if interested in assistance. Will reattempt as able.   Patient is participating in a Managed Medicaid Plan:  No, self pay only.   SDOH Screenings   Tobacco Use: Low Risk  (05/12/2022)    Westley Hummer, MSW, [place]  [phone]- work cell phone (preferred) [phone]- desk phone

## 2022-05-24 NOTE — Progress Notes (Signed)
At 1:30-2:00 pm: Per phone note, patient came to clinic to have Zio monitor, Live MCOT applied for cryptogenic stroke per Dr. Sallyanne Kuster. Monitor was applied at 1:45 and verified with [employer] that it was working. Explained how to use the monitor, transmitter, how to click the monitor,  and the diary. Patient stated, "Can you write it all down for me because I have brain fog?" I  wrote for her and read it to her. During visit, patient reported feeling tired and exhausted due to her illnesses, the monitor, and taking care of her husband and her home. Informed her that I will place a referral to our clinic social worker to secure help for her. She agreed, but stated, "I don't know what good it will do." During conversation, she said, "I just want to end it all. I'm tired of living." I had someone go to the DOD for assistance. Trixie Dredge, RN came to assist. Patient did not want to stay to get assistance and wanted to get her husband from his pulmonary appointment. Went over [facility] form with patient. She reread it herself and said,"  It says nothing about suicide." I explained that it is a general from and that if she wanted to add suicidal, that she could. She then said, "God won't let me kill myself. I have been this way for years." She refused to sign the AMA form and left clinic area at 2:27 pm. The AMA form was sent for scanning.

## 2022-05-29 ENCOUNTER — Telehealth: Payer: Self-pay | Admitting: Licensed Clinical Social Worker

## 2022-05-29 NOTE — Progress Notes (Signed)
Heart and Vascular Care Navigation  05/29/2022  Dawn Harmon [birth year] [id number]  Reason for Referral: voiced home challenges; stress Patient is participating in a Managed Medicaid Plan: No, Aetna Medicare only  Engaged with patient by telephone for initial visit for Heart and Vascular Care Coordination.                                                                                                   Assessment:                                     LCSW was able to reach pt this afternoon. Introduced self, role, reason for call. Pt confirmed home address, current PCP and emergency contacts. Denies challenges with affording or obtaining basic needs at this time. She and husband are trying to manage best they can. Her children and faith community are in [place], [place] so often she feels overwhelmed by managing all their care. Pt shares that she is having stress around specifically their home- she is interested in cleaning services rather than PCS. I shared unfortunately I do not have specific home cleaning services that I can recommend due to liability but I can send her the Senior Line at [employer] and they can speak with them about other options there may be. Pt also expressed she would like a PCP perhaps closer to their home- I will send our PCP list.   No additional concerns voiced at this time, no noted discussion of self harm or harm to others. She is understandably overwhelmed and without major support system nearby. I encouraged her to call if any additional questions concerns arise.   HRT/VAS Care Coordination     Patients Home Cardiology Office [place] Team Social Worker   Social Worker Name: Westley Hummer LCSW [phone]   Living arrangements for the past 2 months Single Family Home   Lives with: Spouse   Patient Current Insurance Coverage Managed Medicare   Patient Has Concern With Paying Medical Bills No   Does Patient Have  Prescription Coverage? Yes       Social History:                                                                             [place]: No Food Insecurity (05/29/2022)  Housing: Low Risk  (05/29/2022)  Transportation Needs: No Transportation Needs (05/29/2022)  Utilities: Not At Risk (05/29/2022)  Financial Resource Strain: Low Risk  (05/29/2022)  Stress: Stress Concern Present (05/29/2022)  Tobacco Use: Low Risk  (05/12/2022)    SDOH Interventions: Financial Resources:  Financial Strain Interventions: Intervention Not Indicated  Food Insecurity:  Food Insecurity Interventions: Intervention Not  Indicated  Housing Insecurity:  Housing Interventions: Intervention Not Indicated  Transportation:   Transportation Interventions: Intervention Not Indicated    Other Care Navigation Interventions:     Patient Referred to: Development worker, community for community resources   Follow-up plan:   LCSW mailed pt the following: my card, Senior Line, and PCP list. I will f/u to ensure it has been received.   Westley Hummer, MSW, [place]  [phone]- work cell phone (preferred) [phone]- desk phone

## 2022-05-31 ENCOUNTER — Ambulatory Visit (HOSPITAL_BASED_OUTPATIENT_CLINIC_OR_DEPARTMENT_OTHER): Payer: Medicare HMO | Admitting: Nurse Practitioner

## 2022-06-07 ENCOUNTER — Encounter: Payer: Self-pay | Admitting: Family Medicine

## 2022-06-07 ENCOUNTER — Ambulatory Visit (INDEPENDENT_AMBULATORY_CARE_PROVIDER_SITE_OTHER): Payer: Medicare HMO | Admitting: Family Medicine

## 2022-06-07 ENCOUNTER — Telehealth: Payer: Self-pay | Admitting: Licensed Clinical Social Worker

## 2022-06-07 VITALS — BP 112/60 | HR 72 | Temp 97.9°F | Ht 64.5 in | Wt 155.8 lb

## 2022-06-07 DIAGNOSIS — G9332 Myalgic encephalomyelitis/chronic fatigue syndrome: Secondary | ICD-10-CM | POA: Diagnosis not present

## 2022-06-07 DIAGNOSIS — G4733 Obstructive sleep apnea (adult) (pediatric): Secondary | ICD-10-CM

## 2022-06-07 DIAGNOSIS — Z8673 Personal history of transient ischemic attack (TIA), and cerebral infarction without residual deficits: Secondary | ICD-10-CM | POA: Diagnosis not present

## 2022-06-07 DIAGNOSIS — I63312 Cerebral infarction due to thrombosis of left middle cerebral artery: Secondary | ICD-10-CM

## 2022-06-07 MED ORDER — ROSUVASTATIN CALCIUM 20 MG PO TABS: 20.0000 mg | ORAL_TABLET | Freq: Every day | ORAL | 1 refills | Status: DC

## 2022-06-07 NOTE — Telephone Encounter (Signed)
H&V Care Navigation CSW Progress Note  Clinical Social Worker contacted patient by phone to f/u on in home care needs and assistance/resources. No answer at [phone], left voicemail requesting call back if interested in further assistance.  Patient is participating in a Managed Medicaid Plan:  No, Aetna Medicare only  [place]: No Food Insecurity (05/29/2022)  Housing: Low Risk  (05/29/2022)  Transportation Needs: No Transportation Needs (05/29/2022)  Utilities: Not At Risk (05/29/2022)  Depression (PHQ2-9): Medium Risk (06/07/2022)  Financial Resource Strain: Low Risk  (05/29/2022)  Stress: Stress Concern Present (05/29/2022)  Tobacco Use: Low Risk  (06/07/2022)    Westley Hummer, MSW, [place]  [phone]- work cell phone (preferred) [phone]- desk phone

## 2022-06-07 NOTE — Assessment & Plan Note (Signed)
Patient has had this for several decades, has tried multiple medications and has had extensive work up in the past. Is seeing neurology and cardiology for her recent CVA, getting work up from them. We had a long discussion about medications and therapy, pt states she has "tried everything" and does not seem open to trying other medications. She reports that the lexapro 5 mg "takes the edge off" for her and makes her symptoms tolerable. We also had a long discussion about the possible OSA and the sleep disruptions she is experiencing. I advised that she should be re-tested for OSA and if this is positive that treatment for this condition will improve her chronic fatigue and tiredness. Patient agreed to the referral to pulmonary.

## 2022-06-07 NOTE — Progress Notes (Signed)
New Patient Office Visit  Subjective    Patient ID: Dawn Harmon, female    DOB: [birth year]  Age: 81 y.o. MRN: [id number]  CC:  Chief Complaint  Patient presents with   Establish Care    HPI Dawn Harmon presents to establish care Pt states she used to see [facility] physicians. Then she tried the senior care center but was not happy there.   She was recently discharged from the hospital due to an acute lacunar infarct. She has follow up with the cardiologist for ectopy, history of NSVT, she has been wearing a heart monitor, today is the last day. States that she was placed on statin medication (rosuvastatin 20 mg daily) and also she was given 21 days of clopidogrel 75 mg once daily. She was also started on metoprolol 25 mg daily. She reports she has completed the plavix course and remains on the statin medication. States that she does not want to take the cholesterol medication, states her daughter told her that it was not good for her to take. We had a long discussion about the risks and the benefits of taking the statin medication, we reviewed adverse reactions, we also discussed the benefits of taking the medication as well.   Patient reports a history of chronic fatigue syndrome, states that it has progressively worsened over the last few years, thinks that it started after she received the COVID vaccines. States that she always feels weak shaky, some days she has nausea and lots of tiredness. States she does have chronic neck pain. Patient has had these symptoms for decades, since 1985. Patient reports that she does often has trouble with sleep. States it takes her around 45 minutes to fall asleep, states that its very elusive for her. States that she gets 5-6 hours but she wakes up frequently. States she does have a diagnosis of sleep apnea. States that the study was done several years ago by a naturopath and she never tried the CPAP machine. We had a long discussion about possibly sending her   to get a repeat study.   Outpatient Encounter Medications as of 06/07/2022  Medication Sig   ASHWAGANDHA PO Take 1 Capful by mouth daily at 12 noon.   aspirin EC 81 MG tablet Take 1 tablet (81 mg total) by mouth daily. Swallow whole.   Cranberry 300 MG tablet Take 300 mg by mouth daily.   escitalopram (LEXAPRO) 5 MG tablet Take 5 mg by mouth daily.   metoprolol succinate (TOPROL-XL) 25 MG 24 hr tablet Take 1 tablet (25 mg total) by mouth daily.   Multiple Vitamin (MULTIVITAMIN) tablet Take 1 tablet by mouth daily.   TURMERIC PO Take 1 capsule by mouth daily.   [DISCONTINUED] rosuvastatin (CRESTOR) 20 MG tablet Take 1 tablet (20 mg total) by mouth daily.   rosuvastatin (CRESTOR) 20 MG tablet Take 1 tablet (20 mg total) by mouth daily.   [DISCONTINUED] clopidogrel (PLAVIX) 75 MG tablet Take 1 tablet (75 mg total) by mouth daily.   No facility-administered encounter medications on file as of 06/07/2022.    Past Medical History:  Diagnosis Date   Chronic fatigue fibromyalgia syndrome    CKD (chronic kidney disease)    Depression    Insomnia    Osteoarthritis    neck   Osteoporosis     Past Surgical History:  Procedure Laterality Date   BUNIONECTOMY     HERNIA REPAIR     TONSILLECTOMY      Family  History  Problem Relation Age of Onset   Breast cancer Mother    Suicidality Father    Arthritis Father    Depression Father    Breast cancer Sister     Social History   Socioeconomic History   Marital status: Married    Spouse name: Not on file   Number of children: 2   Years of education: HS   Highest education level: Not on file  Occupational History   Occupation: Retired  Tobacco Use   Smoking status: Never   Smokeless tobacco: Never  Vaping Use   Vaping Use: Never used  Substance and Sexual Activity   Alcohol use: Yes    Comment: May have one glass of wine   Drug use: No   Sexual activity: Not on file  Other Topics Concern   Not on file  Social History  Narrative   Lives at home with her husband.   Right-handed.   1-2 cups caffeine per day.   Social Determinants of Health   Financial Resource Strain: Low Risk  (05/29/2022)   Overall Financial Resource Strain (CARDIA)    Difficulty of Paying Living Expenses: Not very hard  Food Insecurity: No Food Insecurity (05/29/2022)   Hunger Vital Sign    Worried About Running Out of Food in the Last Year: Never true    Ran Out of Food in the Last Year: Never true  Transportation Needs: No Transportation Needs (05/29/2022)   PRAPARE - Hydrologist (Medical): No    Lack of Transportation (Non-Medical): No  Physical Activity: Not on file  Stress: Stress Concern Present (05/29/2022)   [place]    Feeling of Stress : Rather much  Social Connections: Not on file  Intimate Partner Violence: Not on file    Review of Systems  All other systems reviewed and are negative.       Objective    BP 112/60 (BP Location: Right Arm, Patient Position: Sitting, Cuff Size: Normal)   Pulse 72   Temp 97.9 F (36.6 C) (Oral)   Ht 5' 4.5" (1.638 m)   Wt 155 lb 12.8 oz (70.7 kg)   SpO2 99%   BMI 26.33 kg/m   Physical Exam Vitals reviewed.  Constitutional:      Appearance: Normal appearance. She is well-groomed and normal weight.  Eyes:     Conjunctiva/sclera: Conjunctivae normal.  Neck:     Thyroid: No thyromegaly.  Cardiovascular:     Rate and Rhythm: Normal rate and regular rhythm.     Pulses: Normal pulses.     Heart sounds: S1 normal and S2 normal.  Pulmonary:     Effort: Pulmonary effort is normal.     Breath sounds: Normal breath sounds and air entry.  Abdominal:     General: Bowel sounds are normal.  Musculoskeletal:     Right lower leg: No edema.     Left lower leg: No edema.  Neurological:     Mental Status: She is alert and oriented to person, place, and time. Mental status is at  baseline.     Gait: Gait is intact.  Psychiatric:        Mood and Affect: Affect normal. Mood is depressed. Affect is not flat or tearful.        Speech: Speech normal.        Behavior: Behavior normal.        Judgment: Judgment normal.  Assessment & Plan:   Problem List Items Addressed This Visit       Unprioritized   Chronic fatigue syndrome - Primary    Patient has had this for several decades, has tried multiple medications and has had extensive work up in the past. Is seeing neurology and cardiology for her recent CVA, getting work up from them. We had a long discussion about medications and therapy, pt states she has "tried everything" and does not seem open to trying other medications. She reports that the lexapro 5 mg "takes the edge off" for her and makes her symptoms tolerable. We also had a long discussion about the possible OSA and the sleep disruptions she is experiencing. I advised that she should be re-tested for OSA and if this is positive that treatment for this condition will improve her chronic fatigue and tiredness. Patient agreed to the referral to pulmonary.       CVA (cerebral vascular accident) ([place])    I advised that she continue the statin as prescribed for risk factor reduction in the setting of acute CVA last month. Script for rosuvastatin 20 mg daily sent to the pharmacy. Continue to follow up with cardiology and neurology.      Relevant Medications   rosuvastatin (CRESTOR) 20 MG tablet   Other Visit Diagnoses     Obstructive sleep apnea       Relevant Orders   Ambulatory referral to Pulmonology     I spent 47 minutes today with the patient reviewing her discharge documentation from the hospital, in addition to obtaining a thorough history and discussing risks/benefits of medications, referral for sleep study, etc. I will see her back in 6 months to repeat her lipid panel and CMP.  Return in about 6 months (around 12/07/2022) for follow up HLD.    Farrel Conners, MD

## 2022-06-07 NOTE — Assessment & Plan Note (Signed)
I advised that she continue the statin as prescribed for risk factor reduction in the setting of acute CVA last month. Script for rosuvastatin 20 mg daily sent to the pharmacy. Continue to follow up with cardiology and neurology.

## 2022-06-14 ENCOUNTER — Telehealth: Payer: Self-pay | Admitting: Licensed Clinical Social Worker

## 2022-06-14 NOTE — Telephone Encounter (Signed)
H&V Care Navigation CSW Progress Note  Clinical Social Worker  has not received f/u from pt at this time  for further assistance. Will not actively follow at this time, should pt wish to engage in care navigation in the future I remain available.    Patient is participating in a Managed Medicaid Plan:  No, [employer] Medicare only.   SDOH Screenings   Food Insecurity: No Food Insecurity (05/29/2022)  Housing: Low Risk  (05/29/2022)  Transportation Needs: No Transportation Needs (05/29/2022)  Utilities: Not At Risk (05/29/2022)  Depression (PHQ2-9): Medium Risk (06/07/2022)  Financial Resource Strain: Low Risk  (05/29/2022)  Stress: Stress Concern Present (05/29/2022)  Tobacco Use: Low Risk  (06/07/2022)    Westley Hummer, MSW, [place]  [phone]- work cell phone (preferred) [phone]- desk phone

## 2022-06-15 ENCOUNTER — Telehealth: Payer: Self-pay | Admitting: Cardiovascular Disease

## 2022-06-15 NOTE — Telephone Encounter (Signed)
Returned call to [employer] at [place]. She reported that the last strip for the patient showed high burden of Vtach. Spoke with patient who does not remember anything unusual for those days. She stated she has so many symptoms the it is hard to sort them all. She is unable to give BP and P while on phone because her husband isn't there to help her with it. She then stated she had diarrhea and dizziness last night and this AM and took Pepto-bismol. Denies fever. Recommended she contact PCP or urgent care if diarrhea persists. She is staying hydrated. Dr. Claiborne Billings (DOD) given rhythm to review. No new orders at this time.

## 2022-06-15 NOTE — Telephone Encounter (Signed)
[facility] is calling to report a notification of additional findings on patient's heart monitor results that were just posted. Ref # is [id number]. Please advise.

## 2022-06-18 IMAGING — US US RENAL
1 series · 14 of 25 positions shown · non-contrast
Comparison: None.

CLINICAL DATA: CKD

EXAM:
RENAL / URINARY TRACT ULTRASOUND COMPLETE

[Series 1: us renal · 14 of 42 slices shown]
[im 1/42]
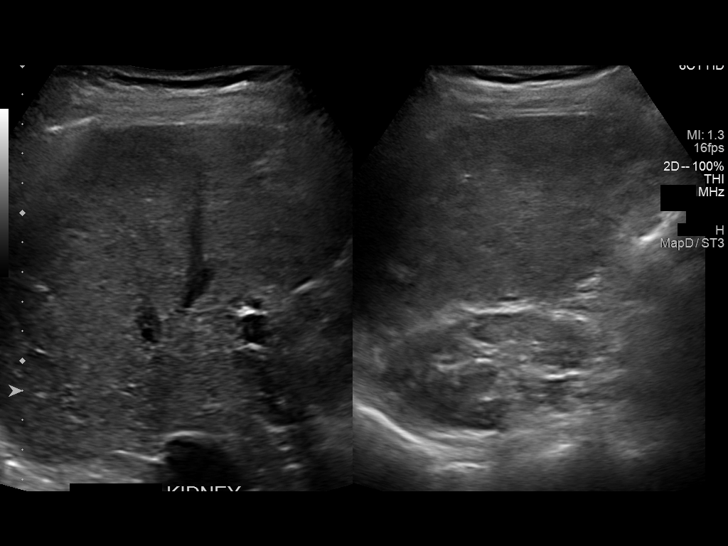
[im 4/42]
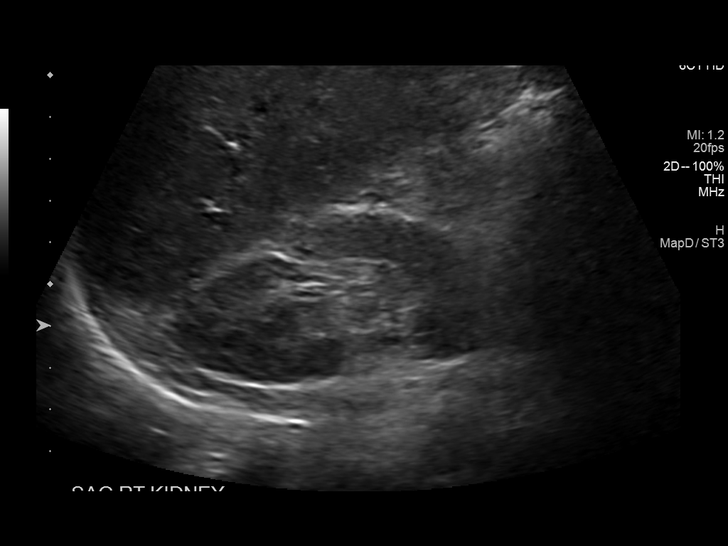
[im 7/42]
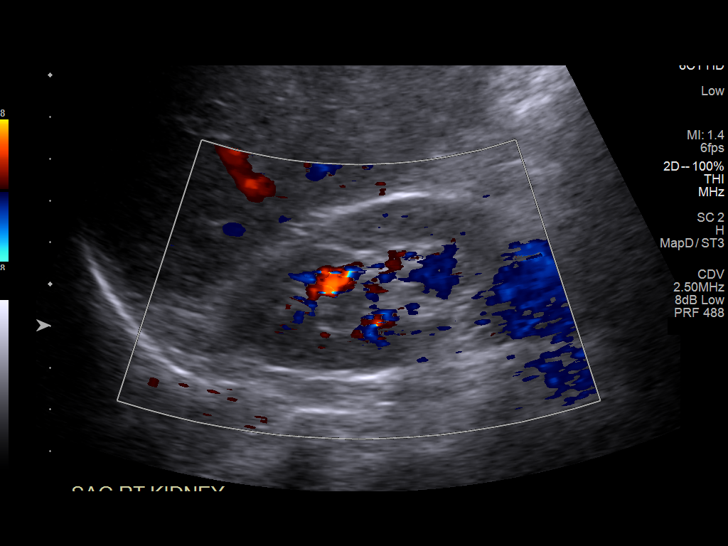
[im 11/42]
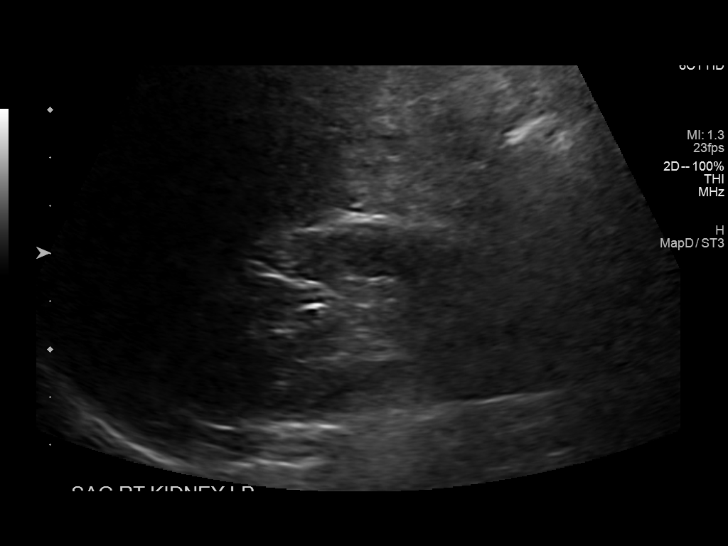
[im 14/42]
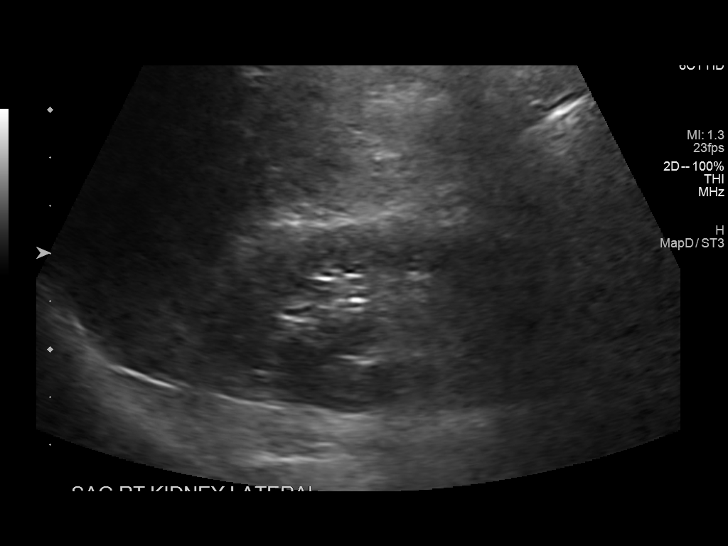
[im 16/42]
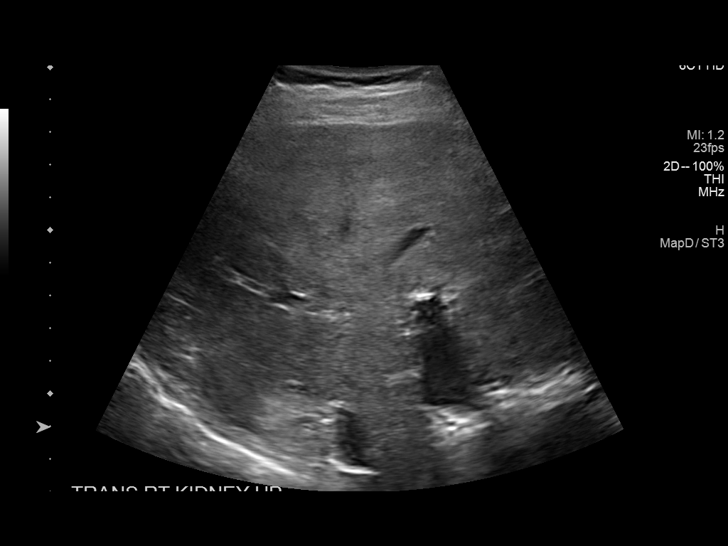
[im 19/42]
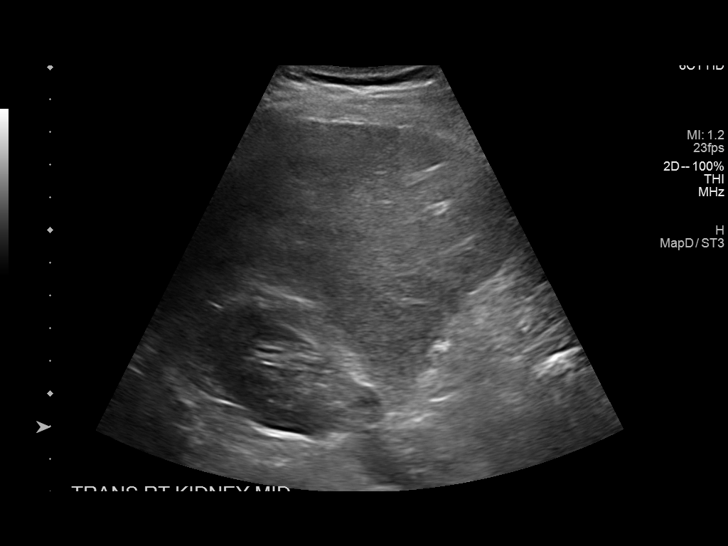
[im 23/42]
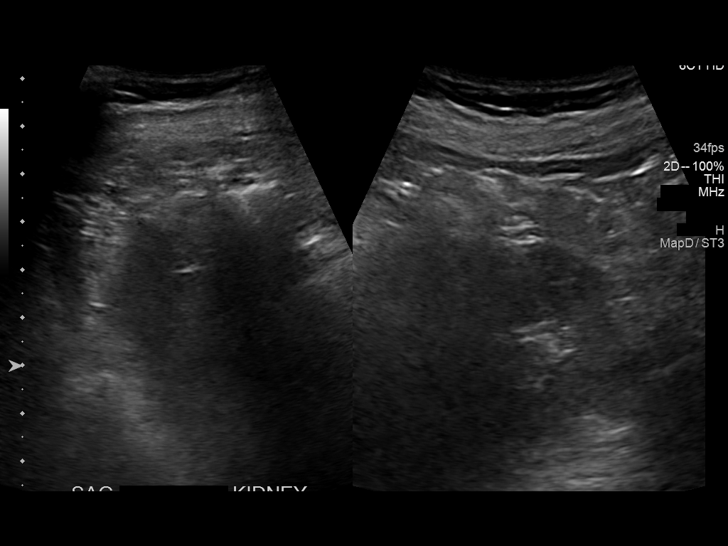
[im 26/42]
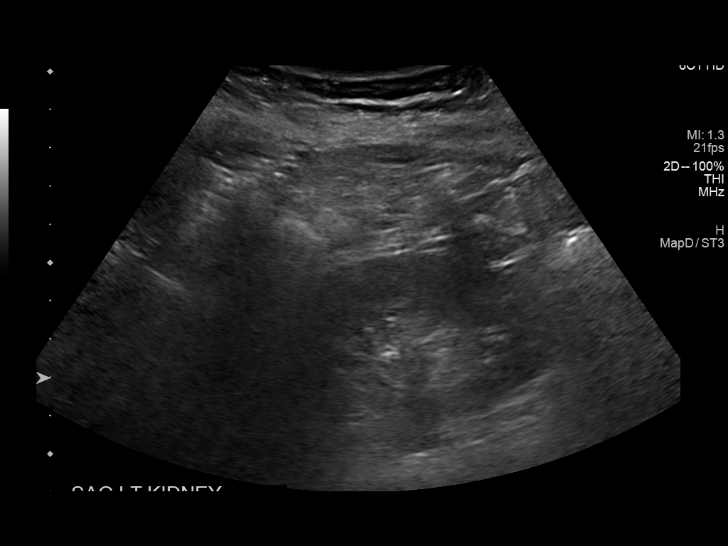
[im 28/42]
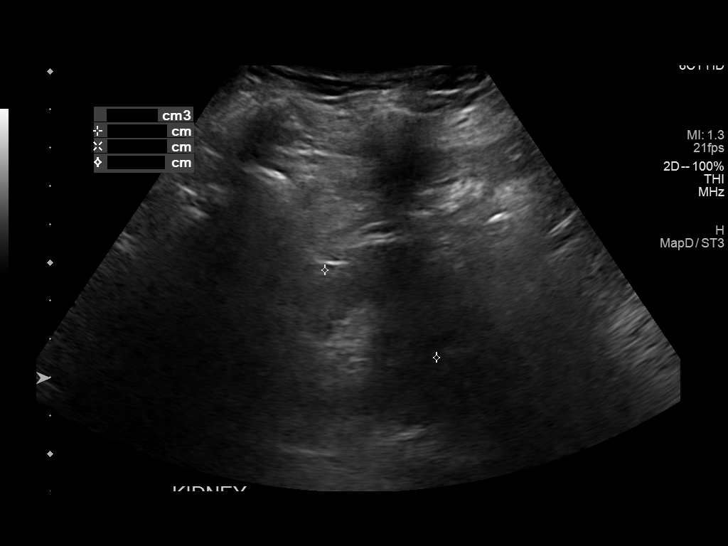
[im 31/42]
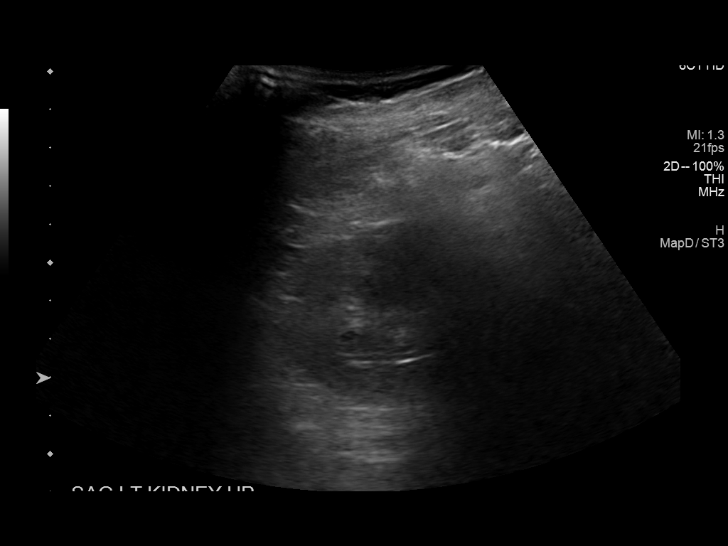
[im 35/42]
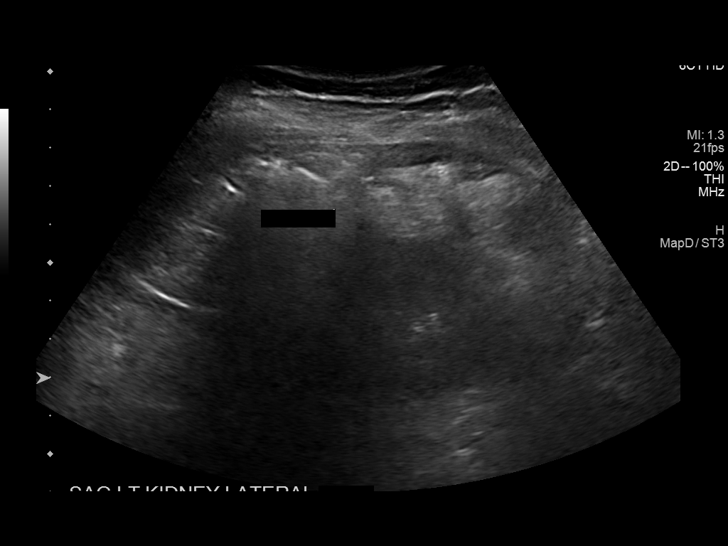
[im 38/42]
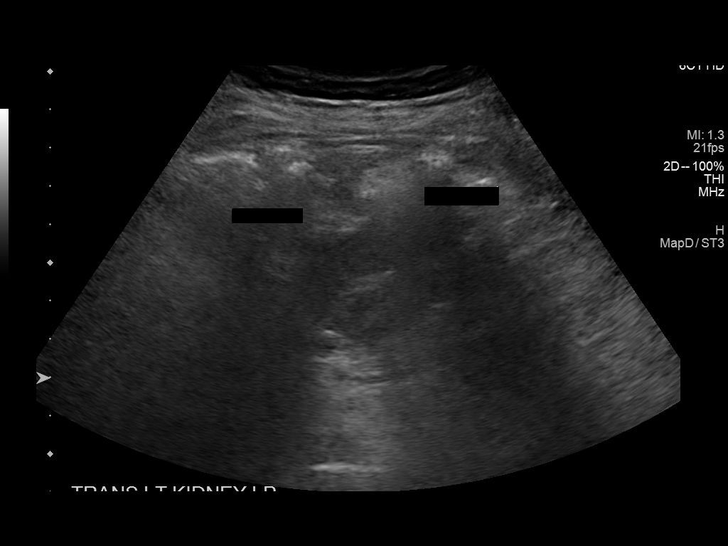
[im 42/42]
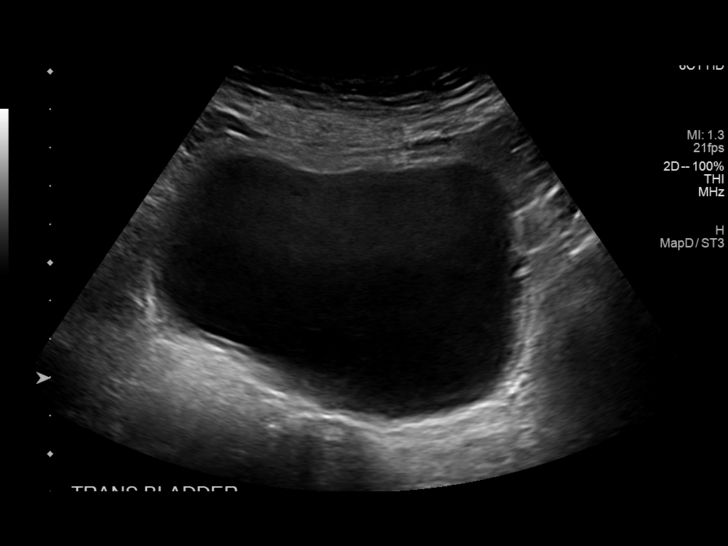

[14 of 25 positions shown; findings below may reference images not displayed]

FINDINGS: Right Kidney:

Renal measurements: 7.3 x 3.9 x 3.0 cm = volume: 45 mL. Echogenicity
within normal limits. No mass or hydronephrosis visualized.

Left Kidney:

Visualization is limited by shadowing bowel gas. renal measurements:
7.7 x 4.3 x 3.7 cm = volume: 64 mL. Echogenicity within normal
limits. No definite mass or hydronephrosis.

Bladder:

Appears normal for degree of bladder distention.

Other:

None.
IMPRESSION: 1.  Unremarkable sonographic appearance of the right kidney.

2. Limited visualization of the left kidney. No definite
abnormality.

## 2022-06-20 ENCOUNTER — Telehealth: Payer: Self-pay | Admitting: Family Medicine

## 2022-06-20 NOTE — Telephone Encounter (Signed)
Left a detailed message at the patient's home number to call the office for an appt as a visit is needed and if the provider decides urine tests are needed, this will be done during the visit.

## 2022-06-20 NOTE — Telephone Encounter (Signed)
Patient wants to drop off a urine specimen, thinks she has a UTI

## 2022-06-21 ENCOUNTER — Other Ambulatory Visit: Payer: Self-pay | Admitting: Family Medicine

## 2022-06-21 ENCOUNTER — Encounter: Payer: Self-pay | Admitting: Family Medicine

## 2022-06-21 ENCOUNTER — Ambulatory Visit (INDEPENDENT_AMBULATORY_CARE_PROVIDER_SITE_OTHER): Payer: Medicare HMO | Admitting: Family Medicine

## 2022-06-21 VITALS — BP 112/70 | HR 75 | Temp 97.7°F | Ht 64.5 in | Wt 154.9 lb

## 2022-06-21 DIAGNOSIS — R350 Frequency of micturition: Secondary | ICD-10-CM | POA: Diagnosis not present

## 2022-06-21 DIAGNOSIS — N3941 Urge incontinence: Secondary | ICD-10-CM

## 2022-06-21 DIAGNOSIS — N3001 Acute cystitis with hematuria: Secondary | ICD-10-CM

## 2022-06-21 LAB — POC URINALSYSI DIPSTICK (AUTOMATED)
Bilirubin, UA: NEGATIVE
Glucose, UA: NEGATIVE
Ketones, UA: NEGATIVE
Nitrite, UA: NEGATIVE
Protein, UA: NEGATIVE
Spec Grav, UA: 1.015 (ref 1.010–1.025)
Urobilinogen, UA: 0.2 E.U./dL
pH, UA: 6 (ref 5.0–8.0)

## 2022-06-21 MED ORDER — TOLTERODINE TARTRATE 1 MG PO TABS: 1.0000 mg | ORAL_TABLET | Freq: Two times a day (BID) | ORAL | 0 refills | Status: DC

## 2022-06-21 MED ORDER — CIPROFLOXACIN HCL 250 MG PO TABS: 250.0000 mg | ORAL_TABLET | Freq: Two times a day (BID) | ORAL | 0 refills | Status: AC

## 2022-06-21 NOTE — Progress Notes (Signed)
Established Patient Office Visit  Subjective   Patient ID: Dawn Harmon, female    DOB: 05-28-1941  Age: 81 y.o. MRN: [id number]  Chief Complaint  Patient presents with   Urinary Frequency    X1 week    Pt is reporting an increase in urgency and frequency with urination, states that there is no burning with urination, no fever/chills, no back pain. Pt states that she just had a UTI in October, was seen at the senior center for this and was given cipro which did resolve the pain.   Patient is also reporting an increase in urge incontinence symptoms. She reports that often it will "come out of nowhere" and she immediately has to use the bathroom. Patient is describing daily increase in urges. States that been going on for at least a year. She denies any stress incontinence, no leakage with coughing, laughing or jumping.       Review of Systems  All other systems reviewed and are negative.     Objective:     BP 112/70 (BP Location: Left Arm, Patient Position: Sitting, Cuff Size: Normal)   Pulse 75   Temp 97.7 F (36.5 C) (Oral)   Ht 5' 4.5" (1.638 m)   Wt 154 lb 14.4 oz (70.3 kg)   SpO2 96%   BMI 26.18 kg/m    Physical Exam Constitutional:      Appearance: Normal appearance. She is normal weight.  Cardiovascular:     Rate and Rhythm: Normal rate and regular rhythm.  Pulmonary:     Effort: Pulmonary effort is normal.     Breath sounds: Normal breath sounds.  Abdominal:     General: Abdomen is flat. Bowel sounds are normal.     Tenderness: There is no right CVA tenderness or left CVA tenderness.  Neurological:     Mental Status: She is alert.      Results for orders placed or performed in visit on 06/21/22  POCT Urinalysis Dipstick (Automated)  Result Value Ref Range   Color, UA yellow    Clarity, UA cloudy    Glucose, UA Negative Negative   Bilirubin, UA negative    Ketones, UA negative    Spec Grav, UA 1.015 1.010 - 1.025   Blood, UA small    pH, UA 6.0  5.0 - 8.0   Protein, UA Negative Negative   Urobilinogen, UA 0.2 0.2 or 1.0 E.U./dL   Nitrite, UA negative    Leukocytes, UA Large (3+) (A) Negative      The ASCVD Risk score (Arnett DK, et al., 2019) failed to calculate for the following reasons:   The 2019 ASCVD risk score is only valid for ages 58 to 48   The patient has a prior MI or stroke diagnosis    Assessment & Plan:   Problem List Items Addressed This Visit       Unprioritized   Urinary tract infection   Relevant Orders   Large LE and small blood on UA today indicates UTI, will treat with 5 days of cipro 250 mg BID. I wll send the urine for culture to confirm, is she continues to have recurrent UTI will need to discuss preventative medications....  Culture, Urine   Other Visit Diagnoses     Urinary frequency    -  Primary   Relevant Orders   POCT Urinalysis Dipstick (Automated) (Completed)   Urge incontinence       Relevant Medications   Patient is describing significant  symptoms of urge incontinence. We discussed medications to help treat this, pt is agreeable to trying tolterodine 1 mg BID at home, if this improves her sx she was encouraged to call for refills.  tolterodine (DETROL) 1 MG tablet       No follow-ups on file.    Farrel Conners, MD

## 2022-06-22 ENCOUNTER — Encounter: Payer: Self-pay | Admitting: Neurology

## 2022-06-22 ENCOUNTER — Ambulatory Visit: Payer: Medicare HMO | Admitting: Neurology

## 2022-06-22 VITALS — BP 112/71 | HR 70 | Ht 64.0 in | Wt 155.0 lb

## 2022-06-22 DIAGNOSIS — I639 Cerebral infarction, unspecified: Secondary | ICD-10-CM | POA: Diagnosis not present

## 2022-06-22 NOTE — Progress Notes (Signed)
Chief Complaint  Patient presents with   Follow-up    Rm 14 with spouse Dawn Harmon Pt is well and stable, no concerns. Reports she feels like her normal self       ASSESSMENT AND PLAN  Dawn Harmon is a 81 y.o. 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   MEDICAL HISTORY:  Dawn Harmon is a 81 year old female, seen in request by her primary care doctor, Dawn Harmon, for evaluation of stroke, initial evaluation was on June 22, 2022  I reviewed and summarized the referring note. PMHX HLD Depression Chronic fatigue syndrome.  She was admitted to the hospital on November 10-12 2023,  She woke up November 10, had transient right arm weakness, has to use her left hand to move her right arm, lasting for few minutes  The same afternoon, while grocery shopping, she had noticed right leg weakness, difficulty bearing weight, presenting to the emergency room, at her worse, she complains of difficulty lifting right arm against gravity  Personally reviewed MRI of the brain without contrast May 13, 2022, small subtle acute lacunar infarction in left posterior lentiform/external capsule CT angiogram of head and neck showed no large vessel disease  She is now back to her baseline, at baseline she suffered chronic fatigue syndrome, multiple physical symptoms, extreme fatigue,  She was treated with aspirin plus Plavix for 3 weeks now on aspirin 81 mg alone, also had 2 weeks of cardiac monitoring, has pending cardiology follow-up visit  Echocardiogram  November 2023 showed no significant abnormality  Laboratory evaluations showed LDL 132, cholesterol 224, on Crestor 20 mg daily, A1c 5.2    PHYSICAL EXAM:   Vitals:   06/22/22 1241  BP: 112/71  Pulse: 70  Weight: 155 lb (70.3 kg)  Height: '5\' 4"'$  (1.626 m)   Not recorded     Body mass index is 26.61 kg/m.  PHYSICAL EXAMNIATION:  Gen: NAD, conversant, well nourised, well groomed                     Cardiovascular: Regular rate rhythm, no peripheral edema, warm, nontender. Eyes: Conjunctivae clear without exudates or hemorrhage Neck: Supple, no carotid bruits. Pulmonary: Clear to auscultation bilaterally   NEUROLOGICAL EXAM:  MENTAL STATUS: Speech/cognition: Depressed looking elderly female, awake, alert, oriented to history taking and casual conversation CRANIAL NERVES: CN II: Visual fields are full to confrontation. Pupils are round equal and briskly reactive to light. CN III, IV, VI: extraocular movement are normal. No ptosis. CN V: Facial sensation is intact to light touch CN VII: Face is symmetric with normal eye closure  CN VIII: Hearing is normal to causal conversation. CN IX, X: Phonation is normal. CN XI: Head turning and shoulder shrug are intact  MOTOR: There is no pronator drift of out-stretched arms. Muscle bulk and tone are normal. Muscle strength is normal.  REFLEXES: Reflexes are 2+ and symmetric at the biceps, triceps, knees, and ankles. Plantar responses are flexor.  SENSORY: Intact to light touch, pinprick and vibratory sensation are intact in fingers and toes.  COORDINATION: There  is no trunk or limb dysmetria noted.  GAIT/STANCE: Push up to get up from seated position cautious,  REVIEW OF SYSTEMS:  Full 14 system review of systems performed and notable only for as above All other review of systems were negative.   ALLERGIES: Allergies  Allergen Reactions   Bupropion Other (See Comments)    Unknown reaction   Doxepin Hcl Other (See  Comments)    Unknown reaction    Duloxetine Hcl Other (See Comments)    Unknown reaction    Hydroxyzine Other (See Comments)    Unknown reaction    Levofloxacin Other (See Comments)    insomnia   Mirtazapine Other (See Comments)    Unknown reaction    Nitrofurantoin Other (See Comments)    Unknown reaction    Serotonin Reuptake Inhibitors (Ssris) Other (See Comments)    Unknown reaction    Temazepam Other (See Comments)    Unknown reaction    Zolpidem Tartrate Other (See Comments)    Unknown reaction     HOME MEDICATIONS: Current Outpatient Medications  Medication Sig Dispense Refill   ASHWAGANDHA PO Take 1 Capful by mouth daily at 12 noon.     aspirin EC 81 MG tablet Take 1 tablet (81 mg total) by mouth daily. Swallow whole.     ciprofloxacin (CIPRO) 250 MG tablet Take 1 tablet (250 mg total) by mouth 2 (two) times daily for 5 days. 10 tablet 0   Cranberry 300 MG tablet Take 300 mg by mouth daily.     escitalopram (LEXAPRO) 5 MG tablet Take 5 mg by mouth daily.     metoprolol succinate (TOPROL-XL) 25 MG 24 hr tablet Take 1 tablet (25 mg total) by mouth daily. 30 tablet 2   Multiple Vitamin (MULTIVITAMIN) tablet Take 1 tablet by mouth daily.     rosuvastatin (CRESTOR) 20 MG tablet Take 1 tablet (20 mg total) by mouth daily. 90 tablet 1   tolterodine (DETROL) 1 MG tablet Take 1 tablet (1 mg total) by mouth 2 (two) times daily. 60 tablet 0   TURMERIC PO Take 1 capsule by mouth daily.     No current facility-administered medications for this visit.    PAST MEDICAL HISTORY: Past Medical History:  Diagnosis Date   Chronic fatigue fibromyalgia syndrome    CKD (chronic kidney disease)    Depression    Insomnia    Osteoarthritis    neck   Osteoporosis     PAST SURGICAL HISTORY: Past Surgical History:  Procedure Laterality Date   BUNIONECTOMY     HERNIA REPAIR     TONSILLECTOMY      FAMILY HISTORY: Family History  Problem Relation Age of Onset   Breast  cancer Mother    Suicidality Father    Arthritis Father    Depression Father    Breast cancer Sister     SOCIAL HISTORY: Social History   Socioeconomic History   Marital status: Married    Spouse name: Not on file   Number of children: 2   Years of education: HS   Highest education level: Not on file  Occupational History   Occupation: Retired  Tobacco Use   Smoking status: Never   Smokeless tobacco: Never  Vaping Use   Vaping Use: Never used  Substance and Sexual Activity   Alcohol use: Yes    Comment: May have one glass of wine   Drug use: No   Sexual activity: Not on file  Other Topics Concern  Not on file  Social History Narrative   Lives at home with her husband.   Right-handed.   1-2 cups caffeine per day.   Social Determinants of Health   Financial Resource Strain: Low Risk  (05/29/2022)   Overall Financial Resource Strain (CARDIA)    Difficulty of Paying Living Expenses: Not very hard  Food Insecurity: No Food Insecurity (05/29/2022)   Hunger Vital Sign    Worried About Running Out of Food in the Last Year: Never true    Ran Out of Food in the Last Year: Never true  Transportation Needs: No Transportation Needs (05/29/2022)   PRAPARE - Hydrologist (Medical): No    Lack of Transportation (Non-Medical): No  Physical Activity: Not on file  Stress: Stress Concern Present (05/29/2022)   [place]    Feeling of Stress : Rather much  Social Connections: Not on file  Intimate Partner Violence: Not on file      Marcial Pacas, M.D. Ph.D.  [facility] [address] [place], [place] [address] Ph: [phone] Fax: [phone]  CC:  Rosalin Hawking, MD [place],  [place] [address]  Dawn Conners, MD

## 2022-06-23 ENCOUNTER — Telehealth: Payer: Self-pay | Admitting: Cardiovascular Disease

## 2022-06-23 LAB — URINE CULTURE
MICRO NUMBER:: 14340299
SPECIMEN QUALITY:: ADEQUATE

## 2022-06-23 NOTE — Telephone Encounter (Signed)
Received call in to triage from Sam from [facility]. Dr. Percival Spanish (DOD) shown report. No new orders at this time. Spoke with patient on 12/14 regarding same monitor results. Spoke with patient today. Her diarrhea has stopped. She reported lightheadedness with metoprolol succinite. Made appointment with Dr Sallyanne Kuster for 1/8.

## 2022-06-23 NOTE — Telephone Encounter (Signed)
Sam - [facility]  calling to give additional findings after final result

## 2022-06-27 MED ORDER — AMPICILLIN 500 MG PO CAPS: 500.0000 mg | ORAL_CAPSULE | Freq: Four times a day (QID) | ORAL | 0 refills | Status: AC

## 2022-06-27 NOTE — Addendum Note (Signed)
Addended by: Farrel Conners on: 06/27/2022 08:58 AM   Modules accepted: Orders

## 2022-07-05 ENCOUNTER — Other Ambulatory Visit: Payer: Self-pay

## 2022-07-05 MED ORDER — METOPROLOL SUCCINATE ER 50 MG PO TB24: ORAL_TABLET | ORAL | 3 refills | Status: DC

## 2022-07-10 ENCOUNTER — Encounter: Payer: Self-pay | Admitting: Cardiovascular Disease

## 2022-07-10 ENCOUNTER — Ambulatory Visit (INDEPENDENT_AMBULATORY_CARE_PROVIDER_SITE_OTHER): Payer: Medicare HMO

## 2022-07-10 ENCOUNTER — Ambulatory Visit: Payer: Medicare HMO | Attending: Cardiovascular Disease | Admitting: Cardiovascular Disease

## 2022-07-10 VITALS — BP 138/84 | HR 68 | Ht 64.0 in | Wt 155.0 lb

## 2022-07-10 DIAGNOSIS — I493 Ventricular premature depolarization: Secondary | ICD-10-CM

## 2022-07-10 DIAGNOSIS — I639 Cerebral infarction, unspecified: Secondary | ICD-10-CM

## 2022-07-10 DIAGNOSIS — I444 Left anterior fascicular block: Secondary | ICD-10-CM | POA: Diagnosis not present

## 2022-07-10 NOTE — Progress Notes (Signed)
Cardiology Office Note:    Date:  07/11/2022   ID:  Dawn Harmon, DOB 1941-05-24, MRN [id number]  PCP:  Farrel Conners, MD   [place] Providers Cardiologist:  None     Referring MD: Farrel Conners, MD   No chief complaint on file. Dawn Harmon is a 82 y.o. female who is being seen today for the evaluation of chest discomfort and palpitations at the request of Farrel Conners, MD.   History of Present Illness:    Dawn Harmon is a 82 y.o. female with a hx of chronic fatigue syndrome that has plagued her over 30 years, worse in the last 10 years, occasional palpitations, which were found to represent frequent PVCs and brief bursts of nonsustained ventricular tachycardia on previous arrhythmia monitoring.  She was recently hospitalized with acute weakness of the right lower extremity, an acute lacunar stroke on 05/12/2022 felt to be due to small vessel disease (low suspicion that it was cardioembolic from atrial fibrillation).  During that hospitalization she had frequent PVCs and bursts of nonsustained ventricular tachycardia up to 14 beats in duration.  These were consistently asymptomatic.  Atrial fibrillation was not seen.  Her lower extremity weakness has largely resolved.  Previous workup for her ventricular arrhythmia in 2015 showed normal left ventricular systolic function (EF 10-17%, no LVH, no valvular abnormalities) and ECG treadmill stress testing was normal with the exception of frequent PVCs and ventricular couplets.  Her echocardiogram was repeated during her recent hospitalization and showed normal LVEF 51-02%, grade 1 diastolic dysfunction, no valvular problems.  Her biggest complaints have nothing to do with her recent stroke or with the arrhythmia (she is oblivious to this), but rather with her longstanding complaints of extreme fatigue.  As she has told me before, she has no desire to live, she wishes that she would die, since her fatigue is  tolerable.   Past Medical History:  Diagnosis Date   Chronic fatigue fibromyalgia syndrome    CKD (chronic kidney disease)    Depression    Insomnia    Osteoarthritis    neck   Osteoporosis     Past Surgical History:  Procedure Laterality Date   BUNIONECTOMY     HERNIA REPAIR     TONSILLECTOMY      Current Medications: Current Meds  Medication Sig   ASHWAGANDHA PO Take 1 Capful by mouth daily at 12 noon.   aspirin EC 81 MG tablet Take 1 tablet (81 mg total) by mouth daily. Swallow whole.   Cranberry 300 MG tablet Take 300 mg by mouth daily.   escitalopram (LEXAPRO) 5 MG tablet Take 5 mg by mouth daily.   Multiple Vitamin (MULTIVITAMIN) tablet Take 1 tablet by mouth daily.   TURMERIC PO Take 1 capsule by mouth daily.     Allergies:   Bupropion, Doxepin hcl, Duloxetine hcl, Hydroxyzine, Levofloxacin, Mirtazapine, Nitrofurantoin, Serotonin reuptake inhibitors (ssris), Temazepam, and Zolpidem tartrate   Social History   Socioeconomic History   Marital status: Married    Spouse name: Not on file   Number of children: 2   Years of education: HS   Highest education level: Not on file  Occupational History   Occupation: Retired  Tobacco Use   Smoking status: Never   Smokeless tobacco: Never  Vaping Use   Vaping Use: Never used  Substance and Sexual Activity   Alcohol use: Yes    Comment: May have one glass of wine   Drug use:  No   Sexual activity: Not on file  Other Topics Concern   Not on file  Social History Narrative   Lives at home with her husband.   Right-handed.   1-2 cups caffeine per day.   Social Determinants of Health   Financial Resource Strain: Low Risk  (05/29/2022)   Overall Financial Resource Strain (CARDIA)    Difficulty of Paying Living Expenses: Not very hard  Food Insecurity: No Food Insecurity (05/29/2022)   Hunger Vital Sign    Worried About Running Out of Food in the Last Year: Never true    Ran Out of Food in the Last Year: Never  true  Transportation Needs: No Transportation Needs (05/29/2022)   PRAPARE - Hydrologist (Medical): No    Lack of Transportation (Non-Medical): No  Physical Activity: Not on file  Stress: Stress Concern Present (05/29/2022)   [place]    Feeling of Stress : Rather much  Social Connections: Not on file     Family History: The patient's family history includes Arthritis in her father; Breast cancer in her mother and sister; Depression in her father; Suicidality in her father.  ROS:   Please see the history of present illness.     All other systems reviewed and are negative.  EKGs/Labs/Other Studies Reviewed:    The following studies were reviewed today:  ECG treadmill stress test 2015  Frequent PVC's and couplets with exercise  No diagnostic ST segment changes concerning for ischemia  Fair exercise tolerance  No chest pain   Echocardiogram 05/14/2022    1. Left ventricular ejection fraction, by estimation, is 60 to 65%. Left  ventricular ejection fraction by 2D MOD biplane is 64.0 %. The left  ventricle has normal function. The left ventricle has no regional wall  motion abnormalities. Left ventricular  diastolic parameters are consistent with Grade I diastolic dysfunction  (impaired relaxation).   2. Right ventricular systolic function is normal. The right ventricular  size is normal. There is normal pulmonary artery systolic pressure. The  estimated right ventricular systolic pressure is 16.1 mmHg.   3. The mitral valve is abnormal. Mild mitral valve regurgitation.   4. The aortic valve is tricuspid. Aortic valve regurgitation is not  visualized. Aortic valve sclerosis is present, with no evidence of aortic  valve stenosis.   5. The inferior vena cava is dilated in size with >50% respiratory  variability, suggesting right atrial pressure of 8 mmHg.   6. Rhythm strip during  this exam demonstrates NSVT.   Comparison(s): Prior images unable to be directly viewed, comparison made  by report only. Changes from prior study are noted. 05/19/2014: LVEF  50-55%.     EKG:  EKG is not ordered today.  The ekg ordered 05/14/2022 is personally reviewed and demonstrates sinus rhythm with frequent PVCs and left anterior fascicular block, unchanged from previous tracings.  Normal QTc interval.  Recent Labs: 05/12/2022: Hemoglobin 14.9; Platelets 339 05/13/2022: TSH 1.741 05/14/2022: BUN 13; Creatinine, Ser 1.18; Magnesium 2.2; Potassium 3.9; Sodium 138  Recent Lipid Panel    Component Value Date/Time   CHOL 224 (H) 05/13/2022 1620   TRIG 74 05/13/2022 1620   TRIG 64 04/12/2006 1122   HDL 77 05/13/2022 1620   CHOLHDL 2.9 05/13/2022 1620   VLDL 15 05/13/2022 1620   LDLCALC 132 (H) 05/13/2022 1620   LDLDIRECT 128.8 02/28/2011 1011    Risk Assessment/Calculations:  Physical Exam:    VS:  BP 138/84 (BP Location: Left Arm, Patient Position: Sitting, Cuff Size: Normal)   Pulse 68   Ht '5\' 4"'$  (1.626 m)   Wt 155 lb (70.3 kg)   SpO2 98%   BMI 26.61 kg/m     Wt Readings from Last 3 Encounters:  07/10/22 155 lb (70.3 kg)  06/22/22 155 lb (70.3 kg)  06/21/22 154 lb 14.4 oz (70.3 kg)      General: Alert, oriented x3, no distress, looks tired Head: no evidence of trauma, PERRL, EOMI, no exophtalmos or lid lag, no myxedema, no xanthelasma; normal ears, nose and oropharynx Neck: normal jugular venous pulsations and no hepatojugular reflux; brisk carotid pulses without delay and no carotid bruits Chest: clear to auscultation, no signs of consolidation by percussion or palpation, normal fremitus, symmetrical and full respiratory excursions Cardiovascular: normal position and quality of the apical impulse, regular rhythm, normal first and second heart sounds, no murmurs, rubs or gallops Abdomen: no tenderness or distention, no masses by palpation, no abnormal  pulsatility or arterial bruits, normal bowel sounds, no hepatosplenomegaly Extremities: no clubbing, cyanosis or edema; 2+ radial, ulnar and brachial pulses bilaterally; 2+ right femoral, posterior tibial and dorsalis pedis pulses; 2+ left femoral, posterior tibial and dorsalis pedis pulses; no subclavian or femoral bruits Neurological: grossly nonfocal Psych: Normal mood and affect   ASSESSMENT:    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 PVCs (premature ventricular contractions)   3. LAFB (left anterior fascicular block)     PLAN:    In order of problems listed above:  Recent CVA: Has recovered without deficits.  Appears to be a lacunar stroke, rather than cardioembolic.  Will order 14-day monitor to again quantify the burden of ventricular arrhythmia and exclude atrial fibrillation with a little more certainty (at first she did not want to bother with this, but she did relent in the end).  Continue antiplatelet agents. PVCs:  these are minimally symptomatic, if at all.  Frequent ectopy was noted while I was examining her, but she was completely unaware of it.  No evidence of underlying structural heart disease to explain it.  Normal left ventricular function.  I do not think this is the cause of her fatigue.  Treatment with beta-blockers would likely make her fatigue worse.  She has tried taking metoprolol but this has caused repeated loose stools and she has stopped it.. Abnormal ECG: Previously seen to have generalized low voltage and I was worried a little bit about amyloidosis, but there is less appearance of low voltage on the most recent ECGs.  Suspicion for amyloidosis is low as there are no other clinical signs of this.  Left anterior fascicular block is likely just an age-related conduction abnormality. HLP: She does not want to take a statin.  She has already stopped the rosuvastatin that was prescribed.       Medication Adjustments/Labs and Tests Ordered: Current medicines are  reviewed at length with the patient today.  Concerns regarding medicines are outlined above.  Orders Placed This Encounter  Procedures   LONG TERM MONITOR (3-14 DAYS)   No orders of the defined types were placed in this encounter.    Patient Instructions  Medication Instructions:  Your physician recommends that you continue on your current medications as directed. Please refer to the Current Medication list given to you today.  *If you need a refill on your cardiac medications before your next appointment, please call your pharmacy*  Testing/Procedures: [place]- Long Term Monitor Instructions  Your physician has requested you wear a ZIO patch monitor for 14 days.  This is a single patch monitor. [employer] supplies one patch monitor per enrollment. Additional stickers are not available. Please do not apply patch if you will be having a Nuclear Stress Test,  Echocardiogram, Cardiac CT, MRI, or Chest Xray during the period you would be wearing the  monitor. The patch cannot be worn during these tests. You cannot remove and re-apply the  ZIO XT patch monitor.  Your ZIO patch monitor will be mailed 3 day USPS to your address on file. It may take 3-5 days  to receive your monitor after you have been enrolled.  Once you have received your monitor, please review the enclosed instructions. Your monitor  has already been registered assigning a specific monitor serial # to you.  Billing and Patient Assistance Program Information  We have supplied [employer] with any of your insurance information on file for billing purposes. [employer] offers a sliding scale Patient Assistance Program for patients that do not have  insurance, or whose insurance does not completely cover the cost of the ZIO monitor.  You must apply for the Patient Assistance Program to qualify for this discounted rate.  To apply, please call [employer] at [phone], select option 4, select option 2, ask to apply for  Patient  Assistance Program. Theodore Demark will ask your household income, and how many people  are in your household. They will quote your out-of-pocket cost based on that information.  [employer] will also be able to set up a 71-month interest-free payment plan if needed.  Applying the monitor   Shave hair from upper left chest.  Hold abrader disc by orange tab. Rub abrader in 40 strokes over the upper left chest as  indicated in your monitor instructions.  Clean area with 4 enclosed alcohol pads. Let dry.  Apply patch as indicated in monitor instructions. Patch will be placed under collarbone on left  side of chest with arrow pointing upward.  Rub patch adhesive wings for 2 minutes. Remove white label marked "1". Remove the white  label marked "2". Rub patch adhesive wings for 2 additional minutes.  While looking in a mirror, press and release button in center of patch. A small green light will  flash 3-4 times. This will be your only indicator that the monitor has been turned on.  Do not shower for the first 24 hours. You may shower after the first 24 hours.  Press the button if you feel a symptom. You will hear a small click. Record Date, Time and  Symptom in the Patient Logbook.  When you are ready to remove the patch, follow instructions on the last 2 pages of Patient  Logbook. Stick patch monitor onto the last page of Patient Logbook.  Place Patient Logbook in the blue and white box. Use locking tab on box and tape box closed  securely. The blue and white box has prepaid postage on it. Please place it in the mailbox as  soon as possible. Your physician should have your test results approximately 7 days after the  monitor has been mailed [facility] to [facility]  Call [facility] [phone] you have questions regarding  your ZIO XT patch monitor. Call them immediately if you see an orange light blinking on your  monitor.  If your monitor falls off in less than 4 days,  contact our Monitor department at [phone]  If your monitor becomes loose or falls off after 4 days call [employer] at [phone] for  suggestions on securing your monitor    Follow-Up: At [facility], you and your health needs are our priority.  As part of our continuing mission to provide you with exceptional heart care, we have created designated Provider Care Teams.  These Care Teams include your primary Cardiologist (physician) and Advanced Practice Providers (APPs -  Physician Assistants and Nurse Practitioners) who all work together to provide you with the care you need, when you need it.  We recommend signing up for the patient portal called "MyChart".  Sign up information is provided on this After Visit Summary.  MyChart is used to connect with patients for Virtual Visits (Telemedicine).  Patients are able to view lab/test results, encounter notes, upcoming appointments, etc.  Non-urgent messages can be sent to your provider as well.   To learn more about what you can do with MyChart, go to [email or web address].    Your next appointment:   12 month(s)  The format for your next appointment:   In Person  Provider:   Sanda Klein, MD   Signed, Sanda Klein, MD  07/11/2022 4:59 PM    [place]

## 2022-07-10 NOTE — Progress Notes (Unsigned)
Enrolled for [employer] to mail a ZIO XT long term holter monitor to the patients address on file.  

## 2022-07-10 NOTE — Patient Instructions (Addendum)
Medication Instructions:  Your physician recommends that you continue on your current medications as directed. Please refer to the Current Medication list given to you today.  *If you need a refill on your cardiac medications before your next appointment, please call your pharmacy*   Testing/Procedures: [place] Monitor Instructions  Your physician has requested you wear a ZIO patch monitor for 14 days.  This is a single patch monitor. [employer] supplies one patch monitor per enrollment. Additional stickers are not available. Please do not apply patch if you will be having a Nuclear Stress Test,  Echocardiogram, Cardiac CT, MRI, or Chest Xray during the period you would be wearing the  monitor. The patch cannot be worn during these tests. You cannot remove and re-apply the  ZIO XT patch monitor.  Your ZIO patch monitor will be mailed 3 day USPS to your address on file. It may take 3-5 days  to receive your monitor after you have been enrolled.  Once you have received your monitor, please review the enclosed instructions. Your monitor  has already been registered assigning a specific monitor serial # to you.  Billing and Patient Assistance Program Information  We have supplied [employer] with any of your insurance information on file for billing purposes. [employer] offers a sliding scale Patient Assistance Program for patients that do not have  insurance, or whose insurance does not completely cover the cost of the ZIO monitor.  You must apply for the Patient Assistance Program to qualify for this discounted rate.  To apply, please call [employer] at [phone], select option 4, select option 2, ask to apply for  Patient Assistance Program. Dawn Harmon will ask your household income, and how many people  are in your household. They will quote your out-of-pocket cost based on that information.  [employer] will also be able to set up a 60-month interest-free payment plan if needed.  Applying  the monitor   Shave hair from upper left chest.  Hold abrader disc by orange tab. Rub abrader in 40 strokes over the upper left chest as  indicated in your monitor instructions.  Clean area with 4 enclosed alcohol pads. Let dry.  Apply patch as indicated in monitor instructions. Patch will be placed under collarbone on left  side of chest with arrow pointing upward.  Rub patch adhesive wings for 2 minutes. Remove white label marked "1". Remove the white  label marked "2". Rub patch adhesive wings for 2 additional minutes.  While looking in a mirror, press and release button in center of patch. A small green light will  flash 3-4 times. This will be your only indicator that the monitor has been turned on.  Do not shower for the first 24 hours. You may shower after the first 24 hours.  Press the button if you feel a symptom. You will hear a small click. Record Date, Time and  Symptom in the Patient Logbook.  When you are ready to remove the patch, follow instructions on the last 2 pages of Patient  Logbook. Stick patch monitor onto the last page of Patient Logbook.  Place Patient Logbook in the blue and white box. Use locking tab on box and tape box closed  securely. The blue and white box has prepaid postage on it. Please place it in the mailbox as  soon as possible. Your physician should have your test results approximately 7 days after the  monitor has been [facility]  Call [facility]  at [phone] if you have questions regarding  your ZIO XT patch monitor. Call them immediately if you see an orange light blinking on your  monitor.  If your monitor falls off in less than 4 days, contact our Monitor department at [phone].  If your monitor becomes loose or falls off after 4 days call [employer] at [phone] for  suggestions on securing your monitor    Follow-Up: At [facility], you and your health needs are our priority.  As part  of our continuing mission to provide you with exceptional heart care, we have created designated Provider Care Teams.  These Care Teams include your primary Cardiologist (physician) and Advanced Practice Providers (APPs -  Physician Assistants and Nurse Practitioners) who all work together to provide you with the care you need, when you need it.  We recommend signing up for the patient portal called "MyChart".  Sign up information is provided on this After Visit Summary.  MyChart is used to connect with patients for Virtual Visits (Telemedicine).  Patients are able to view lab/test results, encounter notes, upcoming appointments, etc.  Non-urgent messages can be sent to your provider as well.   To learn more about what you can do with MyChart, go to [email or web address].    Your next appointment:   12 month(s)  The format for your next appointment:   In Person  Provider:   Sanda Klein, MD

## 2022-07-14 DIAGNOSIS — I493 Ventricular premature depolarization: Secondary | ICD-10-CM | POA: Diagnosis not present

## 2022-07-14 DIAGNOSIS — I639 Cerebral infarction, unspecified: Secondary | ICD-10-CM

## 2022-07-25 ENCOUNTER — Ambulatory Visit (HOSPITAL_BASED_OUTPATIENT_CLINIC_OR_DEPARTMENT_OTHER): Payer: Medicare HMO | Admitting: Family

## 2022-08-03 ENCOUNTER — Telehealth: Payer: Self-pay | Admitting: Cardiovascular Disease

## 2022-08-03 MED ORDER — PROPRANOLOL HCL 10 MG PO TABS: 10.0000 mg | ORAL_TABLET | Freq: Two times a day (BID) | ORAL | 11 refills | Status: DC

## 2022-08-03 NOTE — Telephone Encounter (Signed)
Spoke [employer] representative  Reporting final report of Monitor  Zio XT  Per representative , monitor showed  "VT"  on 07/23/22 at 11:04 central time  rate of 122 for 12 seconds.   The strip can be found on page 15  strip #5.   Monitor posted in patient's chart    Will defer to Dr Sallyanne Kuster

## 2022-08-03 NOTE — Telephone Encounter (Signed)
Dawn Harmon with I rhythm calling with critical zio monitor results

## 2022-08-03 NOTE — Telephone Encounter (Signed)
Will review. She has a long history of ventricular arrhythmia. No change in meds for now.

## 2022-08-04 ENCOUNTER — Telehealth: Payer: Self-pay | Admitting: *Deleted

## 2022-08-04 DIAGNOSIS — I4729 Other ventricular tachycardia: Secondary | ICD-10-CM

## 2022-08-04 NOTE — Telephone Encounter (Signed)
-----   Message from Sanda Klein, MD sent at 08/03/2022  4:46 PM EST ----- The monitor shows extremely frequent abnormal beats from the bottom chamber the heart including extremely frequent episodes of nonsustained ventricular tachycardia (VT). It is quite remarkable that the VT appears to occur only during expected times of being awake and physically active and seems to be abolished during rest. This suggest that it should respond to a beta-blocker.  She has discontinued metoprolol due to side effects (diarrhea), but is willing to try a different agent.  Will start propranolol 10 mg twice daily. The echocardiogram does not show signs of structural heart disease, which is a little reassuring, but she may benefit from cardiac MRI for better evaluation.  Recommend EP consultation. There was no evidence for atrial fibrillation that could explain her recent episode of stroke. I have called the patient with these findings and discussed recommendations with her. Dawn Harmon, I have sent a prescription in for propranolol.  Could you please make sure that Mrs. Dawn Harmon has a follow-up appointment with me in a few weeks?  Also please place a referral to EP for nonsustained ventricular tachycardia.

## 2022-08-04 NOTE — Telephone Encounter (Signed)
Unable to reach pt or leave a message  

## 2022-08-08 ENCOUNTER — Telehealth: Payer: Self-pay | Admitting: Family Medicine

## 2022-08-08 NOTE — Telephone Encounter (Signed)
error 

## 2022-08-10 ENCOUNTER — Telehealth: Payer: Self-pay | Admitting: Cardiovascular Disease

## 2022-08-10 DIAGNOSIS — I4729 Other ventricular tachycardia: Secondary | ICD-10-CM

## 2022-08-10 NOTE — Telephone Encounter (Signed)
Spoke with husband and patient. They haven't heard from a referral. Reviewed notes from 2/2. Patient is taking propranolol '10mg'$  twice daily. EP referral sent.

## 2022-08-10 NOTE — Telephone Encounter (Signed)
Husband calling to in regards to referral what was suppose to be sent for patient. Please advise

## 2022-08-11 NOTE — Telephone Encounter (Signed)
EP appointment has been scheduled

## 2022-08-18 ENCOUNTER — Encounter: Payer: Self-pay | Admitting: *Deleted

## 2022-08-21 ENCOUNTER — Ambulatory Visit (INDEPENDENT_AMBULATORY_CARE_PROVIDER_SITE_OTHER): Payer: Medicare HMO

## 2022-08-21 VITALS — Ht 64.0 in | Wt 155.0 lb

## 2022-08-21 DIAGNOSIS — Z0000 Encounter for general adult medical examination without abnormal findings: Secondary | ICD-10-CM

## 2022-08-21 NOTE — Patient Instructions (Addendum)
Dawn Harmon , Thank you for taking time to come for your Medicare Wellness Visit. I appreciate your ongoing commitment to your health goals. Please review the following plan we discussed and let me know if I can assist you in the future.   These are the goals we discussed:  Goals       No Current goals (pt-stated)        This is a list of the screening recommended for you and due dates:  Health Maintenance  Topic Date Due   DTaP/Tdap/Td vaccine (2 - Tdap) 07/03/2014   COVID-19 Vaccine (4 - 2023-24 season) 09/06/2022*   Zoster (Shingles) Vaccine (1 of 2) 11/19/2022*   Medicare Annual Wellness Visit  08/22/2023   Pneumonia Vaccine  Completed   Flu Shot  Completed   DEXA scan (bone density measurement)  Completed   HPV Vaccine  Aged Out  *Topic was postponed. The date shown is not the original due date.    Advanced directives: Please bring a copy of your health care power of attorney and living will to the office to be added to your chart at your convenience.   Conditions/risks identified: None  Next appointment: Follow up in one year for your annual wellness visit     Preventive Care 65 Years and Older, Female Preventive care refers to lifestyle choices and visits with your health care provider that can promote health and wellness. What does preventive care include? A yearly physical exam. This is also called an annual well check. Dental exams once or twice a year. Routine eye exams. Ask your health care provider how often you should have your eyes checked. Personal lifestyle choices, including: Daily care of your teeth and gums. Regular physical activity. Eating a healthy diet. Avoiding tobacco and drug use. Limiting alcohol use. Practicing safe sex. Taking low-dose aspirin every day. Taking vitamin and mineral supplements as recommended by your health care provider. What happens during an annual well check? The services and screenings done by your health care provider  during your annual well check will depend on your age, overall health, lifestyle risk factors, and family history of disease. Counseling  Your health care provider may ask you questions about your: Alcohol use. Tobacco use. Drug use. Emotional well-being. Home and relationship well-being. Sexual activity. Eating habits. History of falls. Memory and ability to understand (cognition). Work and work Statistician. Reproductive health. Screening  You may have the following tests or measurements: Height, weight, and BMI. Blood pressure. Lipid and cholesterol levels. These may be checked every 5 years, or more frequently if you are over 7 years old. Skin check. Lung cancer screening. You may have this screening every year starting at age 33 if you have a 30-pack-year history of smoking and currently smoke or have quit within the past 15 years. Fecal occult blood test (FOBT) of the stool. You may have this test every year starting at age 63. Flexible sigmoidoscopy or colonoscopy. You may have a sigmoidoscopy every 5 years or a colonoscopy every 10 years starting at age 49. Hepatitis C blood test. Hepatitis B blood test. Sexually transmitted disease (STD) testing. Diabetes screening. This is done by checking your blood sugar (glucose) after you have not eaten for a while (fasting). You may have this done every 1-3 years. Bone density scan. This is done to screen for osteoporosis. You may have this done starting at age 38. Mammogram. This may be done every 1-2 years. Talk to your health care provider about how  often you should have regular mammograms. Talk with your health care provider about your test results, treatment options, and if necessary, the need for more tests. Vaccines  Your health care provider may recommend certain vaccines, such as: Influenza vaccine. This is recommended every year. Tetanus, diphtheria, and acellular pertussis (Tdap, Td) vaccine. You may need a Td booster every  10 years. Zoster vaccine. You may need this after age 37. Pneumococcal 13-valent conjugate (PCV13) vaccine. One dose is recommended after age 74. Pneumococcal polysaccharide (PPSV23) vaccine. One dose is recommended after age 50. Talk to your health care provider about which screenings and vaccines you need and how often you need them. This information is not intended to replace advice given to you by your health care provider. Make sure you discuss any questions you have with your health care provider. Document Released: 07/16/2015 Document Revised: 03/08/2016 Document Reviewed: 04/20/2015 Elsevier Interactive Patient Education  2017 [place] Prevention in the Home Falls can cause injuries. They can happen to people of all ages. There are many things you can do to make your home safe and to help prevent falls. What can I do on the outside of my home? Regularly fix the edges of walkways and driveways and fix any cracks. Remove anything that might make you trip as you walk through a door, such as a raised step or threshold. Trim any bushes or trees on the path to your home. Use bright outdoor lighting. Clear any walking paths of anything that might make someone trip, such as rocks or tools. Regularly check to see if handrails are loose or broken. Make sure that both sides of any steps have handrails. Any raised decks and porches should have guardrails on the edges. Have any leaves, snow, or ice cleared regularly. Use sand or salt on walking paths during winter. Clean up any spills in your garage right away. This includes oil or grease spills. What can I do in the bathroom? Use night lights. Install grab bars by the toilet and in the tub and shower. Do not use towel bars as grab bars. Use non-skid mats or decals in the tub or shower. If you need to sit down in the shower, use a plastic, non-slip stool. Keep the floor dry. Clean up any water that spills on the floor as soon as it  happens. Remove soap buildup in the tub or shower regularly. Attach bath mats securely with double-sided non-slip rug tape. Do not have throw rugs and other things on the floor that can make you trip. What can I do in the bedroom? Use night lights. Make sure that you have a light by your bed that is easy to reach. Do not use any sheets or blankets that are too big for your bed. They should not hang down onto the floor. Have a firm chair that has side arms. You can use this for support while you get dressed. Do not have throw rugs and other things on the floor that can make you trip. What can I do in the kitchen? Clean up any spills right away. Avoid walking on wet floors. Keep items that you use a lot in easy-to-reach places. If you need to reach something above you, use a strong step stool that has a grab bar. Keep electrical cords out of the way. Do not use floor polish or wax that makes floors slippery. If you must use wax, use non-skid floor wax. Do not have throw rugs and other things  on the floor that can make you trip. What can I do with my stairs? Do not leave any items on the stairs. Make sure that there are handrails on both sides of the stairs and use them. Fix handrails that are broken or loose. Make sure that handrails are as long as the stairways. Check any carpeting to make sure that it is firmly attached to the stairs. Fix any carpet that is loose or worn. Avoid having throw rugs at the top or bottom of the stairs. If you do have throw rugs, attach them to the floor with carpet tape. Make sure that you have a light switch at the top of the stairs and the bottom of the stairs. If you do not have them, ask someone to add them for you. What else can I do to help prevent falls? Wear shoes that: Do not have high heels. Have rubber bottoms. Are comfortable and fit you well. Are closed at the toe. Do not wear sandals. If you use a stepladder: Make sure that it is fully opened.  Do not climb a closed stepladder. Make sure that both sides of the stepladder are locked into place. Ask someone to hold it for you, if possible. Clearly mark and make sure that you can see: Any grab bars or handrails. First and last steps. Where the edge of each step is. Use tools that help you move around (mobility aids) if they are needed. These include: Canes. Walkers. Scooters. Crutches. Turn on the lights when you go into a dark area. Replace any light bulbs as soon as they burn out. Set up your furniture so you have a clear path. Avoid moving your furniture around. If any of your floors are uneven, fix them. If there are any pets around you, be aware of where they are. Review your medicines with your doctor. Some medicines can make you feel dizzy. This can increase your chance of falling. Ask your doctor what other things that you can do to help prevent falls. This information is not intended to replace advice given to you by your health care provider. Make sure you discuss any questions you have with your health care provider. Document Released: 04/15/2009 Document Revised: 11/25/2015 Document Reviewed: 07/24/2014 Elsevier Interactive Patient Education  2017 [employer].

## 2022-08-21 NOTE — Progress Notes (Signed)
Subjective:   Dawn Harmon is a 82 y.o. female who presents for Medicare Annual (Subsequent) preventive examination.  Review of Systems    Virtual Visit via Telephone Note  I connected with  TUERE DARRIN on 08/21/22 at  3:45 PM EST by telephone and verified that I am speaking with the correct person using two identifiers.  Location: Patient: Home Provider: Office Persons participating in the virtual visit: patient/Nurse Health Advisor   I discussed the limitations, risks, security and privacy concerns of performing an evaluation and management service by telephone and the availability of in person appointments. The patient expressed understanding and agreed to proceed.  Interactive audio and video telecommunications were attempted between this nurse and patient, however failed, due to patient having technical difficulties OR patient did not have access to video capability.  We continued and completed visit with audio only.  Some vital signs may be absent or patient reported.   Criselda Peaches, LPN  Cardiac Risk Factors include: advanced age (>18mn, >>6women)     Objective:    Today's Vitals   08/21/22 1536  Weight: 155 lb (70.3 kg)  Height: 5' 4"$  (1.626 m)   Body mass index is 26.61 kg/m.     08/21/2022    3:42 PM 05/12/2022    6:17 PM  Advanced Directives  Does Patient Have a Medical Advance Directive? Yes No  Type of AParamedicof ASpencerLiving will   Copy of HBeatricein Chart? No - copy requested   Would patient like information on creating a medical advance directive?  No - Patient declined    Current Medications (verified) Outpatient Encounter Medications as of 08/21/2022  Medication Sig   ASHWAGANDHA PO Take 1 Capful by mouth daily at 12 noon.   aspirin EC 81 MG tablet Take 1 tablet (81 mg total) by mouth daily. Swallow whole.   Cranberry 300 MG tablet Take 300 mg by mouth daily.   escitalopram (LEXAPRO) 5 MG  tablet Take 5 mg by mouth daily.   Multiple Vitamin (MULTIVITAMIN) tablet Take 1 tablet by mouth daily.   propranolol (INDERAL) 10 MG tablet Take 1 tablet (10 mg total) by mouth 2 (two) times daily.   tolterodine (DETROL) 1 MG tablet Take 1 tablet (1 mg total) by mouth 2 (two) times daily. (Patient not taking: Reported on 07/10/2022)   TURMERIC PO Take 1 capsule by mouth daily.   No facility-administered encounter medications on file as of 08/21/2022.    Allergies (verified) Bupropion, Doxepin hcl, Duloxetine hcl, Hydroxyzine, Levofloxacin, Mirtazapine, Nitrofurantoin, Serotonin reuptake inhibitors (ssris), Temazepam, and Zolpidem tartrate   History: Past Medical History:  Diagnosis Date   Chronic fatigue fibromyalgia syndrome    CKD (chronic kidney disease)    Depression    Insomnia    Osteoarthritis    neck   Osteoporosis    Past Surgical History:  Procedure Laterality Date   BUNIONECTOMY     HERNIA REPAIR     TONSILLECTOMY     Family History  Problem Relation Age of Onset   Breast cancer Mother    Suicidality Father    Arthritis Father    Depression Father    Breast cancer Sister    Social History   Socioeconomic History   Marital status: Married    Spouse name: Not on file   Number of children: 2   Years of education: HS   Highest education level: Not on file  Occupational History  Occupation: Retired  Tobacco Use   Smoking status: Never   Smokeless tobacco: Never  Vaping Use   Vaping Use: Never used  Substance and Sexual Activity   Alcohol use: Yes    Comment: May have one glass of wine   Drug use: No   Sexual activity: Not on file  Other Topics Concern   Not on file  Social History Narrative   Lives at home with her husband.   Right-handed.   1-2 cups caffeine per day.   Social Determinants of Health   Financial Resource Strain: Low Risk  (08/21/2022)   Overall Financial Resource Strain (CARDIA)    Difficulty of Paying Living Expenses: Not hard  at all  Food Insecurity: No Food Insecurity (08/21/2022)   Hunger Vital Sign    Worried About Running Out of Food in the Last Year: Never true    Ran Out of Food in the Last Year: Never true  Transportation Needs: No Transportation Needs (08/21/2022)   PRAPARE - Hydrologist (Medical): No    Lack of Transportation (Non-Medical): No  Physical Activity: Inactive (08/21/2022)   Exercise Vital Sign    Days of Exercise per Week: 0 days    Minutes of Exercise per Session: 0 min  Stress: No Stress Concern Present (08/21/2022)   [place]    Feeling of Stress : Not at all  Recent Concern: Stress - Stress Concern Present (05/29/2022)   [place]    Feeling of Stress : Rather much  Social Connections: Moderately Isolated (08/21/2022)   Social Connection and Isolation Panel [NHANES]    Frequency of Communication with Friends and Family: More than three times a week    Frequency of Social Gatherings with Friends and Family: More than three times a week    Attends Religious Services: Never    Marine scientist or Organizations: No    Attends Music therapist: Never    Marital Status: Married    Tobacco Counseling Counseling given: Not Answered   Clinical Intake:  Pre-visit preparation completed: Yes  Pain : No/denies pain     Nutritional Risks: None Diabetes: No  How often do you need to have someone help you when you read instructions, pamphlets, or other written materials from your doctor or pharmacy?: 1 - Never  Diabetic? No  Interpreter Needed?: No  Information entered by :: Rolene Arbour LPN   Activities of Daily Living    08/21/2022    3:42 PM  In your present state of health, do you have any difficulty performing the following activities:  Hearing? 0  Vision? 0  Difficulty concentrating or  making decisions? 0  Walking or climbing stairs? 0  Dressing or bathing? 0  Doing errands, shopping? 0  Preparing Food and eating ? N  Using the Toilet? N  In the past six months, have you accidently leaked urine? N  Do you have problems with loss of bowel control? N  Managing your Medications? N  Managing your Finances? N  Housekeeping or managing your Housekeeping? N    Patient Care Team: Farrel Conners, MD as PCP - General (Family Medicine) Lapp, Marny Lowenstein as Physician Assistant (Internal Medicine)  Indicate any recent Medical Services you may have received from other than [facility] providers in the past year (date may be approximate).     Assessment:   This is  a routine wellness examination for Ovi.  Hearing/Vision screen Hearing Screening - Comments:: Denies hearing difficulties   Vision Screening - Comments:: Wears rx glasses - up to date with routine eye exams with  DR Herbert Deaner  Dietary issues and exercise activities discussed: Exercise limited by: None identified   Goals Addressed               This Visit's Progress     No Current goals (pt-stated)         Depression Screen    08/21/2022    3:41 PM 06/07/2022   10:31 AM 03/05/2014    1:25 PM  PHQ 2/9 Scores  PHQ - 2 Score 0 0 0  PHQ- 9 Score  6     Fall Risk    08/21/2022    3:42 PM 06/07/2022   10:30 AM 03/05/2014    1:25 PM  Fall Risk   Falls in the past year? 0 0 No  Number falls in past yr: 0 0   Injury with Fall? 0 0   Risk for fall due to : No Fall Risks No Fall Risks   Follow up Falls prevention discussed Falls evaluation completed     [place]:  Any stairs in or around the home? Yes  If so, are there any without handrails? No  Home free of loose throw rugs in walkways, pet beds, electrical cords, etc? Yes  Adequate lighting in your home to reduce risk of falls? Yes   ASSISTIVE DEVICES UTILIZED TO PREVENT FALLS:  Life alert? No  Use of a cane, walker  or w/c? No  Grab bars in the bathroom? No  Shower chair or bench in shower? Yes Elevated toilet seat or a handicapped toilet? No   TIMED UP AND GO:  Was the test performed? No . Audio Visit   Cognitive Function:        08/21/2022    3:43 PM  6CIT Screen  What Year? 0 points  What month? 0 points  What time? 0 points  Count back from 20 0 points  Months in reverse 0 points  Repeat phrase 0 points  Total Score 0 points    Immunizations Immunization History  Administered Date(s) Administered   Influenza Split 04/04/2012   Influenza Whole 07/03/2005, 04/07/2008, 05/07/2009, 03/17/2010   Influenza, High Dose Seasonal PF 04/06/2015   Influenza,inj,Quad PF,6+ Mos 04/16/2013, 04/22/2014   Influenza-Unspecified 05/24/2022   Moderna Sars-Covid-2 Vaccination 07/16/2019, 08/13/2019, 06/09/2020   Pneumococcal Conjugate-13 03/05/2014   Pneumococcal Polysaccharide-23 05/03/2006   Td 07/03/2004      Flu Vaccine status: Up to date  Pneumococcal vaccine status: Up to date  Covid-19 vaccine status: Completed vaccines  Qualifies for Shingles Vaccine? Yes   Zostavax completed No   Shingrix Completed?: No.    Education has been provided regarding the importance of this vaccine. Patient has been advised to call insurance company to determine out of pocket expense if they have not yet received this vaccine. Advised may also receive vaccine at local pharmacy or Health Dept. Verbalized acceptance and understanding.  Screening Tests Health Maintenance  Topic Date Due   DTaP/Tdap/Td (2 - Tdap) 07/03/2014   COVID-19 Vaccine (4 - 2023-24 season) 09/06/2022 (Originally 03/03/2022)   Zoster Vaccines- Shingrix (1 of 2) 11/19/2022 (Originally 01/31/1991)   Medicare Annual Wellness (AWV)  08/22/2023   Pneumonia Vaccine 64+ Years old  Completed   INFLUENZA VACCINE  Completed   DEXA SCAN  Completed  HPV VACCINES  Aged Out    Health Maintenance  Health Maintenance Due  Topic Date Due    DTaP/Tdap/Td (2 - Tdap) 07/03/2014    Colorectal cancer screening: No longer required.   Mammogram status: No longer required due to Age.  Bone Density status: Completed 09/09/18. Results reflect: Bone density results: OSTEOPOROSIS. Repeat every   years.  Lung Cancer Screening: (Low Dose CT Chest recommended if Age 22-80 years, 30 pack-year currently smoking OR have quit w/in 15years.)  qualify.     Additional Screening:  Hepatitis C Screening: does not qualify; Completed   Vision Screening: Recommended annual ophthalmology exams for early detection of glaucoma and other disorders of the eye. Is the patient up to date with their annual eye exam?  Yes  Who is the provider or what is the name of the office in which the patient attends annual eye exams? Dr Herbert Deaner If pt is not established with a provider, would they like to be referred to a provider to establish care? No .   Dental Screening: Recommended annual dental exams for proper oral hygiene  Community Resource Referral / Chronic Care Management:  CRR required this visit?  No   CCM required this visit?  No      Plan:     I have personally reviewed and noted the following in the patient's chart:   Medical and social history Use of alcohol, tobacco or illicit drugs  Current medications and supplements including opioid prescriptions. Patient is not currently taking opioid prescriptions. Functional ability and status Nutritional status Physical activity Advanced directives List of other physicians Hospitalizations, surgeries, and ER visits in previous 12 months Vitals Screenings to include cognitive, depression, and falls Referrals and appointments  In addition, I have reviewed and discussed with patient certain preventive protocols, quality metrics, and best practice recommendations. A written personalized care plan for preventive services as well as general preventive health recommendations were provided to patient.      Criselda Peaches, LPN   [id number]   Nurse Notes: None

## 2022-09-05 ENCOUNTER — Ambulatory Visit: Payer: Medicare HMO | Admitting: Cardiovascular Disease

## 2022-09-11 ENCOUNTER — Ambulatory Visit: Payer: Medicare HMO | Admitting: Cardiovascular Disease

## 2022-09-14 ENCOUNTER — Institutional Professional Consult (permissible substitution): Payer: Medicare HMO | Admitting: Internal Medicine

## 2022-09-15 ENCOUNTER — Ambulatory Visit: Payer: Medicare HMO | Attending: Internal Medicine | Admitting: Cardiology

## 2022-09-15 ENCOUNTER — Encounter: Payer: Self-pay | Admitting: Cardiology

## 2022-09-15 VITALS — BP 98/58 | HR 63 | Ht 64.0 in | Wt 157.0 lb

## 2022-09-15 DIAGNOSIS — I493 Ventricular premature depolarization: Secondary | ICD-10-CM

## 2022-09-15 MED ORDER — FLECAINIDE ACETATE 50 MG PO TABS: 50.0000 mg | ORAL_TABLET | Freq: Two times a day (BID) | ORAL | 3 refills | Status: DC

## 2022-09-15 NOTE — Progress Notes (Signed)
Electrophysiology Office Note   Date:  09/15/2022   ID:  Dawn Harmon, Dawn Harmon 26-Apr-1941, MRN [id number]  PCP:  Farrel Conners, MD  Cardiologist:  [facility] Primary Electrophysiologist:  Akif Weldy Meredith Leeds, MD    Chief Complaint: PVC   History of Present Illness: Dawn Harmon is a 82 y.o. female who is being seen today for the evaluation of PVC at the request of Croitoru, Mihai, MD. Presenting today for electrophysiology evaluation.  She has a history significant for CKD and chronic fatigue syndrome.  She presented to cardiology clinic complaining of palpitations and was found to have PVCs.  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  During the hospitalization, she had frequent PVCs and burst of nonsustained VT.  These were asymptomatic.  She wore a cardiac monitor that showed a PVC burden anywhere from 6 to 15%.  She had a recent echo that showed a normal ejection fraction.  Today, she denies symptoms of palpitations, chest pain, shortness of breath, orthopnea, PND, lower extremity edema, claudication, dizziness, presyncope, syncope, bleeding, or neurologic sequela. The patient is tolerating medications without difficulties.  Complaint today is overwhelming fatigue.  She feels that she is not able to do any of her daily activities due to her level of fatigue.  She was started on propranolol, and notes that her fatigue worsened after this medication.   Past Medical History:  Diagnosis Date   Chronic fatigue fibromyalgia syndrome    CKD (chronic kidney disease)    Depression    Insomnia    Osteoarthritis    neck   Osteoporosis    Past Surgical History:  Procedure Laterality Date   BUNIONECTOMY     HERNIA REPAIR     TONSILLECTOMY       Current Outpatient Medications  Medication Sig Dispense Refill    ASHWAGANDHA PO Take 1 Capful by mouth daily at 12 noon.     aspirin EC 81 MG tablet Take 1 tablet (81 mg total) by mouth daily. Swallow whole.     Cranberry 300 MG tablet Take 300 mg by mouth daily.     escitalopram (LEXAPRO) 5 MG tablet Take 5 mg by mouth daily.     flecainide (TAMBOCOR) 50 MG tablet Take 1 tablet (50 mg total) by mouth 2 (two) times daily. 60 tablet 3   Multiple Vitamin (MULTIVITAMIN) tablet Take 1 tablet by mouth daily.     TURMERIC PO Take 1 capsule by mouth daily.     tolterodine (DETROL) 1 MG tablet Take 1 tablet (1 mg total) by mouth 2 (two) times daily. (Patient not taking: Reported on 09/15/2022) 60 tablet 0   No current facility-administered medications for this visit.    Allergies:   Bupropion, Doxepin hcl, Duloxetine hcl, Hydroxyzine, Levofloxacin, Mirtazapine, Nitrofurantoin, Serotonin reuptake inhibitors (ssris), Temazepam, and Zolpidem tartrate   Social History:  The patient  reports that she has never smoked. She has never used smokeless tobacco. She reports current alcohol use. She reports that she does not use drugs.   Family History:  The patient's family history includes Arthritis in her father; Breast cancer in her mother and sister; Depression in her father; Suicidality in her father.    ROS:  Please see the history of present illness.   Otherwise, review of systems is positive for none.   All other systems  are reviewed and negative.    PHYSICAL EXAM: VS:  BP (!) 98/58   Pulse 63   Ht 5\' 4"  (1.626 m)   Wt 157 lb (71.2 kg)   SpO2 99%   BMI 26.95 kg/m  , BMI Body mass index is 26.95 kg/m. GEN: Well nourished, well developed, in no acute distress  HEENT: normal  Neck: no JVD, carotid bruits, or masses Cardiac: RRR; no murmurs, rubs, or gallops,no edema  Respiratory:  clear to auscultation bilaterally, normal work of breathing GI: soft, nontender, nondistended, + BS MS: no deformity or atrophy  Skin: warm and dry Neuro:  Strength and sensation  are intact Psych: euthymic mood, full affect  EKG:  EKG is ordered today. Personal review of the ekg ordered shows sinus rhythm, PVCs  Recent Labs: 05/12/2022: Hemoglobin 14.9; Platelets 339 05/13/2022: TSH 1.741 05/14/2022: BUN 13; Creatinine, Ser 1.18; Magnesium 2.2; Potassium 3.9; Sodium 138    Lipid Panel     Component Value Date/Time   CHOL 224 (H) 05/13/2022 1620   TRIG 74 05/13/2022 1620   TRIG 64 04/12/2006 1122   HDL 77 05/13/2022 1620   CHOLHDL 2.9 05/13/2022 1620   VLDL 15 05/13/2022 1620   LDLCALC 132 (H) 05/13/2022 1620   LDLDIRECT 128.8 02/28/2011 1011     Wt Readings from Last 3 Encounters:  09/15/22 157 lb (71.2 kg)  08/21/22 155 lb (70.3 kg)  07/10/22 155 lb (70.3 kg)      Other studies Reviewed: Additional studies/ records that were reviewed today include: TTE 05/14/22  Review of the above records today demonstrates:   1. Left ventricular ejection fraction, by estimation, is 60 to 65%. Left  ventricular ejection fraction by 2D MOD biplane is 64.0 %. The left  ventricle has normal function. The left ventricle has no regional wall  motion abnormalities. Left ventricular  diastolic parameters are consistent with Grade Dawn diastolic dysfunction  (impaired relaxation).   2. Right ventricular systolic function is normal. The right ventricular  size is normal. There is normal pulmonary artery systolic pressure. The  estimated right ventricular systolic pressure is 123456 mmHg.   3. The mitral valve is abnormal. Mild mitral valve regurgitation.   4. The aortic valve is tricuspid. Aortic valve regurgitation is not  visualized. Aortic valve sclerosis is present, with no evidence of aortic  valve stenosis.   5. The inferior vena cava is dilated in size with >50% respiratory  variability, suggesting right atrial pressure of 8 mmHg.   6. Rhythm strip during this exam demonstrates NSVT.   Cardiac monitor 08/03/2022 personally reviewed   Baseline rhythm is normal  sinus rhythm with normal circadian variation.   There are extremely frequent premature ventricular contractions (almost 20% of all recorded beats) as well as very frequent but very brief episodes of nonsustained ventricular tachycardia, lasting up to 11.5 seconds in duration.  No sustained ventricular tachycardia.  There is a marked association between time of day and activity level and the occurrence of ventricular tachycardia.  During expected sleep hours there is almost complete abolition of ventricular tachycardia and during physical activity there is marked increase in the burden of ventricular arrhythmia.   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  There is no of atrial fibrillation  ASSESSMENT AND PLAN:  1.  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  She has tried taking metoprolol but did not tolerate the medication.  Fortunately  her ejection fraction has remained normal.  She feels quite a bit of fatigue that worsened since starting her propranolol.  Dawn Harmon stop propranolol today and start flecainide 50 mg.  Dawn Harmon discuss this further with her primary cardiologist.  2.  CVA: Has recovered without deficits.  14-day monitor showed no evidence of atrial fibrillation.  Thought due to small vessel disease.    Current medicines are reviewed at length with the patient today.   The patient does not have concerns regarding her medicines.  The following changes were made today: Start flecainide  Labs/ tests ordered today include:  Orders Placed This Encounter  Procedures   EKG 12-Lead     Disposition:   FU with Dawn Harmon 3 months  Signed, Dawn Harmon Meredith Leeds, MD  09/15/2022 12:11 PM     [facility] [address] [place] [address] [phone] (office) [phone] (fax)

## 2022-09-15 NOTE — Patient Instructions (Addendum)
Medication Instructions:  Your physician has recommended you make the following change in your medication:  STOP Propranolol START Flecainide 50 mg twice a day  *If you need a refill on your cardiac medications before your next appointment, please call your pharmacy*   Lab Work: None ordered   Testing/Procedures: None ordered   Follow-Up: At [facility], you and your health needs are our priority.  As part of our continuing mission to provide you with exceptional heart care, we have created designated Provider Care Teams.  These Care Teams include your primary Cardiologist (physician) and Advanced Practice Providers (APPs -  Physician Assistants and Nurse Practitioners) who all work together to provide you with the care you need, when you need it.  Your physician recommends that you schedule a follow-up appointment in: 2 weeks for nurse visit EKG  Your next appointment:   3 month(s)  The format for your next appointment:   In Person  Provider:   Allegra Lai, MD    Thank you for choosing [facility]!!   Trinidad Curet, RN [phone]  Other Instructions   Flecainide Tablets What is this medication? FLECAINIDE (FLEK a nide) prevents and treats a fast or irregular heartbeat (arrhythmia). It is often used to treat a type of arrhythmia known as AFib (atrial fibrillation). It works by slowing down overactive electric signals in the heart, which stabilizes your heart rhythm. It belongs to a group of medications called antiarrhythmics. This medicine may be used for other purposes; ask your health care provider or pharmacist if you have questions. COMMON BRAND NAME(S): Tambocor What should I tell my care team before I take this medication? They need to know if you have any of these conditions: High or low levels of potassium in the blood Heart disease including heart rhythm and heart rate problems Kidney disease Liver disease Recent heart attack An unusual or  allergic reaction to flecainide, other medications, foods, dyes, or preservatives Pregnant or trying to get pregnant Breastfeeding How should I use this medication? Take this medication by mouth with a glass of water. Take it as directed on the prescription label at the same time every day. You can take it with or without food. If it upsets your stomach, take it with food. Do not take your medication more often than directed. Do not stop taking this medication suddenly. This may cause serious, heart-related side effects. If your care team wants you to stop the medication, the dose may be slowly lowered over time to avoid any side effects. Talk to your care team about the use of this medication in children. While it may be prescribed for children as young as 1 year for selected conditions, precautions do apply. Overdosage: If you think you have taken too much of this medicine contact a poison control center or emergency room at once. NOTE: This medicine is only for you. Do not share this medicine with others. What if I miss a dose? If you miss a dose, take it as soon as you can. If it is almost time for your next dose, take only that dose. Do not take double or extra doses. What may interact with this medication? Do not take this medication with any of the following: Amoxapine Arsenic trioxide Certain antibiotics, such as clarithromycin, erythromycin, gatifloxacin, gemifloxacin, levofloxacin, moxifloxacin, sparfloxacin, or troleandomycin Certain antidepressants, called tricyclic antidepressants such as amitriptyline, imipramine, or nortriptyline Certain medications for irregular heartbeat, such as disopyramide, encainide, moricizine, procainamide, propafenone, and quinidine Cisapride Delavirdine Droperidol Haloperidol  [place] Imatinib Levomethadyl Maprotiline Medications for malaria, such as chloroquine and halofantrine Pentamidine Phenothiazines, such as chlorpromazine, mesoridazine,  prochlorperazine, thioridazine Pimozide Quinine Ranolazine Ritonavir Sertindole This medication may also interact with the following: Cimetidine Dofetilide Medications for angina or blood pressure Medications for irregular heartbeat, such as amiodarone and digoxin Ziprasidone This list may not describe all possible interactions. Give your health care provider a list of all the medicines, herbs, non-prescription drugs, or dietary supplements you use. Also tell them if you smoke, drink alcohol, or use illegal drugs. Some items may interact with your medicine. What should I watch for while using this medication? Visit your care team for regular checks on your progress. Because your condition and the use of this medication carries some risk, it is a good idea to carry an identification card, necklace, or bracelet with details of your condition, medications, and care team. Check your blood pressure and pulse rate as directed. Know what your blood pressure and pulse rate should be and when tod contact your care team. Your care team may schedule regular blood tests and electrocardiograms to check your progress. This medication may affect your coordination, reaction time, or judgment. Do not drive or operate machinery until you know how this medication affects you. Sit up or stand slowly to reduce the risk of dizzy or fainting spells. Drinking alcohol with this medication can increase the risk of these side effects. What side effects may I notice from receiving this medication? Side effects that you should report to your care team as soon as possible: Allergic reactions--skin rash, itching, hives, swelling of the face, lips, tongue, or throat Heart failure--shortness of breath, swelling of the ankles, feet, or hands, sudden weight gain, unusual weakness or fatigue Heart rhythm changes--fast or irregular heartbeat, dizziness, feeling faint or lightheaded, chest pain, trouble breathing Liver  injury--right upper belly pain, loss of appetite, nausea, light-colored stool, dark yellow or brown urine, yellowing skin or eyes, unusual weakness or fatigue Side effects that usually do not require medical attention (report to your care team if they continue or are bothersome): Blurry vision Constipation Dizziness Fatigue Headache Nausea Tremors or shaking This list may not describe all possible side effects. Call your doctor for medical advice about side effects. You may report side effects to FDA at 1-800-FDA-1088. Where should I keep my medication? Keep out of the reach of children and pets. Store at room temperature between 15 and 30 degrees C (59 and 86 degrees F). Protect from light. Keep container tightly closed. Throw away any unused medication after the expiration date. NOTE: This sheet is a summary. It may not cover all possible information. If you have questions about this medicine, talk to your doctor, pharmacist, or health care provider.  2023 Elsevier/Gold Standard (2020-08-13 00:00:00)

## 2022-09-18 ENCOUNTER — Telehealth: Payer: Self-pay | Admitting: Family Medicine

## 2022-09-18 NOTE — Telephone Encounter (Signed)
Pt is calling and would like a refill on escitalopram (LEXAPRO) 5 MG tablet . Pt has an appt on 10-19-2022  [facility] [address] [place] Phone: [phone]  Fax: [phone]

## 2022-09-19 MED ORDER — ESCITALOPRAM OXALATE 5 MG PO TABS: 5.0000 mg | ORAL_TABLET | Freq: Every day | ORAL | 0 refills | Status: DC

## 2022-09-19 NOTE — Telephone Encounter (Signed)
Ok to refill 

## 2022-09-19 NOTE — Telephone Encounter (Signed)
Rx done. 

## 2022-09-29 ENCOUNTER — Ambulatory Visit: Payer: Medicare HMO | Attending: Internal Medicine

## 2022-09-29 VITALS — BP 110/70 | HR 67 | Ht 64.0 in | Wt 157.4 lb

## 2022-09-29 DIAGNOSIS — I493 Ventricular premature depolarization: Secondary | ICD-10-CM

## 2022-09-29 NOTE — Progress Notes (Unsigned)
   Nurse Visit   Date of Encounter: 09/29/2022 ID: Dawn Harmon, DOB Mar 03, 1941, MRN [id number]  PCP:  Farrel Conners, [place] Providers Cardiologist:  None Electrophysiologist:  Will Meredith Leeds, MD { Click to update primary MD,subspecialty MD or APP then REFRESH:1}     Visit Details   VS:  BP 110/70   Pulse 67   Ht 5\' 4"  (1.626 m)   Wt 157 lb 6.4 oz (71.4 kg)   SpO2 98%   BMI 27.02 kg/m  , BMI Body mass index is 27.02 kg/m.  Wt Readings from Last 3 Encounters:  09/29/22 157 lb 6.4 oz (71.4 kg)  09/15/22 157 lb (71.2 kg)  08/21/22 155 lb (70.3 kg)     Reason for visit: Started on flecainide 50 mg BID 09/15/22 Performed today: Vitals, EKG, Provider consulted:Dr. Lovena Le, and Education Changes (medications, testing, etc.) : Stop flecainide 50 mg BID Length of Visit: 45 minutes    Medications Adjustments/Labs and Tests Ordered: Flecainide 50 mg BID stopped. Advised patient that QRS was prolonged and Dr. Macky Lower team would reach out to discuss other options to control PVC's such as a different medication.   Signed, Joni Reining, RN  09/29/2022 11:29 AM

## 2022-09-29 NOTE — Patient Instructions (Signed)
Medication Instructions:  STOP taking Flecainide 50 mg BID.  *If you need a refill on your cardiac medications before your next appointment, please call your pharmacy*   Lab Work: None. If you have labs (blood work) drawn today and your tests are completely normal, you will receive your results only by: [place] (if you have MyChart) OR A paper copy in the mail If you have any lab test that is abnormal or we need to change your treatment, we will call you to review the results.   Testing/Procedures: None.   Follow-Up:  Your next appointment:  Dr. Macky Lower team will call you to schedule any follow up you need or advise you of any new medications they recommend.

## 2022-10-12 ENCOUNTER — Telehealth: Payer: Self-pay | Admitting: *Deleted

## 2022-10-12 ENCOUNTER — Other Ambulatory Visit: Payer: Self-pay | Admitting: Cardiology

## 2022-10-12 MED ORDER — MEXILETINE HCL 250 MG PO CAPS: 250.0000 mg | ORAL_CAPSULE | Freq: Two times a day (BID) | ORAL | 3 refills | Status: DC

## 2022-10-12 NOTE — Telephone Encounter (Signed)
Pharmacy comment: Alternative Requested:NON FORMULARY PER INSURANCE. Pt's pharmacy is requesting a different medication. This medication might need a prior auth. Please address

## 2022-10-12 NOTE — Telephone Encounter (Signed)
-----   Message from Will Jorja Loa, MD sent at 10/04/2022  5:03 PM EDT ----- On mexiletine 250 mg twice daily ----- Message ----- From: Luellen Pucker, RN Sent: 09/29/2022  11:34 AM EDT To: Regan Lemming, MD; Baird Lyons, RN; #  Blinda Leatherwood,  Patient came in for nurse visit today 09/29/22, Dr. Ladona Ridgel reviewed EKG and stopped flecainide.  Alcario Drought, RN

## 2022-10-12 NOTE — Telephone Encounter (Signed)
Informed patient of results and verbal understanding expressed. Aware I will send this advisement via mychart and will send information on the new medicine he recommended. Advised to call office if symptoms occur after new medication start. Patient verbalized understanding and agreeable to plan.   Advised to keep follow up scheduled in June.

## 2022-10-12 NOTE — Telephone Encounter (Signed)
Pt aware will need to try for prior auth/tier exception. Aware forwarding to [facility] to address/help with this.

## 2022-10-17 ENCOUNTER — Telehealth: Payer: Self-pay

## 2022-10-17 NOTE — Telephone Encounter (Signed)
**Note De-Identified Dawn Harmon Obfuscation** Mexiletine PA started through covermymeds. Key: [id number]

## 2022-10-17 NOTE — Telephone Encounter (Signed)
**Note De-Identified Oakley Kossman Obfuscation** Dawn Harmon (Key: [id number]) PA Case ID #: [id number] Outcome Approved today Your request has been approved Authorization Expiration Date: 07/03/2023 Drug Mexiletine HCl  capsules ePA cloud logo Form [employer] Electronic PA Form [phone] NCPDP)

## 2022-10-17 NOTE — Telephone Encounter (Signed)
**Note De-Identified Willodene Stallings Obfuscation** Addysen Eschete (Key: [id number]) PA Case ID #: [id number] Outcome Approved today Your request has been approved Authorization Expiration Date: 07/03/2023 Drug Mexiletine HCl  capsules ePA cloud logo Form [employer] Electronic PA Form [phone] NCPDP)  I have notified [id number] - [place], [place] - [address] AT [facility] [address] (Ph: [phone]) of this approval.

## 2022-10-19 ENCOUNTER — Encounter: Payer: Self-pay | Admitting: Family Medicine

## 2022-10-19 ENCOUNTER — Ambulatory Visit (INDEPENDENT_AMBULATORY_CARE_PROVIDER_SITE_OTHER): Payer: Medicare HMO | Admitting: Family Medicine

## 2022-10-19 VITALS — BP 100/62 | HR 73 | Temp 98.2°F | Ht 64.0 in | Wt 155.9 lb

## 2022-10-19 DIAGNOSIS — Z780 Asymptomatic menopausal state: Secondary | ICD-10-CM | POA: Diagnosis not present

## 2022-10-19 DIAGNOSIS — G9332 Myalgic encephalomyelitis/chronic fatigue syndrome: Secondary | ICD-10-CM

## 2022-10-19 NOTE — Patient Instructions (Addendum)
Look at Lyrica or amitriptyline -- make sure the website is from a reputable university like [employer] or [place] [facility]

## 2022-10-19 NOTE — Assessment & Plan Note (Addendum)
I reviewed again the medications she has tried in the past. We discussed lyrica or possibly amitriptyline for treatment. She states she will read about these medications and if she decides she wants to try one she will send me a message. I spent 20 minutes with the patient discussing her symptoms, medications, therapy, etc.

## 2022-10-19 NOTE — Progress Notes (Signed)
Established Patient Office Visit  Subjective   Patient ID: Dawn Harmon, female    DOB: [birth year]  Age: 82 y.o. MRN: [id number]  Chief Complaint  Patient presents with   Medical Management of Chronic Issues    Patient is reporting she still is unable to sleep at night, states that she thinks it is progressively getting worse, states she feels sick, unstable and weak. She reports trouble falling asleep, also wakes up in the middle of the night, also states that she wakes up too early. Has been on trazodone, lunesta and ambien in the past, states that the trazodone didn't work at all. States she only took the [place] as needed previously. States she didn't like the medication, states it made her very groggy after taking it. Patient has not seen the pulmonologist, states that the sleep study is too expensive for her to have.   Pt reports that last week she did experience an acute pain in the left arm, states that it was on the upper arm, it was sore to the touch at the time, she did not feel any lumps and there was no redness on the skin. States that for the next 2 days she was very sore, felt like she had the flu.    Current Outpatient Medications  Medication Instructions   ASHWAGANDHA PO 1 Capful, Oral, Daily   aspirin EC 81 mg, Oral, Daily, Swallow whole.   Cranberry 300 mg, Oral, Daily   escitalopram (LEXAPRO) 5 mg, Oral, Daily   escitalopram (LEXAPRO) 5 mg, Oral, Daily   mexiletine (MEXITIL) 250 mg, Oral, 2 times daily   Multiple Vitamin (MULTIVITAMIN) tablet 1 tablet, Oral, Daily   Multiple Vitamins-Minerals (HAIR SKIN & NAILS PO) Oral, Daily   Probiotic Product (PROBIOTIC PO) Oral, Daily   tolterodine (DETROL) 1 mg, Oral, 2 times daily   TURMERIC PO 1 capsule, Oral, Daily    Patient Active Problem List   Diagnosis Date Noted   Acute CVA (cerebrovascular accident) 05/13/2022   Age-related osteoporosis without current pathological fracture 06/06/2021   Chronic kidney disease,  stage 3 unspecified 06/06/2021   Hyperlipidemia 06/06/2021   Hypoglycemia 06/06/2021   Insomnia 06/06/2021   Major depressive disorder, single episode, unspecified 06/06/2021   Moderate recurrent major depression 06/06/2021   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   Balance disorder 02/19/2017   Chronic neck pain 02/19/2017   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 06/01/2015   Dermatochalasis of both upper eyelids 06/01/2015   Dry eye syndrome, bilateral 06/01/2015   Epiretinal membrane (ERM) of left eye 06/01/2015   Hyperopia of both eyes with astigmatism and presbyopia 06/01/2015   Nuclear sclerosis, left 06/01/2015   Posterior vitreous detachment of right eye 06/01/2015   Squamous blepharitis of upper and lower eyelids of both eyes 06/01/2015   Chronic fatigue syndrome 09/15/2014   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     Objective:     BP 100/62 (BP Location: Left Arm, Patient Position: Sitting, Cuff Size: Normal)   Pulse 73   Temp 98.2 F (36.8 C) (Oral)   Ht  (1.626 m)   Wt 155 lb 14.4 oz (70.7 kg)   SpO2 98%  BMI 26.76 kg/m    Physical Exam Vitals reviewed.  Constitutional:      Appearance: Normal appearance. She is well-groomed and normal weight.  Eyes:     Conjunctiva/sclera: Conjunctivae normal.  Neck:     Thyroid: No thyromegaly.  Cardiovascular:     Rate and Rhythm: Normal rate and regular rhythm.     Pulses: Normal pulses.     Heart sounds: S1 normal and S2 normal.  Pulmonary:     Effort: Pulmonary effort is normal.     Breath sounds: Normal breath sounds and air entry.  Abdominal:     General: Bowel sounds  are normal.  Musculoskeletal:     Right lower leg: No edema.     Left lower leg: No edema.  Neurological:     Mental Status: She is alert and oriented to person, place, and time. Mental status is at baseline.     Gait: Gait is intact.  Psychiatric:        Mood and Affect: Mood and affect normal.        Speech: Speech normal.        Behavior: Behavior normal.        Judgment: Judgment normal.      No results found for any visits on 10/19/22.    The ASCVD Risk score (Arnett DK, et al., 2019) failed to calculate for the following reasons:   The 2019 ASCVD risk score is only valid for ages 28 to 75   The patient has a prior MI or stroke diagnosis    Assessment & Plan:   Problem List Items Addressed This Visit       Unprioritized   Chronic fatigue syndrome - Primary    I reviewed again the medications she has tried in the past. We discussed lyrica or possibly amitriptyline for treatment. She states she will read about these medications and if she decides she wants to try one she will send me a message. I spent 20 minutes with the patient discussing her symptoms, medications, therapy, etc.       Other Visit Diagnoses     Postmenopausal state       Relevant Orders   DG Bone Density       Return in about 6 months (around 04/20/2023).    Karie Georges, MD

## 2022-10-23 ENCOUNTER — Telehealth: Payer: Self-pay | Admitting: Cardiovascular Disease

## 2022-10-23 NOTE — Telephone Encounter (Signed)
Pt c/o medication issue:  1. Name of Medication:  mexiletine (MEXITIL) 250 MG capsule   2. How are you currently taking this medication (dosage and times per day)?  As prescribed, 1 capsules twice daily  3. Are you having a reaction (difficulty breathing--STAT)?   4. What is your medication issue?  Patient states that on Friday this medication hit her like a ton of bricks. After taking it around dinner on 4/19, when it was time for her her stand up, she could barely get herself off the sofa. She states she almost fell multiple times and this also occurred on Saturday and Sunday, but the symptoms eased up.

## 2022-10-23 NOTE — Telephone Encounter (Signed)
Patient states took meds at dinner and when time to go to bed felt like rubber and husband had to help her get to the bed.  States was "non functional and couldn't navigate".  Took it again  the next few doses and still had same symptoms. She almost fell several times, not dizzy like vertigo but just cannot keep her balance. She does say a little better yesterday but unable to do anything in the house and can't go anywhere.  No pain, no swelling, no SOB, just "navigation problems".  Did not take last night before bed since such bad symptoms. She will hold off until receives recommendations from physician

## 2022-10-23 NOTE — Telephone Encounter (Signed)
Pt advised to hold Mexiletine. Will forward to ordering provider for review/advisement. Patient verbalized understanding and agreeable to plan.

## 2022-10-24 NOTE — Telephone Encounter (Signed)
Informed that Dr. Elberta Fortis recommends starting Amiodarone  (take 1 tab BID x 1 month, then take 1 tab daily). Informed that it is showing an interaction with her Lexapro possibly.  Pt understands I am forwarding to pharmD for review/advisement. Informed that I am not in the office tomorrow but will follow up by the end of the week. Patient verbalized understanding and agreeable to plan.

## 2022-10-25 NOTE — Telephone Encounter (Signed)
Both medications can cause QT prolongation.   It is often hard for patients to find an anti-depressant that works for them, so would suggest reviewing with Dr. Girard Cooter on when to check EKG after starting the amiodarone.  If elevates, then may need to find new anti-depressant.

## 2022-10-27 NOTE — Telephone Encounter (Addendum)
Pt aware will follow up Monday to discuss plan of care further. Pt asks that I call back late afternoon as they have several appts Monday.   (Per [place]:  needs EKG 2 weeks after starting Amiodarone)

## 2022-11-01 MED ORDER — AMIODARONE HCL 200 MG PO TABS: ORAL_TABLET | ORAL | 2 refills | Status: DC

## 2022-11-01 NOTE — Telephone Encounter (Signed)
Pt advised to start Amiodarone (1 tab BID x 1 month, then reduce to 1 tab daily). Pt scheduled to come for nurse visit on 5/17 for follow up EKG after starting Amiodarone while taking lexapro. Aware sending her information on Amiodarone via mychart. Patient verbalized understanding and agreeable to plan.

## 2022-11-06 ENCOUNTER — Encounter: Payer: Self-pay | Admitting: Cardiovascular Disease

## 2022-11-06 ENCOUNTER — Telehealth: Payer: Self-pay | Admitting: Cardiology

## 2022-11-06 NOTE — Telephone Encounter (Signed)
Pt calling to me know that she hasn't started the Amiodarone yet. Pt advised to start tonight and still ok for EKG nurse visit next Friday. Patient verbalized understanding and agreeable to plan.

## 2022-11-06 NOTE — Telephone Encounter (Signed)
Pt c/o medication issue:  1. Name of Medication:  amiodarone (PACERONE) 200 MG tablet  2. How are you currently taking this medication (dosage and times per day)?   3. Are you having a reaction (difficulty breathing--STAT)?   4. What is your medication issue?   Patient is requesting to speak with Roanna Raider, RN, regarding Amiodarone. She states she would prefer to speak directly with Sherri, RN, if at all possible.

## 2022-11-06 NOTE — Telephone Encounter (Signed)
Error

## 2022-11-17 ENCOUNTER — Ambulatory Visit: Payer: Medicare HMO | Attending: Internal Medicine | Admitting: *Deleted

## 2022-11-17 VITALS — BP 118/72 | HR 70 | Ht 64.0 in

## 2022-11-17 DIAGNOSIS — I493 Ventricular premature depolarization: Secondary | ICD-10-CM

## 2022-11-17 DIAGNOSIS — Z79899 Other long term (current) drug therapy: Secondary | ICD-10-CM

## 2022-11-17 NOTE — Progress Notes (Signed)
   Nurse Visit   Date of Encounter: 11/17/2022 ID: Dawn Harmon, DOB 1941/06/13, MRN [id number]  PCP:  Karie Georges, MD   [facility] Providers Cardiologist:  None Electrophysiologist:  Will Jorja Loa, MD      Visit Details   VS:  BP 118/72 (BP Location: Left Arm, Cuff Size: Normal)   Pulse 70   Ht 5\' 4"  (1.626 m)   BMI 26.76 kg/m  , BMI Body mass index is 26.76 kg/m.  Wt Readings from Last 3 Encounters:  10/19/22 155 lb 14.4 oz (70.7 kg)  09/29/22 157 lb 6.4 oz (71.4 kg)  09/15/22 157 lb (71.2 kg)     Reason for visit: EKG post Amiodarone start (taking Lexapro) Performed today: Vitals, EKG, and Provider consulted:Dr. Tenny Craw Changes (medications, testing, etc.) : none Length of Visit: 20 minutes  EKG showing NSR. PR 168 ms QRS 86 ms QT/QTc 404/436 ms  Pt aware to keep scheduled follow up next month  Medications Adjustments/Labs and Tests Ordered: No orders of the defined types were placed in this encounter.  No orders of the defined types were placed in this encounter.    Signed, Baird Lyons, RN  11/17/2022 11:56 AM

## 2022-11-28 ENCOUNTER — Telehealth: Payer: Self-pay | Admitting: *Deleted

## 2022-11-28 NOTE — Telephone Encounter (Signed)
Baird Lyons, RN  Terriyah Westra, Annita Brod, RN Pt notified, verbalized understanding/our recommendation       Previous Messages    ----- Message ----- From: Awilda Metro, RPH-CPP Sent: 11/22/2022   8:47 AM EDT To: Baird Lyons, RN Subject: RE: medication question                        Would recommend against OTC supplementation with this product due to lack of efficacy/safety data. ----- Message ----- From: Baird Lyons, RN Sent: 11/21/2022   8:42 AM EDT To: Baird Lyons, RN; C[facility] Div Pharmd Subject: medication question                            Pt seen last week and asking if ok to take the following (for memory issues) w/ her current medication regimen...Marland KitchenMarland KitchenMarland Kitchen  [employer] Organic Lions Mane Mushroom Liquid Extract 0.5 mL BID (I think she said something about 15 gtt)   Thanks Environmental manager

## 2022-11-29 ENCOUNTER — Ambulatory Visit (INDEPENDENT_AMBULATORY_CARE_PROVIDER_SITE_OTHER): Payer: Medicare HMO | Admitting: Nurse Practitioner

## 2022-11-29 ENCOUNTER — Encounter: Payer: Self-pay | Admitting: Nurse Practitioner

## 2022-11-29 VITALS — HR 70 | Temp 97.3°F | Ht 64.0 in | Wt 156.6 lb

## 2022-11-29 DIAGNOSIS — R350 Frequency of micturition: Secondary | ICD-10-CM | POA: Diagnosis not present

## 2022-11-29 DIAGNOSIS — N3000 Acute cystitis without hematuria: Secondary | ICD-10-CM | POA: Diagnosis not present

## 2022-11-29 LAB — POC URINALSYSI DIPSTICK (AUTOMATED)
Bilirubin, UA: POSITIVE
Blood, UA: POSITIVE
Glucose, UA: NEGATIVE
Ketones, UA: NEGATIVE
Nitrite, UA: POSITIVE
Protein, UA: POSITIVE — AB
Spec Grav, UA: 1.015
Urobilinogen, UA: 2 U/dL — AB
pH, UA: 6

## 2022-11-29 MED ORDER — CEPHALEXIN 500 MG PO CAPS: 500.0000 mg | ORAL_CAPSULE | Freq: Two times a day (BID) | ORAL | 0 refills | Status: DC

## 2022-11-29 NOTE — Assessment & Plan Note (Signed)
Start keflex 500mg  BID x 7 days. Encourage fluids. Can continue pyrdium prn. Add on urine culture. Follow-up if not improving.

## 2022-11-29 NOTE — Patient Instructions (Signed)
It was great to see you!  Start keflex twice a day for 7 days. Keep drinking plenty of fluids.   Let's follow-up if your symptoms worsen or don't improve.   Take care,  Rodman Pickle, NP

## 2022-11-29 NOTE — Progress Notes (Signed)
Acute Office Visit  Subjective:     Patient ID: Dawn Harmon, female    DOB: 08/29/1940, 82 y.o.   MRN: [id number]  Chief Complaint  Patient presents with   Urinary Frequency    With spasms since 10:30pm, taken has [facility] Urinary Pain Relief with no relief    HPI Patient is in today for urinary frequency that started yesterday.  URINARY SYMPTOMS  Dysuria: burning Urinary frequency: yes Urgency: yes Small volume voids: yes Symptom severity:  severe Urinary incontinence: no Foul odor: yes Hematuria: no Abdominal pain: no Back pain: no Suprapubic pain/pressure: yes - and spasms with urination  Flank pain: no Fever:  no Vomiting: no Relief with cranberry juice: yes Relief with pyridium: no Status: better/worse/stable Previous urinary tract infection: yes Recurrent urinary tract infection: no Treatments attempted: cranberry and increasing fluids   ROS See pertinent positives and negatives per HPI.     Objective:    Pulse 70   Temp (!) 97.3 F (36.3 C)   Ht 5\' 4"  (1.626 m)   Wt 156 lb 9.6 oz (71 kg)   SpO2 96%   BMI 26.88 kg/m    Physical Exam Vitals and nursing note reviewed.  Constitutional:      General: She is not in acute distress.    Appearance: Normal appearance.  HENT:     Head: Normocephalic.  Eyes:     Conjunctiva/sclera: Conjunctivae normal.  Cardiovascular:     Rate and Rhythm: Normal rate and regular rhythm.     Pulses: Normal pulses.     Heart sounds: Normal heart sounds.  Pulmonary:     Effort: Pulmonary effort is normal.     Breath sounds: Normal breath sounds.  Abdominal:     Palpations: Abdomen is soft.     Tenderness: There is no abdominal tenderness. There is no right CVA tenderness or left CVA tenderness.  Musculoskeletal:     Cervical back: Normal range of motion.  Skin:    General: Skin is warm.  Neurological:     General: No focal deficit present.     Mental Status: She is alert and oriented to person, place, and time.   Psychiatric:        Mood and Affect: Mood normal.        Behavior: Behavior normal.        Thought Content: Thought content normal.        Judgment: Judgment normal.     Results for orders placed or performed in visit on 11/29/22  POCT Urinalysis Dipstick (Automated)  Result Value Ref Range   Color, UA     Clarity, UA     Glucose, UA Negative Negative   Bilirubin, UA Positive    Ketones, UA Negative    Spec Grav, UA 1.015 1.010 - 1.025   Blood, UA Positive    pH, UA 6.0 5.0 - 8.0   Protein, UA Positive (A) Negative   Urobilinogen, UA 2.0 (A) 0.2 or 1.0 E.U./dL   Nitrite, UA positive    Leukocytes, UA Large (3+) (A) Negative        Assessment & Plan:   Problem List Items Addressed This Visit       Genitourinary   Urinary tract infection - Primary    Start keflex 500mg  BID x 7 days. Encourage fluids. Can continue pyrdium prn. Add on urine culture. Follow-up if not improving.       Relevant Medications   cephALEXin (KEFLEX) 500 MG capsule  Other Relevant Orders   Urine Culture   Other Visit Diagnoses     Urine frequency       U/A positive 3+ leukocytes. Will treat for UTI   Relevant Orders   POCT Urinalysis Dipstick (Automated) (Completed)       Meds ordered this encounter  Medications   cephALEXin (KEFLEX) 500 MG capsule    Sig: Take 1 capsule (500 mg total) by mouth 2 (two) times daily.    Dispense:  14 capsule    Refill:  0    Return if symptoms worsen or fail to improve.  Gerre Scull, NP

## 2022-11-30 LAB — URINE CULTURE
MICRO NUMBER:: 15014598
SPECIMEN QUALITY:: ADEQUATE

## 2022-12-02 LAB — URINE CULTURE

## 2022-12-11 ENCOUNTER — Other Ambulatory Visit: Payer: Self-pay | Admitting: Family Medicine

## 2022-12-22 ENCOUNTER — Encounter: Payer: Self-pay | Admitting: Cardiology

## 2022-12-22 ENCOUNTER — Ambulatory Visit: Payer: Medicare HMO | Attending: Cardiology | Admitting: Cardiology

## 2022-12-22 VITALS — BP 120/84 | HR 64 | Ht 64.0 in | Wt 158.0 lb

## 2022-12-22 DIAGNOSIS — I493 Ventricular premature depolarization: Secondary | ICD-10-CM | POA: Diagnosis not present

## 2022-12-22 DIAGNOSIS — I4729 Other ventricular tachycardia: Secondary | ICD-10-CM

## 2022-12-22 DIAGNOSIS — I444 Left anterior fascicular block: Secondary | ICD-10-CM

## 2022-12-22 NOTE — Progress Notes (Signed)
  Electrophysiology Office Note:   Date:  12/22/2022  ID:  Dawn Harmon, DOB 17-Jan-1941, MRN [id number]  Primary Cardiologist: None Electrophysiologist: Laryn Venning Jorja Loa, MD      History of Present Illness:   Dawn Harmon is a 82 y.o. female with h/o CKD, chronic fatigue, PVCs seen today for routine electrophysiology followup.  Since last being seen in our clinic the patient reports doing the same as she did previously.  She is having significant issues with her chronic fatigue.  She is having quite a bit of difficulty sleeping.  She is having difficulty doing much of her daily activity.  She is unclear as to whether or not the amiodarone has made a difference in her level of fatigue.  she denies chest pain, palpitations, dyspnea, PND, orthopnea, nausea, vomiting, dizziness, syncope, edema, weight gain, or early satiety.     He has a history significant for CKD and chronic fatigue.  She presented to cardiology clinic with PVCs.  She wore a cardiac monitor that showed burdens of 6 to 15%.  She is now on amiodarone.     Review of systems complete and found to be negative unless listed in HPI.   Studies Reviewed:    EKG is ordered today. Personal review as below.  EKG Interpretation  Date/Time:  Friday December 22 2022 11:08:57 EDT Ventricular Rate:  64 PR Interval:  176 QRS Duration: 84 QT Interval:  426 QTC Calculation: 439 R Axis:   -34 Text Interpretation: Normal sinus rhythm Left axis deviation Low voltage QRS When compared with ECG of 14-May-2022 02:59, Premature ventricular complexes are no longer Present Confirmed by Francois Elk ([id number]) on 12/22/2022 11:15:38 AM   Risk Assessment/Calculations:              Physical Exam:   VS:  BP 120/84   Pulse 64   Ht 5\' 4"  (1.626 m)   Wt 158 lb (71.7 kg)   SpO2 99%   BMI 27.12 kg/m    Wt Readings from Last 3 Encounters:  12/22/22 158 lb (71.7 kg)  11/29/22 156 lb 9.6 oz (71 kg)  10/19/22 155 lb 14.4 oz (70.7 kg)     GEN: Well  nourished, well developed in no acute distress NECK: No JVD; No carotid bruits CARDIAC: Regular rate and rhythm, no murmurs, rubs, gallops RESPIRATORY:  Clear to auscultation without rales, wheezing or rhonchi  ABDOMEN: Soft, non-tender, non-distended EXTREMITIES:  No edema; No deformity   ASSESSMENT AND PLAN:    1.  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  Currently on amiodarone.  Was previously on flecainide but this was stopped due to QRS prolongation.  She appears to have had suppression of her PVCs, with none on auscultation and none on EKG today.  He is unclear as to whether or not amiodarone is worsening her fatigue.  Dawn Harmon stop it for 2 weeks.  If fatigue improves, Dawn Harmon stop amiodarone.  2.  Cryptogenic stroke: No episodes of atrial fibrillation on 14-day monitor.  Thought due to small vessel disease.  3.  High risk medication monitoring: Currently on amiodarone.  Follow up with Dr. Elberta Fortis in 6 months  Signed, Lilliane Sposito Jorja Loa, MD

## 2022-12-22 NOTE — Patient Instructions (Addendum)
Medication Instructions:  HOLD Amiodarone for several weeks -- let us know how you are doing after holding so we may address whether to restart it vs stop it completely  *If you need a refill on your cardiac medications before your next appointment, please call your pharmacy*   Lab Work: None ordered   Testing/Procedures: None ordered   Follow-Up: At [facility], you and your health needs are our priority.  As part of our continuing mission to provide you with exceptional heart care, we have created designated Provider Care Teams.  These Care Teams include your primary Cardiologist (physician) and Advanced Practice Providers (APPs -  Physician Assistants and Nurse Practitioners) who all work together to provide you with the care you need, when you need it.  Your next appointment:   6 month(s)  The format for your next appointment:   In Person  Provider:   Loman Brooklyn, MD    Thank you for choosing [facility]!!   Dory Horn, RN [phone]

## 2023-01-29 ENCOUNTER — Encounter: Payer: Self-pay | Admitting: Family Medicine

## 2023-01-29 ENCOUNTER — Ambulatory Visit: Payer: Medicare HMO | Admitting: Family Medicine

## 2023-01-29 VITALS — BP 122/64 | HR 65 | Temp 97.7°F | Ht 64.0 in | Wt 155.5 lb

## 2023-01-29 DIAGNOSIS — G9332 Myalgic encephalomyelitis/chronic fatigue syndrome: Secondary | ICD-10-CM | POA: Diagnosis not present

## 2023-01-29 NOTE — Patient Instructions (Signed)
[facility] [address] [place],  [place]  [address] Get Driving Directions Main: [phone]  Fax: [phone]

## 2023-01-29 NOTE — Progress Notes (Signed)
Established Patient Office Visit  Subjective   Patient ID: Dawn Harmon, female    DOB: [birth year]  Age: 82 y.o. MRN: [id number]  Chief Complaint  Patient presents with   Fatigue    Dawn Harmon    Patient is here to discuss her fatigue symptoms. She has been seen for this before in the past. She reports she is taking the lexapro and the ashwaganda. States she was instructed to stop the amiodarone by the cardiologist for the frequent PVC's. She reports she can barely get out of bed now, states that it is getting worse. States that the lexapro does help take the edge off, but the fatigue is debilitating and she is getting weaker and weaker. We had a long discussion about the overall prognosis of chronic fatigue syndrome and different modalities for management of her symptoms. She has seen neurology in the past as well. We discussed lyrica again today and she reports she does not want to take any other medications at this time. She also asked about alternative medicine treatments. We discussed behavioral therapies as well as physical therapy/ referral to physical medicine and she is agreeable to this referral.     Current Outpatient Medications  Medication Instructions   ASHWAGANDHA PO 1 Capful, Oral, Daily   aspirin EC 81 mg, Oral, Daily, Swallow whole.   Cranberry 300 mg, Oral, Daily   Multiple Vitamin (MULTIVITAMIN) tablet 1 tablet, Oral, Daily   Multiple Vitamins-Minerals (HAIR SKIN & NAILS PO) Oral, Daily   OVER THE COUNTER MEDICATION [facility] Urinary Relief   Probiotic Product (PROBIOTIC PO) Oral, Daily   TURMERIC PO 1 capsule, Oral, Daily    Patient Active Problem List   Diagnosis Date Noted   Acute CVA (cerebrovascular accident) (HCC) 05/13/2022   Age-related osteoporosis without current pathological fracture 06/06/2021   Chronic kidney disease, stage 3 unspecified (HCC) 06/06/2021   Hyperlipidemia 06/06/2021   Hypoglycemia 06/06/2021   Insomnia 06/06/2021   Major depressive disorder,  single episode, unspecified 06/06/2021   Moderate recurrent major depression (HCC) 06/06/2021   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   Balance disorder 02/19/2017   Chronic neck pain 02/19/2017   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 06/01/2015   Dermatochalasis of both upper eyelids 06/01/2015   Dry eye syndrome, bilateral 06/01/2015   Epiretinal membrane (ERM) of left eye 06/01/2015   Hyperopia of both eyes with astigmatism and presbyopia 06/01/2015   Nuclear sclerosis, left 06/01/2015   Posterior vitreous detachment of right eye 06/01/2015   Squamous blepharitis of upper and lower eyelids of both eyes 06/01/2015   Chronic fatigue syndrome 09/15/2014   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     Objective:     BP 122/64 (BP Location: Left Arm, Patient Position: Sitting, Cuff Size: Large)   Pulse 65   Temp 97.7 F (36.5 C) (Oral)   Ht 5\' 4"  (1.626 m)   Wt 155 lb 8 oz (70.5 kg)   SpO2 99%   BMI 26.69 kg/m    Physical Exam Vitals reviewed.  Constitutional:      Appearance: Normal appearance. She is normal weight.  Eyes:     Pupils: Pupils are equal, round, and  reactive to light.  Pulmonary:     Effort: Pulmonary effort is normal.  Neurological:     General: No focal deficit present.     Mental Status: She is alert and oriented to person, place, and time. Mental status is at baseline.  Psychiatric:        Mood and Affect: Mood normal.        Behavior: Behavior normal.      No results found for any visits on 01/29/23.    The ASCVD Risk score (Arnett DK, et al., 2019) failed to calculate for the following reasons:   The  2019 ASCVD risk score is only valid for ages 77 to 63   The patient has a prior MI or stroke diagnosis    Assessment & Plan:  Chronic fatigue syndrome Assessment & Plan: Chronic, ongoing, pt is reporting worsening of her symptoms over time, we had a long conversation about treatment modalities since she does not want to start medication. I advised we try either behavioral therapy or physical therapy and she is agreeable to the physical therapy. I will place a referral to the physical medicine physician also to see if there are other modalities that may help. I will see her back in November for follow up.   Orders: -     Ambulatory referral to Physical Medicine Rehab     Return in about 15 weeks (around 05/14/2023) for follow up and annual labs.    Karie Georges, MD

## 2023-01-29 NOTE — Assessment & Plan Note (Signed)
Chronic, ongoing, pt is reporting worsening of her symptoms over time, we had a long conversation about treatment modalities since she does not want to start medication. I advised we try either behavioral therapy or physical therapy and she is agreeable to the physical therapy. I will place a referral to the physical medicine physician also to see if there are other modalities that may help. I will see her back in November for follow up.

## 2023-02-26 ENCOUNTER — Other Ambulatory Visit: Payer: Self-pay | Admitting: Family Medicine

## 2023-03-06 ENCOUNTER — Encounter: Payer: Self-pay | Admitting: Family Medicine

## 2023-03-06 ENCOUNTER — Telehealth: Payer: Self-pay | Admitting: Family Medicine

## 2023-03-06 ENCOUNTER — Ambulatory Visit (INDEPENDENT_AMBULATORY_CARE_PROVIDER_SITE_OTHER): Payer: Medicare HMO | Admitting: Family Medicine

## 2023-03-06 VITALS — BP 120/76 | HR 78 | Wt 155.0 lb

## 2023-03-06 DIAGNOSIS — R300 Dysuria: Secondary | ICD-10-CM

## 2023-03-06 DIAGNOSIS — N390 Urinary tract infection, site not specified: Secondary | ICD-10-CM | POA: Diagnosis not present

## 2023-03-06 LAB — POC URINALSYSI DIPSTICK (AUTOMATED)
Glucose, UA: POSITIVE — AB
Protein, UA: POSITIVE — AB
Spec Grav, UA: 1.01 (ref 1.010–1.025)
Urobilinogen, UA: 2 U/dL — AB
pH, UA: 7.5 (ref 5.0–8.0)

## 2023-03-06 MED ORDER — SULFAMETHOXAZOLE-TRIMETHOPRIM 800-160 MG PO TABS: 1.0000 | ORAL_TABLET | Freq: Two times a day (BID) | ORAL | 0 refills | Status: DC

## 2023-03-06 NOTE — Telephone Encounter (Signed)
Ok for pt to transfer her care to Dr Clent Ridges

## 2023-03-06 NOTE — Telephone Encounter (Signed)
Patient states Dr. Clent Ridges ok'd her to be one of his patients so she and her husband could be with the same Dr.  Is this ok with Dr. Clent Ridges and with Dr. Casimiro Needle?  (For Tammy)- Patient states for me to call her at [phone]

## 2023-03-06 NOTE — Telephone Encounter (Signed)
Left a detailed message at the patient's home number requesting she call for an appt as a visit is needed for evaluation first and the provider will have the urine tested during the visit.

## 2023-03-06 NOTE — Telephone Encounter (Signed)
Pt husband has an appt with dr fry today and she has  an appt with dr fry at 315 pm

## 2023-03-06 NOTE — Progress Notes (Signed)
   Subjective:    Patient ID: Dawn Harmon, female    DOB: 12-24-1940, 82 y.o.   MRN: [id number]  HPI Here for 4 days of urinary pressure and urgency. No fever or nausea or back pain. She has been drinking water and using AZO. She has frequent UTIs, and the last one was in May. Her culture grew a pan-sensitive E coli, and she was treated with Cephalexin.    Review of Systems  Constitutional: Negative.   Respiratory: Negative.    Cardiovascular: Negative.   Gastrointestinal: Negative.   Genitourinary:  Positive for frequency and urgency. Negative for dysuria, flank pain and hematuria.       Objective:   Physical Exam Constitutional:      Appearance: Normal appearance.  Cardiovascular:     Rate and Rhythm: Normal rate and regular rhythm.     Pulses: Normal pulses.     Heart sounds: Normal heart sounds.  Pulmonary:     Effort: Pulmonary effort is normal.     Breath sounds: Normal breath sounds.  Abdominal:     Tenderness: There is no right CVA tenderness or left CVA tenderness.     Comments: Mild suprapubic tenderness   Neurological:     Mental Status: She is alert.           Assessment & Plan:  UTI, treat with 7 days of Bactrim DS. Culture the sample.  Gershon Crane, MD

## 2023-03-06 NOTE — Telephone Encounter (Signed)
Pt husband is calling and per husband pt is  having UTI symptoms and does not want to made an appt would like to drop off urine samples

## 2023-03-07 NOTE — Telephone Encounter (Signed)
Ok with me 

## 2023-03-09 LAB — URINE CULTURE
MICRO NUMBER:: 15413442
SPECIMEN QUALITY:: ADEQUATE

## 2023-03-09 NOTE — Telephone Encounter (Signed)
LVM for pt to call and set up a TOC appt to see Dr. Clent Ridges.

## 2023-03-21 ENCOUNTER — Encounter: Payer: Self-pay | Admitting: Family Medicine

## 2023-03-21 ENCOUNTER — Ambulatory Visit (INDEPENDENT_AMBULATORY_CARE_PROVIDER_SITE_OTHER): Payer: Medicare HMO | Admitting: Family Medicine

## 2023-03-21 VITALS — BP 98/70 | HR 67 | Temp 98.8°F | Wt 157.8 lb

## 2023-03-21 DIAGNOSIS — I493 Ventricular premature depolarization: Secondary | ICD-10-CM | POA: Diagnosis not present

## 2023-03-21 DIAGNOSIS — G9332 Myalgic encephalomyelitis/chronic fatigue syndrome: Secondary | ICD-10-CM

## 2023-03-21 DIAGNOSIS — F331 Major depressive disorder, recurrent, moderate: Secondary | ICD-10-CM

## 2023-03-21 DIAGNOSIS — G4700 Insomnia, unspecified: Secondary | ICD-10-CM | POA: Diagnosis not present

## 2023-03-21 DIAGNOSIS — K582 Mixed irritable bowel syndrome: Secondary | ICD-10-CM

## 2023-03-21 DIAGNOSIS — I4729 Other ventricular tachycardia: Secondary | ICD-10-CM

## 2023-03-21 DIAGNOSIS — N1831 Chronic kidney disease, stage 3a: Secondary | ICD-10-CM

## 2023-03-21 DIAGNOSIS — I639 Cerebral infarction, unspecified: Secondary | ICD-10-CM

## 2023-03-21 MED ORDER — AMPHETAMINE-DEXTROAMPHET ER 5 MG PO CP24: 5.0000 mg | ORAL_CAPSULE | Freq: Every day | ORAL | 0 refills | Status: DC

## 2023-03-21 MED ORDER — ESCITALOPRAM OXALATE 5 MG PO TABS: 5.0000 mg | ORAL_TABLET | Freq: Every day | ORAL | 3 refills | Status: DC

## 2023-03-21 NOTE — Progress Notes (Signed)
Subjective:    Patient ID: Dawn Harmon, female    DOB: June 24, 1941, 82 y.o.   MRN: [id number]  HPI Here to establish with [place] for primary care after transferring from Dr.Michael. I have been seeing her husband for several years. She has a hx of depression and chronic fatigue, and she asks [place] to start her back on Lexapro. She had taken this in the past with good results. She also describes her chronic fatigue at some length. This has been a problem for about 20 years. She says it makes her life miserable and she struggles with this every day. She has seen a number of specialists but no one can find an explanation for it. In fact, many providers told her they did not think this problem actually exists. She does not sleep well, and she has tired numerous things over the years (melatonin, supplements, Temazepam, Zolpidem), and these all either gave her side effects or they simply didn't help. She says her fatigue makes it hard to concentrate and to get anything done during the day. She has a hx of frequent PVC's and non-sustained VT, and she sees Dr. Royann Shivers for cardiology care. She has hx of stroke, and she sees Dr. Marvel Plan for neurologic care. She has no residual deficits from the stroke apparently.    Review of Systems  Constitutional:  Positive for fatigue.  Respiratory: Negative.    Cardiovascular: Negative.   Gastrointestinal: Negative.   Genitourinary: Negative.   Neurological: Negative.   Psychiatric/Behavioral:  Positive for decreased concentration, dysphoric mood and sleep disturbance. Negative for agitation, behavioral problems, confusion, hallucinations, self-injury and suicidal ideas. The patient is nervous/anxious.        Objective:   Physical Exam Constitutional:      Appearance: Normal appearance.  Cardiovascular:     Rate and Rhythm: Normal rate and regular rhythm.     Pulses: Normal pulses.     Heart sounds: Normal heart sounds.  Pulmonary:     Effort: Pulmonary effort  is normal.     Breath sounds: Normal breath sounds.  Neurological:     General: No focal deficit present.     Mental Status: She is alert and oriented to person, place, and time.  Psychiatric:        Behavior: Behavior normal.        Thought Content: Thought content normal.     Comments: Her affect is depressed            Assessment & Plan:  She has chronic depression, so we will start her back on Lexapro 5 mg daily. She also has chronic fatigue syndrome, so we will let her try Adderal XR 5 mg every morning. She will report back in one week. We plan to see her back soon also for a complete exam with fasting labs to follow up her renal function, etc. We spent a total of ( 33  ) minutes reviewing records and discussing these issues.  Gershon Crane, MD

## 2023-03-26 ENCOUNTER — Ambulatory Visit: Payer: Medicare HMO | Admitting: Family Medicine

## 2023-03-28 ENCOUNTER — Encounter: Payer: Medicare HMO | Admitting: Family Medicine

## 2023-03-28 ENCOUNTER — Other Ambulatory Visit: Payer: Self-pay | Admitting: Family Medicine

## 2023-03-28 DIAGNOSIS — Z1231 Encounter for screening mammogram for malignant neoplasm of breast: Secondary | ICD-10-CM

## 2023-04-06 ENCOUNTER — Ambulatory Visit: Payer: Medicare HMO | Admitting: Family Medicine

## 2023-04-10 ENCOUNTER — Encounter: Payer: Self-pay | Admitting: Physical Medicine & Rehabilitation

## 2023-04-24 ENCOUNTER — Ambulatory Visit: Payer: Medicare HMO | Admitting: Family Medicine

## 2023-04-24 ENCOUNTER — Encounter: Payer: Self-pay | Admitting: Family Medicine

## 2023-04-24 VITALS — BP 118/70 | HR 71 | Temp 97.4°F | Ht 63.5 in | Wt 157.0 lb

## 2023-04-24 DIAGNOSIS — G8929 Other chronic pain: Secondary | ICD-10-CM

## 2023-04-24 DIAGNOSIS — G9332 Myalgic encephalomyelitis/chronic fatigue syndrome: Secondary | ICD-10-CM | POA: Diagnosis not present

## 2023-04-24 DIAGNOSIS — N1831 Chronic kidney disease, stage 3a: Secondary | ICD-10-CM | POA: Diagnosis not present

## 2023-04-24 DIAGNOSIS — M810 Age-related osteoporosis without current pathological fracture: Secondary | ICD-10-CM | POA: Diagnosis not present

## 2023-04-24 DIAGNOSIS — M150 Primary generalized (osteo)arthritis: Secondary | ICD-10-CM

## 2023-04-24 DIAGNOSIS — M542 Cervicalgia: Secondary | ICD-10-CM

## 2023-04-24 DIAGNOSIS — R739 Hyperglycemia, unspecified: Secondary | ICD-10-CM | POA: Diagnosis not present

## 2023-04-24 DIAGNOSIS — E559 Vitamin D deficiency, unspecified: Secondary | ICD-10-CM | POA: Diagnosis not present

## 2023-04-24 DIAGNOSIS — Z8673 Personal history of transient ischemic attack (TIA), and cerebral infarction without residual deficits: Secondary | ICD-10-CM | POA: Insufficient documentation

## 2023-04-24 DIAGNOSIS — E538 Deficiency of other specified B group vitamins: Secondary | ICD-10-CM

## 2023-04-24 DIAGNOSIS — F331 Major depressive disorder, recurrent, moderate: Secondary | ICD-10-CM

## 2023-04-24 DIAGNOSIS — G4700 Insomnia, unspecified: Secondary | ICD-10-CM

## 2023-04-24 DIAGNOSIS — K582 Mixed irritable bowel syndrome: Secondary | ICD-10-CM

## 2023-04-24 DIAGNOSIS — I4729 Other ventricular tachycardia: Secondary | ICD-10-CM

## 2023-04-24 DIAGNOSIS — R222 Localized swelling, mass and lump, trunk: Secondary | ICD-10-CM

## 2023-04-24 DIAGNOSIS — R2689 Other abnormalities of gait and mobility: Secondary | ICD-10-CM | POA: Diagnosis not present

## 2023-04-24 DIAGNOSIS — E7800 Pure hypercholesterolemia, unspecified: Secondary | ICD-10-CM

## 2023-04-24 LAB — VITAMIN D 25 HYDROXY (VIT D DEFICIENCY, FRACTURES): VITD: 54.54 ng/mL (ref 30.00–100.00)

## 2023-04-24 LAB — BASIC METABOLIC PANEL
BUN: 21 mg/dL (ref 6–23)
CO2: 25 meq/L (ref 19–32)
Calcium: 9.6 mg/dL (ref 8.4–10.5)
Chloride: 104 meq/L (ref 96–112)
Creatinine, Ser: 1.19 mg/dL (ref 0.40–1.20)
GFR: 42.67 mL/min — ABNORMAL LOW (ref >60.00)
Glucose, Bld: 87 mg/dL (ref 70–99)
Potassium: 4.5 meq/L (ref 3.5–5.1)
Sodium: 140 meq/L (ref 135–145)

## 2023-04-24 LAB — LIPID PANEL
Cholesterol: 238 mg/dL — ABNORMAL HIGH (ref 0–200)
HDL: 73.7 mg/dL (ref >39.00)
LDL Cholesterol: 140 mg/dL — ABNORMAL HIGH (ref 0–99)
NonHDL: 164.03
Total CHOL/HDL Ratio: 3
Triglycerides: 119 mg/dL (ref 0.0–149.0)
VLDL: 23.8 mg/dL (ref 0.0–40.0)

## 2023-04-24 LAB — HEPATIC FUNCTION PANEL
ALT: 10 U/L (ref 0–35)
AST: 18 U/L (ref 0–37)
Albumin: 4.2 g/dL (ref 3.5–5.2)
Alkaline Phosphatase: 63 U/L (ref 39–117)
Bilirubin, Direct: 0.1 mg/dL (ref 0.0–0.3)
Total Bilirubin: 0.7 mg/dL (ref 0.2–1.2)
Total Protein: 7.1 g/dL (ref 6.0–8.3)

## 2023-04-24 LAB — HEMOGLOBIN A1C: Hgb A1c MFr Bld: 5.6 % (ref 4.6–6.5)

## 2023-04-24 LAB — CBC WITH DIFFERENTIAL/PLATELET
Basophils Absolute: 0.1 10*3/uL (ref 0.0–0.1)
Basophils Relative: 1 % (ref 0.0–3.0)
Eosinophils Absolute: 0.2 10*3/uL (ref 0.0–0.7)
Eosinophils Relative: 3.1 % (ref 0.0–5.0)
HCT: 42.5 % (ref 36.0–46.0)
Hemoglobin: 14 g/dL (ref 12.0–15.0)
Lymphocytes Relative: 31.2 % (ref 12.0–46.0)
Lymphs Abs: 1.8 10*3/uL (ref 0.7–4.0)
MCHC: 33 g/dL (ref 30.0–36.0)
MCV: 96.1 fL (ref 78.0–100.0)
Monocytes Absolute: 0.4 10*3/uL (ref 0.1–1.0)
Monocytes Relative: 7.7 % (ref 3.0–12.0)
Neutro Abs: 3.3 10*3/uL (ref 1.4–7.7)
Neutrophils Relative %: 57 % (ref 43.0–77.0)
Platelets: 367 10*3/uL (ref 150.0–400.0)
RBC: 4.43 Mil/uL (ref 3.87–5.11)
RDW: 14 % (ref 11.5–15.5)
WBC: 5.8 10*3/uL (ref 4.0–10.5)

## 2023-04-24 LAB — TSH: TSH: 2.14 u[IU]/mL (ref 0.35–5.50)

## 2023-04-24 LAB — VITAMIN B12: Vitamin B-12: 745 pg/mL (ref 211–911)

## 2023-04-24 MED ORDER — AMITRIPTYLINE HCL 10 MG PO TABS: 10.0000 mg | ORAL_TABLET | Freq: Every day | ORAL | 3 refills | Status: DC

## 2023-04-24 NOTE — Progress Notes (Signed)
Subjective:    Patient ID: Dawn Harmon, female    DOB: 1940/11/22, 82 y.o.   MRN: [id number]  HPI Here to follow up on issues. We met with her on 03-21-23 and we primarily discussed her chronic fatigue and her insomnia. She has tried a number of medications on the past for sleep, but either they did not work or she had side effects from them. We discussed trying Adderall to help with concentration during the daytime, but she never pick up the medication. She says she is "afraid" of trying new medications. Otherwise she also asks [place] to check a fullness she noticed above the left collarbone a few days ago. There is no tenderness. She still takes Lexapro 5 mg daily to help with depression, but she is not sure whether this helps or not. She still complains about insomnia, and she says she has not slept at all for th past 3 nights. She says she worries all the time about her husband (also my patient) who is dealing with a lot of medical issues now. Her OA is stable. Her IBS is stable.    Review of Systems  Constitutional:  Positive for fatigue.  HENT: Negative.    Eyes: Negative.   Respiratory: Negative.    Cardiovascular: Negative.   Gastrointestinal: Negative.   Genitourinary:  Negative for decreased urine volume, difficulty urinating, dyspareunia, dysuria, enuresis, flank pain, frequency, hematuria, pelvic pain and urgency.  Musculoskeletal:  Positive for arthralgias.  Skin: Negative.   Neurological: Negative.  Negative for headaches.  Psychiatric/Behavioral:  Positive for decreased concentration and sleep disturbance. Negative for agitation, behavioral problems, confusion, dysphoric mood and hallucinations. The patient is nervous/anxious.        Objective:   Physical Exam Constitutional:      General: She is not in acute distress.    Appearance: Normal appearance. She is well-developed.  HENT:     Head: Normocephalic and atraumatic.     Right Ear: External ear normal.     Left Ear:  External ear normal.     Nose: Nose normal.     Mouth/Throat:     Pharynx: No oropharyngeal exudate.  Eyes:     General: No scleral icterus.    Conjunctiva/sclera: Conjunctivae normal.     Pupils: Pupils are equal, round, and reactive to light.  Neck:     Thyroid: No thyromegaly.     Vascular: No JVD.  Cardiovascular:     Rate and Rhythm: Normal rate and regular rhythm.     Pulses: Normal pulses.     Heart sounds: Normal heart sounds. No murmur heard.    No friction rub. No gallop.  Pulmonary:     Effort: Pulmonary effort is normal. No respiratory distress.     Breath sounds: Normal breath sounds. No wheezing or rales.     Comments: There is a little fullness above both clavicles, with the left side being a little more prominent. These are not tender  Chest:     Chest wall: No tenderness.  Abdominal:     General: Bowel sounds are normal. There is no distension.     Palpations: Abdomen is soft. There is no mass.     Tenderness: There is no abdominal tenderness. There is no guarding or rebound.  Musculoskeletal:        General: No tenderness. Normal range of motion.     Cervical back: Normal range of motion and neck supple.  Lymphadenopathy:     Cervical: No  cervical adenopathy.  Skin:    General: Skin is warm and dry.     Findings: No erythema or rash.  Neurological:     General: No focal deficit present.     Mental Status: She is alert and oriented to person, place, and time.     Cranial Nerves: No cranial nerve deficit.     Motor: No abnormal muscle tone.     Coordination: Coordination normal.     Deep Tendon Reflexes: Reflexes are normal and symmetric. Reflexes normal.  Psychiatric:        Behavior: Behavior normal.        Thought Content: Thought content normal.        Judgment: Judgment normal.     Comments: She is quite anxious            Assessment & Plan:  Her main problems continue to be fatigue and insomnia. We will get fasting labs to screen for anemia,  thyroid problems, B12 deficiency, etc. She will stay on Lexapro 5 mg daily, and we will add Amitriptyline 10 mg at bedtime. The supraclavicular masses are most likely fat pads, but we will get a CXR to evaluate. Her OA and IBS are stable. We spent a total of ( 35  ) minutes reviewing records and discussing these issues.  Gershon Crane, MD

## 2023-04-24 NOTE — Addendum Note (Signed)
Addended by: Gershon Crane A on: 04/24/2023 04:56 PM   Modules accepted: Orders

## 2023-04-25 ENCOUNTER — Telehealth: Payer: Self-pay

## 2023-04-25 ENCOUNTER — Other Ambulatory Visit (HOSPITAL_COMMUNITY): Payer: Self-pay

## 2023-04-25 NOTE — Telephone Encounter (Signed)
Pharmacy Patient Advocate Encounter   Received notification from Physician's Office that prior authorization for Amitriptyline  is required/requested.   Insurance verification completed.   The patient is insured through [facility] .   Per test claim: PA required; PA submitted to [facility] via CoverMyMeds Key/confirmation #/EOC Key: [id number]  Status is pending

## 2023-04-26 ENCOUNTER — Encounter: Payer: Medicare HMO | Attending: Physical Medicine & Rehabilitation | Admitting: Physical Medicine & Rehabilitation

## 2023-04-26 ENCOUNTER — Encounter: Payer: Self-pay | Admitting: Physical Medicine & Rehabilitation

## 2023-04-26 VITALS — BP 128/88 | HR 71 | Ht 63.5 in | Wt 154.0 lb

## 2023-04-26 DIAGNOSIS — F331 Major depressive disorder, recurrent, moderate: Secondary | ICD-10-CM | POA: Diagnosis not present

## 2023-04-26 DIAGNOSIS — G479 Sleep disorder, unspecified: Secondary | ICD-10-CM

## 2023-04-26 DIAGNOSIS — G9332 Myalgic encephalomyelitis/chronic fatigue syndrome: Secondary | ICD-10-CM

## 2023-04-26 NOTE — Progress Notes (Signed)
Subjective:    Patient ID: Dawn Harmon, female    DOB: 1941/03/13, 82 y.o.   MRN: [id number]  HPI HPI  Dawn Harmon is a 82 y.o. year old female  who  has a past medical history of Chronic fatigue fibromyalgia syndrome, CKD (chronic kidney disease), Depression, Insomnia, Osteoarthritis, and Osteoporosis.   They are presenting to PM&R clinic as a new patient for pain management evaluation. They were referred by Dr. Casimiro Needle for treatment of chronic fatigue pain.  Patient reports she has had issues with chronic fatigue for many years.  She reports her issues started when she noticed nausea around 1985.  Symptoms have worsened since this time.  She reports that after any kind of activity or exercise she would develop very severe fatigue that will last for several days.  Reports very poor energy overall.  She said she did not sleep well overnight.  She reports she saw sleep doctor in the past.  Symptoms have worsened over the past 4 years.  She thinks this may be related to getting the COVID-vaccine.  Patient has history of depression.  She says this is due to her fatigue. She is taking ashwaganda and lexapro.  She reports she has seen mental health providers in the past, did not think this was very beneficial and does not want to do this at this time.  She reports she has a lot of stress regarding her husband who has a lot of medical issues himself.  She denies having any significant pain at this time.  She also has a history of IBS.  Red flag symptoms: No red flags for back pain endorsed in Hx or ROS  Medications tried: Lexapro, ashwagandha Amitriptyline at bedtime 10 mg Other treatments: PT/OT denies   Prior UDS results: No results found for: "LABOPIA", "COCAINSCRNUR", "LABBENZ", "AMPHETMU", "THCU", "LABBARB"   Pain Inventory Average Pain 0 Pain Right Now 0 My pain is  None  In the last 24 hours, has pain interfered with the following? General activity 0 Relation with others  0 Enjoyment of life 0  Sleep (in general) Poor  Pain is worse with:  No Pain Pain improves with:  No Pain Relief from Meds:  No Pain  walk without assistance ability to climb steps?  yes do you drive?  yes  retired  No problems in this area  Any changes since last visit?  no  Any changes since last visit?  no    Family History  Problem Relation Age of Onset   Breast cancer Mother    Suicidality Father    Arthritis Father    Depression Father    Breast cancer Sister    Social History   Socioeconomic History   Marital status: Married    Spouse name: Not on file   Number of children: 2   Years of education: HS   Highest education level: Not on file  Occupational History   Occupation: Retired  Tobacco Use   Smoking status: Never   Smokeless tobacco: Never  Vaping Use   Vaping status: Never Used  Substance and Sexual Activity   Alcohol use: Yes    Comment: May have one glass of wine   Drug use: No   Sexual activity: Not on file  Other Topics Concern   Not on file  Social History Narrative   Lives at home with her husband.   Right-handed.   1-2 cups caffeine per day.   Social Determinants of Health  Financial Resource Strain: Low Risk  (08/21/2022)   Overall Financial Resource Strain (CARDIA)    Difficulty of Paying Living Expenses: Not hard at all  Food Insecurity: No Food Insecurity (08/21/2022)   Hunger Vital Sign    Worried About Running Out of Food in the Last Year: Never true    Ran Out of Food in the Last Year: Never true  Transportation Needs: No Transportation Needs (08/21/2022)   PRAPARE - Administrator, Civil Service (Medical): No    Lack of Transportation (Non-Medical): No  Physical Activity: Inactive (08/21/2022)   Exercise Vital Sign    Days of Exercise per Week: 0 days    Minutes of Exercise per Session: 0 min  Stress: No Stress Concern Present (08/21/2022)   [employer] of Occupational Health - Occupational Stress  Questionnaire    Feeling of Stress : Not at all  Recent Concern: Stress - Stress Concern Present (05/29/2022)   [employer] of Occupational Health - Occupational Stress Questionnaire    Feeling of Stress : Rather much  Social Connections: Moderately Isolated (08/21/2022)   Social Connection and Isolation Panel [NHANES]    Frequency of Communication with Friends and Family: More than three times a week    Frequency of Social Gatherings with Friends and Family: More than three times a week    Attends Religious Services: Never    Database administrator or Organizations: No    Attends Engineer, structural: Never    Marital Status: Married   Past Surgical History:  Procedure Laterality Date   BUNIONECTOMY     HERNIA REPAIR     TONSILLECTOMY     Past Medical History:  Diagnosis Date   Chronic fatigue fibromyalgia syndrome    CKD (chronic kidney disease)    Depression    Insomnia    Osteoarthritis    neck   Osteoporosis    Ht 5' 3.5" (1.613 m)   Wt 154 lb (69.9 kg)   BMI 26.85 kg/m   Opioid Risk Score:   Fall Risk Score:  `1  Depression screen PHQ 2/9     04/26/2023    1:00 PM 10/19/2022   10:57 AM 08/21/2022    3:41 PM 06/07/2022   10:31 AM 03/05/2014    1:25 PM  Depression screen PHQ 2/9  Decreased Interest 0 0 0 0 0  Down, Depressed, Hopeless 0 0 0 0 0  PHQ - 2 Score 0 0 0 0 0  Altered sleeping 3 3  0   Tired, decreased energy 3 3  3    Change in appetite 0 0  0   Feeling bad or failure about yourself  0 0  0   Trouble concentrating 0 3  3   Moving slowly or fidgety/restless 0 0  0   Suicidal thoughts 0 0  0   PHQ-9 Score 6 9  6    Difficult doing work/chores Extremely dIfficult         Review of Systems  All other systems reviewed and are negative.     Objective:   Physical Exam  Gen: no distress, normal appearing HEENT: oral mucosa pink and moist, NCAT Cardio: Reg rate Chest: normal effort, normal rate of breathing Abd: soft,  non-distended Ext: no edema Psych: pleasant, affect a little anxious  Skin: intact Neuro: Alert and awake, follows commands, cranial nerves II through XII grossly intact, normal speech and language RUE: 5/5 Deltoid, 5/5 Biceps, 5/5 Triceps, 5/5 Wrist Ext,  5/5 Grip LUE: 5/5 Deltoid, 5/5 Biceps, 5/5 Triceps, 5/5 Wrist Ext, 5/5 Grip RLE: HF 5/5, KE 5/5, KF 5/5, ADF 5/5, APF 5/5 LLE: HF 5/5, KE 5/5, HF, 5/5, ADF 5/5, APF 5/5 Sensory exam normal for light touch and pain in all 4 limbs. No limb ataxia or cerebellar signs. No abnormal tone appreciated.  Musculoskeletal: Patient noted to have some mild tenderness from her trapezius muscles and shoulders. SLR negative, mild tenderness joint line of the knees right greater than left Patient with tenderness noted around her left ankle Spurling's test negative No significant paraspinal tenderness in the left lumbar or cervical spine noted today       Assessment & Plan:  1) Chronic fatigue syndrome 2)Depression, denies active SI or HI 3)Sleep disorder -Discussed gradual progressive low impact exercise -I do think aquatic therapy would be a good option for her, she reports she tried this once however felt uncomfortable in a pool.  We discussed trying going to [facility] where she would have the option of working in an aquatic tank. -Advised sleep hygiene -Potentially consult to rheumatology could be considered? -I think she would benefit from seeing psychiatry however she is not interested in this at this time -Currently patient denies any significant or limiting pain or discomfort, Follow-up as needed she develops issues with pain or worsening of her OA

## 2023-04-27 ENCOUNTER — Encounter: Payer: Medicare HMO | Admitting: Family Medicine

## 2023-04-27 ENCOUNTER — Other Ambulatory Visit (HOSPITAL_COMMUNITY): Payer: Self-pay

## 2023-04-27 NOTE — Telephone Encounter (Signed)
Pharmacy Patient Advocate Encounter  Received notification from [facility] that Prior Authorization for Amitriptyline HCl 10MG  tablets has been APPROVED from 07/03/2022 to 07/03/2023   PA #/Case ID/Reference #:  [id number]

## 2023-04-27 NOTE — Telephone Encounter (Signed)
Spoke with pt notified of approval for her migraine medication, advised to pick up Rx from pharmacy

## 2023-05-08 ENCOUNTER — Ambulatory Visit
Admission: RE | Admit: 2023-05-08 | Discharge: 2023-05-08 | Disposition: A | Discharge status: Home / Self Care | Payer: Medicare HMO | Source: Ambulatory Visit | Attending: Family Medicine | Admitting: Family Medicine

## 2023-05-08 DIAGNOSIS — Z780 Asymptomatic menopausal state: Secondary | ICD-10-CM

## 2023-05-08 DIAGNOSIS — Z1231 Encounter for screening mammogram for malignant neoplasm of breast: Secondary | ICD-10-CM

## 2023-05-15 NOTE — Progress Notes (Signed)
Good morning! I see you are listed as this patient's PCP in the computer now-- will you be taking over her care? Thanks!

## 2023-06-22 ENCOUNTER — Ambulatory Visit: Payer: Medicare HMO | Admitting: Physician Assistant

## 2023-07-08 NOTE — Progress Notes (Addendum)
 Cardiology Office Note:  .   Date:  07/08/2023  ID:  BRYAR RENNIE, DOB 07/07/1940, MRN [id number] PCP: Johnny Garnette LABOR, MD  [facility] Providers Cardiologist:  None Electrophysiologist:  Will Gladis Norton, MD {  History of Present Illness: .   ETTEL ALBERGO is a 83 y.o. 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 Francyne 07/10/22 for palpitations noted to have PVCs. Reports chronic fatigue syndrome that had plagued her for >30 years,   Prior w/u  for her ventricular arrhythmia back in 2015 showed normal left ventricular systolic function (EF 50-55%, no LVH, no valvular abnormalities) and ECG treadmill stress testing was normal with the exception of frequent PVCs and ventricular couplets.   More recently LVEF remained preserved 60-65%, Dr. Francyne reported prior discussions and the same at this visit complaints have nothing to do with her recent stroke or with the arrhythmia (she is oblivious to this), but rather with her longstanding complaints of extreme fatigue.  As she has told me before, she has no desire to live, she wishes that she would die, 2/2 her fatigue Historically tried metoprolol  cause diarrhea and stopped, also suspected that any BB would likely make her fatigue worse.  Ultimately referred to EP, saw Dr. Norton march 2024, monitoring with anywhere from 6-25% PVC burden, was on propranolol  with resulting worsened fatigue Propranolol  was stopped and started on flecainide   Numerous conversations/notes As below QRS widened on flecainide  > mexiletine > amiodarone   Saw Dr. Norton 12/22/22, no clear improvement or change at all with amio in her fatigue, though persisted and life impacting By exam, no PVCs heard Planned to stop amiodarone  for 2 weeks assess symptoms, if fatigue is better would stop it  Today's visit is scheduled as a 6 mo visit  ROS:   She has no cardiac awareness No CP, has never been aware  of PVCs, no palpitations No SOB No near syncope or syncope.  She c/w debilitating chronic severe fatigue, goes back decades Her husband's health has been rapidly declining, she suspects the trigger was the covid vaccine >> worsening neuro-like symptoms ?dementia She is more and more having to care for him proving very difficult given some days she is so tired she can barely get one foot in front of the other She never sleeps, if she does, it is no more then one at best 2 hours here/there She says she has seen numerous specialists, numerous times with no help Lexapro  has been the most helpful She has started an herbal gtt that has helped slightly in getting to sleep, but not staying asleep   Arrhythmia/AAD hx Intolerant of BB with worsened fatigue Flecainide  started 09/15/22 (PVCs) Flecainide  stopped at her f/u EKG with >30% increase in QRS duration  Mexiletine started April 2024 >> intolerant w/profound weakness Amiodarone  started April 2024, pt didn't >> eventually did though May 2024 >> stopped with failure to improve symptoms  Studies Reviewed: SABRA    EKG not done today   Risk Assessment/Calculations:    Physical Exam:   VS:  There were no vitals taken for this visit.   Wt Readings from Last 3 Encounters:  04/26/23 154 lb (69.9 kg)  04/24/23 157 lb (71.2 kg)  03/21/23 157 lb 12.8 oz (71.6 kg)    GEN: Well nourished, well developed in no acute distress NECK: No JVD; No carotid bruits CARDIAC: RRR, no extrasystoles heard today with prolonged auscultation, no murmurs, rubs, gallops  RESPIRATORY:   CTA b/l without rales, wheezing or rhonchi  ABDOMEN: Soft, non-tender, non-distended EXTREMITIES:  No edema; No deformity   ASSESSMENT AND PLAN: .    PVCs NSVT No clear symptoms attributed to them Meds either made her worse, at least no better None on exam today preserved LVEF   She will f/u with Dr. Francyne as scheduled, unclear what EP can offer, will reach out to Dr.  Inocencio for any further recommendations 07/10/22: ADDEND: Dr. Inocencio reviewed, nothing further at this time from EP perspective. Charlies Arthur, PA-C 07/23/23 ADDEND: Dr. Francyne reviewed Nothing further from his perspective either Charlies Arthur, PA-C  Dispo: as anette  Signed, Charlies Macario Arthur, PA-C

## 2023-07-10 ENCOUNTER — Ambulatory Visit: Payer: Medicare HMO | Attending: Physician Assistant | Admitting: Physician Assistant

## 2023-07-10 ENCOUNTER — Encounter: Payer: Self-pay | Admitting: Physician Assistant

## 2023-07-10 VITALS — BP 100/60 | HR 72 | Ht 64.0 in | Wt 156.8 lb

## 2023-07-10 DIAGNOSIS — I4729 Other ventricular tachycardia: Secondary | ICD-10-CM | POA: Diagnosis not present

## 2023-07-10 DIAGNOSIS — I493 Ventricular premature depolarization: Secondary | ICD-10-CM

## 2023-07-10 NOTE — Patient Instructions (Signed)
 Medication Instructions:   Your physician recommends that you continue on your current medications as directed. Please refer to the Current Medication list given to you today.   *If you need a refill on your cardiac medications before your next appointment, please call your pharmacy*   Lab Work: NONE ORDERED  TODAY     If you have labs (blood work) drawn today and your tests are completely normal, you will receive your results only by: MyChart Message (if you have MyChart) OR A paper copy in the mail If you have any lab test that is abnormal or we need to change your treatment, we will call you to review the results.   Testing/Procedures: NONE ORDERED  TODAY    Follow-Up: At [facility], you and your health needs are our priority.  As part of our continuing mission to provide you with exceptional heart care, we have created designated Provider Care Teams.  These Care Teams include your primary Cardiologist (physician) and Advanced Practice Providers (APPs -  Physician Assistants and Nurse Practitioners) who all work together to provide you with the care you need, when you need it.  We recommend signing up for the patient portal called MyChart.  Sign up information is provided on this After Visit Summary.  MyChart is used to connect with patients for Virtual Visits (Telemedicine).  Patients are able to view lab/test results, encounter notes, upcoming appointments, etc.  Non-urgent messages can be sent to your provider as well.   To learn more about what you can do with MyChart, go to [email or web address].    Your next appointment:   AS SCHEDULED WITH DR C.    Other Instructions

## 2023-07-17 DIAGNOSIS — E785 Hyperlipidemia, unspecified: Secondary | ICD-10-CM | POA: Diagnosis not present

## 2023-07-17 DIAGNOSIS — N1832 Chronic kidney disease, stage 3b: Secondary | ICD-10-CM | POA: Diagnosis not present

## 2023-07-17 DIAGNOSIS — N1831 Chronic kidney disease, stage 3a: Secondary | ICD-10-CM | POA: Diagnosis not present

## 2023-07-18 ENCOUNTER — Ambulatory Visit (INDEPENDENT_AMBULATORY_CARE_PROVIDER_SITE_OTHER): Payer: Medicare Other | Admitting: Internal Medicine

## 2023-07-18 VITALS — BP 128/86 | HR 77 | Temp 97.4°F | Ht 64.0 in | Wt 153.8 lb

## 2023-07-18 DIAGNOSIS — R3989 Other symptoms and signs involving the genitourinary system: Secondary | ICD-10-CM

## 2023-07-18 DIAGNOSIS — R3915 Urgency of urination: Secondary | ICD-10-CM | POA: Diagnosis not present

## 2023-07-18 LAB — POCT URINALYSIS DIPSTICK
Bilirubin, UA: POSITIVE
Blood, UA: POSITIVE
Glucose, UA: NEGATIVE
Ketones, UA: POSITIVE
Nitrite, UA: POSITIVE
Protein, UA: POSITIVE — AB
Spec Grav, UA: 1.015 (ref 1.010–1.025)
Urobilinogen, UA: 2 U/dL — AB
pH, UA: 5.5 (ref 5.0–8.0)

## 2023-07-18 MED ORDER — SULFAMETHOXAZOLE-TRIMETHOPRIM 800-160 MG PO TABS
1.0000 | ORAL_TABLET | Freq: Two times a day (BID) | ORAL | 0 refills | Status: DC
Start: 2023-07-18 — End: 2023-09-19

## 2023-07-18 NOTE — Progress Notes (Signed)
 Chief Complaint  Patient presents with   Urinary Urgency    Pt reports urine urgency and frequency. Going on 1 day ago.     HPI: Dawn Harmon 83 y.o. come in for Chronic disease management  ROS: See pertinent posiew onset of dysuria and urgency like a UTI  was seen in renal office yesterday (  dx of stage 3 ckd Dr Higinio Love) no intervention now) and urine and small wbc but not felt to be uti   however last pm progressive sx as in past for  uti No fever flank or abd pain  Took azo with help for comfort .  Past Medical History:  Diagnosis Date   Chronic fatigue fibromyalgia syndrome    CKD (chronic kidney disease)    Depression    Insomnia    Osteoarthritis    neck   Osteoporosis     Family History  Problem Relation Age of Onset   Breast cancer Mother    Suicidality Father    Arthritis Father    Depression Father    Breast cancer Sister     Social History   Socioeconomic History   Marital status: Married    Spouse name: Not on file   Number of children: 2   Years of education: HS   Highest education level: Not on file  Occupational History   Occupation: Retired  Tobacco Use   Smoking status: Never   Smokeless tobacco: Never  Vaping Use   Vaping status: Never Used  Substance and Sexual Activity   Alcohol use: Yes    Comment: May have one glass of wine   Drug use: No   Sexual activity: Not on file  Other Topics Concern   Not on file  Social History Narrative   Lives at home with her husband.   Right-handed.   1-2 cups caffeine per day.   Social Drivers of Corporate investment banker Strain: Low Risk  (08/21/2022)   Overall Financial Resource Strain (CARDIA)    Difficulty of Paying Living Expenses: Not hard at all  Food Insecurity: No Food Insecurity (08/21/2022)   Hunger Vital Sign    Worried About Running Out of Food in the Last Year: Never true    Ran Out of Food in the Last Year: Never true  Transportation Needs: No Transportation Needs (08/21/2022)    PRAPARE - Administrator, Civil Service (Medical): No    Lack of Transportation (Non-Medical): No  Physical Activity: Inactive (08/21/2022)   Exercise Vital Sign    Days of Exercise per Week: 0 days    Minutes of Exercise per Session: 0 min  Stress: No Stress Concern Present (08/21/2022)   [employer] of Occupational Health - Occupational Stress Questionnaire    Feeling of Stress : Not at all  Recent Concern: Stress - Stress Concern Present (05/29/2022)   [employer] of Occupational Health - Occupational Stress Questionnaire    Feeling of Stress : Rather much  Social Connections: Moderately Isolated (08/21/2022)   Social Connection and Isolation Panel [NHANES]    Frequency of Communication with Friends and Family: More than three times a week    Frequency of Social Gatherings with Friends and Family: More than three times a week    Attends Religious Services: Never    Database administrator or Organizations: No    Attends Banker Meetings: Never    Marital Status: Married    Outpatient Medications Prior to Visit  Medication Sig Dispense Refill   ASHWAGANDHA PO Take 1 Capful by mouth daily at 12 noon.     aspirin  EC 81 MG tablet Take 1 tablet (81 mg total) by mouth daily. Swallow whole.     Coenzyme Q10 (COQ10 PO) Take 1 teaspoon by mouth daily     Cranberry 300 MG tablet Take 300 mg by mouth daily.     escitalopram  (LEXAPRO ) 5 MG tablet Take 1 tablet (5 mg total) by mouth daily. 90 tablet 3   Multiple Vitamin (MULTIVITAMIN) tablet Take 1 tablet by mouth daily.     Multiple Vitamins-Minerals (HAIR SKIN & NAILS PO) Take 2 tablets by mouth daily.     Multiple Vitamins-Minerals (PRESERVISION AREDS 2 PO) Take 2 tablets by mouth daily.     TURMERIC PO Take 1 capsule by mouth daily.     No facility-administered medications prior to visit.     EXAM:  BP 128/86 (BP Location: Left Arm, Patient Position: Sitting, Cuff Size: Normal)   Pulse 77   Temp  (!) 97.4 F (36.3 C) (Oral)   Ht 5\' 4"  (1.626 m)   Wt 153 lb 12.8 oz (69.8 kg)   SpO2 98%   BMI 26.40 kg/m   Body mass index is 26.4 kg/m.  GENERAL: vitals reviewed and listed above, alert, oriented, appears well hydrated and in no acute distress HEENT: atraumatic, conjunctiva  clear, no obvious abnormalities on inspection of external nose and ears OP : no lesion edema or exudate  NECK: no obvious masses on inspection palpation  LUNGS: clear to auscultation bilaterally, no wheezes, rales or rhonchi, good air movement CV: HRRR, no clubbing cyanosis or  peripheral edema nl cap refill  MS: moves all extremities without noticeable focal  abnormality PSYCH: pleasant and cooperative, no obvious depression or anxiety Lab Results  Component Value Date   WBC 5.8 04/24/2023   HGB 14.0 04/24/2023   HCT 42.5 04/24/2023   PLT 367.0 04/24/2023   GLUCOSE 87 04/24/2023   CHOL 238 (H) 04/24/2023   TRIG 119.0 04/24/2023   HDL 73.70 04/24/2023   LDLDIRECT 128.8 02/28/2011   LDLCALC 140 (H) 04/24/2023   ALT 10 04/24/2023   AST 18 04/24/2023   NA 140 04/24/2023   K 4.5 04/24/2023   CL 104 04/24/2023   CREATININE 1.19 04/24/2023   BUN 21 04/24/2023   CO2 25 04/24/2023   TSH 2.14 04/24/2023   HGBA1C 5.6 04/24/2023   BP Readings from Last 3 Encounters:  07/18/23 128/86  07/10/23 100/60  04/26/23 128/88   Lab Results  Component Value Date   COLORU orange 07/18/2023   CLARITYU cloudy 07/18/2023   GLUCOSEUR Negative 07/18/2023   BILIRUBINUR Positive 07/18/2023   KETONESU Positive 07/18/2023   SPECGRAV 1.015 07/18/2023   RBCUR Positive 07/18/2023   PHUR 5.5 07/18/2023   PROTEINUR Positive (A) 07/18/2023   UROBILINOGEN 2.0 (A) 07/18/2023   LEUKOCYTESUR Large (3+) (A) 07/18/2023     ASSESSMENT AND PLAN:  Discussed the following assessment and plan:  Suspected UTI  Urinary urgency - Plan: POC Urinalysis Dipstick, Urine Culture Uncomplicated by hx   Did well on septra  in past  years  Rx septra  bid for 3-5 days at most Ucx pending  Contact if  persistent or progressive as counseled  -Patient advised to return or notify health care team  if  new concerns arise.  Patient Instructions  This acts like typical UTI and should respond to  antibiotic .  Culture pending and if not  getting better contact the team .  Alexi Geibel K. Leiland Mihelich M.D.

## 2023-07-18 NOTE — Patient Instructions (Signed)
 This acts like typical UTI and should respond to  antibiotic .  Culture pending and if not getting better contact the team .

## 2023-07-20 LAB — URINE CULTURE
MICRO NUMBER:: 15959224
SPECIMEN QUALITY:: ADEQUATE

## 2023-07-21 ENCOUNTER — Encounter: Payer: Self-pay | Admitting: Internal Medicine

## 2023-07-21 NOTE — Progress Notes (Signed)
Uti confirmed ; should resolve with antibiotic given .Fu is ongoing problem

## 2023-08-23 ENCOUNTER — Encounter: Payer: Medicare HMO | Admitting: Family Medicine

## 2023-09-11 ENCOUNTER — Ambulatory Visit (INDEPENDENT_AMBULATORY_CARE_PROVIDER_SITE_OTHER): Payer: Medicare HMO | Admitting: Family Medicine

## 2023-09-11 DIAGNOSIS — Z0000 Encounter for general adult medical examination without abnormal findings: Secondary | ICD-10-CM

## 2023-09-11 NOTE — Progress Notes (Signed)
 PATIENT CHECK-IN and HEALTH RISK ASSESSMENT QUESTIONNAIRE:  -completed by phone/video for upcoming Medicare Preventive Visit   Pre-Visit Check-in: 1)Vitals (height, wt, BP, etc) - record in vitals section for visit on day of visit Request home vitals (wt, BP, etc.) and enter into vitals, THEN update Vital Signs SmartPhrase below at the top of the HPI. See below.  2)Review and Update Medications, Allergies PMH, Surgeries, Social history in Epic 3)Hospitalizations in the last year with date/reason?  n  4)Review and Update Care Team (patient's specialists) in Epic 5) Complete PHQ9 in Epic  6) Complete Fall Screening in Epic 7)Review all Health Maintenance Due and order under PCP if not done.  Medicare Wellness Patient Questionnaire:  Answer theses question about your habits: How often do you have a drink containing alcohol? 1 glass of wine once a week Have you ever smoked?n On average, how many days per week do you engage in moderate to strenuous exercise (like a brisk walk)?none, chronic fatigue syndrome Typical diet: feels like diet is ok sometimes - eats a lot of veggies and fruits, chicken and veggies, pizza sometimes, dairy, ice cream, cookies, is a sweet tooth  Beverages: coffee and water Caregiver for husband, has a lot of stress, has son and daughter who help some   Answer theses question about your everyday activities: Can you perform most household chores?y Are you deaf or have significant trouble hearing?n Do you feel that you have a problem with memory?n Do you feel safe at home?y Last dentist visit?reports regular care 8. Do you have any difficulty performing your everyday activities?some - has chronic fatigue Are you having any difficulty walking, taking medications on your own, and or difficulty managing daily home needs?n Do you have difficulty walking or climbing stairs?n Do you have difficulty dressing or bathing?n Do you have difficulty doing errands alone such as  visiting a doctor's office or shopping?n Do you currently have any difficulty preparing food and eating?n Do you currently have any difficulty using the toilet?n Do you have any difficulty managing your finances?n Do you have any difficulties with housekeeping of managing your housekeeping?n   Do you have Advanced Directives in place (Living Will, Healthcare Power or Attorney)? y   Last eye Exam and location? Goes for regular visits   Do you currently use prescribed or non-prescribed narcotic or opioid pain medications?n  Do you have a history or close family history of breast, ovarian, tubal or peritoneal cancer or a family member with BRCA (breast cancer susceptibility 1 and 2) gene mutations?    ----------------------------------------------------------------------------------------------------------------------------------------------------------------------------------------------------------------------  Because this visit was a virtual/telehealth visit, some criteria may be missing or patient reported. Any vitals not documented were not able to be obtained and vitals that have been documented are patient reported.    MEDICARE ANNUAL PREVENTIVE VISIT WITH PROVIDER: (Welcome to Medicare, initial annual wellness or annual wellness exam)  Virtual Visit via Phone Note  I connected with RILLIE RIFFEL on 09/11/23 by phone and verified that I am speaking with the correct person using two identifiers. Declines video visit - prefers phone.  Location patient: home Location provider:work or home office Persons participating in the virtual visit: patient, provider  Concerns and/or follow up today: stable - struggles with CFS and nobody has been able to help her. Poor sleep, no exercise, poor social connections, high stress. Diet is ok.    See HM section in Epic for other details of completed HM.    ROS: negative for report of fevers,  unintentional weight loss, vision changes, vision  loss, hearing loss or change, chest pain, sob, hemoptysis, melena, hematochezia, hematuria, falls, bleeding or bruising, thoughts of suicide or self harm, memory loss  Patient-completed extensive health risk assessment - reviewed and discussed with the patient: See Health Risk Assessment completed with patient prior to the visit either above or in recent phone note. This was reviewed in detailed with the patient today and appropriate recommendations, orders and referrals were placed as needed per Summary below and patient instructions.   Review of Medical History: -PMH, PSH, Family History and current specialty and care providers reviewed and updated and listed below   Patient Care Team: Nelwyn Salisbury, MD as PCP - General (Family Medicine) Regan Lemming, MD as PCP - Electrophysiology (Cardiology) Lapp, Billey Chang as Physician Assistant (Internal Medicine)   Past Medical History:  Diagnosis Date   Chronic fatigue fibromyalgia syndrome    CKD (chronic kidney disease)    Depression    Insomnia    Osteoarthritis    neck   Osteoporosis     Past Surgical History:  Procedure Laterality Date   BUNIONECTOMY     HERNIA REPAIR     TONSILLECTOMY      Social History   Socioeconomic History   Marital status: Married    Spouse name: Not on file   Number of children: 2   Years of education: HS   Highest education level: Not on file  Occupational History   Occupation: Retired  Tobacco Use   Smoking status: Never   Smokeless tobacco: Never  Vaping Use   Vaping status: Never Used  Substance and Sexual Activity   Alcohol use: Yes    Comment: May have one glass of wine   Drug use: No   Sexual activity: Not on file  Other Topics Concern   Not on file  Social History Narrative   Lives at home with her husband.   Right-handed.   1-2 cups caffeine per day.   Social Drivers of Corporate investment banker Strain: Low Risk  (08/21/2022)   Overall Financial Resource Strain  (CARDIA)    Difficulty of Paying Living Expenses: Not hard at all  Food Insecurity: No Food Insecurity (08/21/2022)   Hunger Vital Sign    Worried About Running Out of Food in the Last Year: Never true    Ran Out of Food in the Last Year: Never true  Transportation Needs: No Transportation Needs (08/21/2022)   PRAPARE - Administrator, Civil Service (Medical): No    Lack of Transportation (Non-Medical): No  Physical Activity: Inactive (08/21/2022)   Exercise Vital Sign    Days of Exercise per Week: 0 days    Minutes of Exercise per Session: 0 min  Stress: No Stress Concern Present (08/21/2022)   [employer] of Occupational Health - Occupational Stress Questionnaire    Feeling of Stress : Not at all  Recent Concern: Stress - Stress Concern Present (05/29/2022)   [employer] of Occupational Health - Occupational Stress Questionnaire    Feeling of Stress : Rather much  Social Connections: Moderately Isolated (08/21/2022)   Social Connection and Isolation Panel [NHANES]    Frequency of Communication with Friends and Family: More than three times a week    Frequency of Social Gatherings with Friends and Family: More than three times a week    Attends Religious Services: Never    Database administrator or Organizations: No    Attends  Club or Organization Meetings: Never    Marital Status: Married  Catering manager Violence: Not At Risk (08/21/2022)   Humiliation, Afraid, Rape, and Kick questionnaire    Fear of Current or Ex-Partner: No    Emotionally Abused: No    Physically Abused: No    Sexually Abused: No    Family History  Problem Relation Age of Onset   Breast cancer Mother    Suicidality Father    Arthritis Father    Depression Father    Breast cancer Sister     Current Outpatient Medications on File Prior to Visit  Medication Sig Dispense Refill   ASHWAGANDHA PO Take 1 Capful by mouth daily at 12 noon.     aspirin EC 81 MG tablet Take 1 tablet (81 mg  total) by mouth daily. Swallow whole.     Coenzyme Q10 (COQ10 PO) Take 1 teaspoon by mouth daily     Cranberry 300 MG tablet Take 300 mg by mouth daily.     escitalopram (LEXAPRO) 5 MG tablet Take 1 tablet (5 mg total) by mouth daily. 90 tablet 3   Multiple Vitamin (MULTIVITAMIN) tablet Take 1 tablet by mouth daily.     Multiple Vitamins-Minerals (HAIR SKIN & NAILS PO) Take 2 tablets by mouth daily.     Multiple Vitamins-Minerals (PRESERVISION AREDS 2 PO) Take 2 tablets by mouth daily.     sulfamethoxazole-trimethoprim (BACTRIM DS) 800-160 MG tablet Take 1 tablet by mouth 2 (two) times daily. For UTI 10 tablet 0   TURMERIC PO Take 1 capsule by mouth daily.     No current facility-administered medications on file prior to visit.    Allergies  Allergen Reactions   Bupropion Other (See Comments)    Unknown reaction   Doxepin Hcl Other (See Comments)    Unknown reaction    Duloxetine Hcl Other (See Comments)    Unknown reaction    Hydroxyzine Other (See Comments)    Unknown reaction    Levofloxacin Other (See Comments)    insomnia   Mirtazapine Other (See Comments)    Unknown reaction    Nitrofurantoin Other (See Comments)    Unknown reaction    Temazepam Other (See Comments)    Unknown reaction    Zolpidem Tartrate Other (See Comments)    Unknown reaction        Physical Exam Vitals requested from patient and listed below if patient had equipment and was able to obtain at home for this virtual visit: There were no vitals filed for this visit. Estimated body mass index is 26.4 kg/m as calculated from the following:   Height as of 07/18/23: 5\' 4"  (1.626 m).   Weight as of 07/18/23: 153 lb 12.8 oz (69.8 kg).  EKG (optional): deferred due to virtual visit  GENERAL: alert, oriented, no acute distress detected, full vision exam deferred due to pandemic and/or virtual encounter PSYCH/NEURO: pleasant and cooperative, no obvious depression or anxiety, speech and thought  processing grossly intact, Cognitive function grossly intact  Flowsheet Row Office Visit from 04/26/2023 in [facility]  PHQ-9 Total Score 6           09/11/2023    5:12 PM 04/26/2023    1:00 PM 10/19/2022   10:57 AM 08/21/2022    3:41 PM 06/07/2022   10:31 AM  Depression screen PHQ 2/9  Decreased Interest 0 0 0 0 0  Down, Depressed, Hopeless 0 0 0 0 0  PHQ - 2 Score  0 0 0 0 0  Altered sleeping  3 3  0  Tired, decreased energy  3 3  3   Change in appetite  0 0  0  Feeling bad or failure about yourself   0 0  0  Trouble concentrating  0 3  3  Moving slowly or fidgety/restless  0 0  0  Suicidal thoughts  0 0  0  PHQ-9 Score  6 9  6   Difficult doing work/chores  Extremely dIfficult          06/07/2022   10:30 AM 08/21/2022    3:42 PM 01/29/2023    2:01 PM 04/26/2023   12:58 PM 09/11/2023    5:11 PM  Fall Risk  Falls in the past year? 0 0 0 0 0  Was there an injury with Fall? 0 0 0  0  Fall Risk Category Calculator 0 0 0  0  Fall Risk Category (Retired) Low      (RETIRED) Patient Fall Risk Level Low fall risk      Patient at Risk for Falls Due to No Fall Risks No Fall Risks No Fall Risks    Fall risk Follow up Falls evaluation completed Falls prevention discussed Falls evaluation completed  Falls evaluation completed;Education provided     SUMMARY AND PLAN:  Encounter for Medicare annual wellness exam   Discussed applicable health maintenance/preventive health measures and advised and referred or ordered per patient preferences: -discussed vaccines due, risks and can get at pharmacy if she wishes to do Health Maintenance  Topic Date Due   Zoster Vaccines- Shingrix (1 of 2) Never done   DTaP/Tdap/Td (2 - Tdap) 07/03/2014   COVID-19 Vaccine (4 - 2024-25 season) 03/04/2023   Medicare Annual Wellness (AWV)  09/10/2024   Pneumonia Vaccine 63+ Years old  Completed   INFLUENZA VACCINE  Completed   DEXA SCAN  Completed   HPV VACCINES   Aged Out      Education and counseling on the following was provided based on the above review of health and a plan/checklist for the patient, along with additional information discussed, was provided for the patient in the patient instructions :  -Advised and counseled on a healthy lifestyle - including the importance of a healthy diet, regular physical activity, social connections and stress management and good sleep -spent some time talking about sleep, she is considering the recommendation to seek out sleep iCBT, and is willing to try eliminating white light exposure leading up to bedtime and during the night for awakenings. Discussed red light and red light night lights.  -Reviewed patient's current diet. Advised and counseled on a whole foods based healthy diet. A summary of a healthy diet was provided in the Patient Instructions.  -reviewed patient's current physical activity level and discussed exercise guidelines for adults. Discussed community resources and ideas for safe exercise at home to assist in meeting exercise guideline recommendations in a safe and healthy way. Encouraged to start with a walk to the mailbox daily with her husband who wants her to walk and then increase very gradually - alternating days with chair exercise (starting with only 5 minutes.) -spent some time discussion opportunities for social interactions. For not getting out is tough so discussed some virtual options.  -Advise yearly dental visits at minimum and regular eye exams   Follow up: see patient instructions     Patient Instructions  I really enjoyed getting to talk with you today! I am available on Tuesdays and  Thursdays for virtual visits if you have any questions or concerns, or if I can be of any further assistance.   CHECKLIST FROM ANNUAL WELLNESS VISIT:  -Follow up (please call to schedule if not scheduled after visit):   -yearly for annual wellness visit with primary care office  Here is a  list of your preventive care/health maintenance measures and the plan for each if any are due:  PLAN For any measures below that may be due:   Health Maintenance  Topic Date Due   Zoster Vaccines- Shingrix (1 of 2) Never done   DTaP/Tdap/Td (2 - Tdap) 07/03/2014   COVID-19 Vaccine (4 - 2024-25 season) 03/04/2023   Medicare Annual Wellness (AWV)  09/10/2024   Pneumonia Vaccine 44+ Years old  Completed   INFLUENZA VACCINE  Completed   DEXA SCAN  Completed   HPV VACCINES  Aged Out    -See a dentist at least yearly  -Get your eyes checked and then per your eye specialist's recommendations  -Other issues addressed today:   -I have included below further information regarding a healthy whole foods based diet, physical activity guidelines for adults, stress management and opportunities for social connections. I hope you find this information useful.   -----------------------------------------------------------------------------------------------------------------------------------------------------------------------------------------------------------------------------------------------------------    NUTRITION: -eat real food: lots of colorful vegetables (half the plate) and fruits -5-7 servings of vegetables and fruits per day (fresh or steamed is best), exp. 2 servings of vegetables with lunch and dinner and 2 servings of fruit per day. Berries and greens such as kale and collards are great choices.  -consume on a regular basis:  fresh fruits, fresh veggies, fish, nuts, seeds, healthy oils (such as olive oil, avocado oil), whole grains (make sure for bread/pasta/crackers/etc., that the first ingredient on label contains the word "whole"), legumes. -can eat small amounts of dairy and lean meat (no larger than the palm of your hand), but avoid processed meats such as ham, bacon, lunch meat, etc. -drink water -try to avoid fast food and pre-packaged foods, processed meat, ultra processed  foods/beverages (donuts, candy, etc.) -most experts advise limiting sodium to < 2300mg  per day, should limit further is any chronic conditions such as high blood pressure, heart disease, diabetes, etc. The American Heart Association advised that < 1500mg  is is ideal -try to avoid foods/beverages that contain any ingredients with names you do not recognize  -try to avoid foods/beverages  with added sugar or sweeteners/sweets  -try to avoid sweet drinks (including diet drinks): soda, juice, Gatorade, sweet tea, power drinks, diet drinks -try to avoid white rice, white bread, pasta (unless whole grain)  EXERCISE GUIDELINES FOR ADULTS: -if you wish to increase your physical activity, do so gradually and with the approval of your doctor -STOP and seek medical care immediately if you have any chest pain, chest discomfort or trouble breathing when starting or increasing exercise  -move and stretch your body, legs, feet and arms when sitting for long periods -Physical activity guidelines for optimal health in adults: -get at least 150 minutes per week of moderate exercise (can talk, but not sing); this is about 20-30 minutes of sustained activity 5-7 days per week or two 10-15 minute episodes of sustained activity 5-7 days per week -do some muscle building/resistance training/strength training at least 2 days per week  -balance exercises 3+ days per week:   Stand somewhere where you have something sturdy to hold onto if you lose balance    1) lift up on toes, then back down,  start with 5x per day and work up to 20x   2) stand and lift one leg straight out to the side so that foot is a few inches of the floor, start with 5x each side and work up to 20x each side   3) stand on one foot, start with 5 seconds each side and work up to 20 seconds on each side  If you need ideas or help with getting more active:   -try to include resistance (weight lifting/strength building) and balance exercises twice per  week: or the following link for ideas: http://castillo-powell.com/  BuyDucts.dk  STRESS MANAGEMENT: -can try meditating, or just sitting quietly with deep breathing while intentionally relaxing all parts of your body for 5 minutes daily -if you need further help with stress, anxiety or depression please follow up with your primary doctor or contact the wonderful folks at [facility]: [phone]  SOCIAL CONNECTIONS:   -Virtual Online Classes (a variety of topics): see seniorplanet.org or call [phone]  -also could look online for virtual book clubs or churches that offer online services and virtual meet up groups  -options in [place] if you start to feel better and you wish to engage in more social and exercise related activities in person:  -Silver sneakers https://tools.silversneakers.com  -Walk with a Doc: http://www.duncan-williams.com/  -Check out the [facility] Active Adults 50+ section on the [place] of [employer] (hiking clubs, book clubs, cards and games, chess, exercise classes, aquatic classes and much more) - see the website for details: https://www.[place].gov/departments/parks-recreation/active-adults50  -YouTube has lots of exercise videos for different ages and abilities as well  -[facility] (a variety of indoor and outdoor inperson activities for adults). [phone]. [address]    FOR IMPROVED SLEEP AND TO RESET YOUR SLEEP SCHEDULE:  []  Schedule sleep counseling(cognitive behavioral therapy).  Look into sleep CBT or iCBT or can call   [facility] to ask if they have a therapist who does iCBT   Call for appointment: [phone]   []  Keep bedroom cool, dark and quiet - if you have to get up at night make sure there is no white light from street lamps, night lights, lights, clock, devices etc. that enters  your eyes. Instead, use red light flashlight or night lights and avoid looking directly at the light while up.   []  Set 1-2 hour bedtime routine, dim lights, avoid screens (phones, computers, TVs, etc during this time), consider sleepytime tea, warm bath or shower, avoid alcohol and caffeine  []  Reserve bed for sleep - do not read, watch TV, look at phone or device, etc., in bed.  []  If you toss and turn for more then 10-15 minutes, get out of bed and with red light only, list or journal thoughts or do quiet activity (not screen-time, no tv, phone, computer) then go back to bed. Repeat as needed. Try not to worry about when you will eventually fall asleep.DO NOT look at the clock.  []  work up exercise gradually. Some people do better with exercise in the morning, other do better exercising later in the day.  []  Avoid caffeine and alcohol - particularly in the evenings. For some people even a little alcohol can impair sleep.   []  Go to bed and wake up at the same time everyday (within a 30 minute window). When you get up in the morning for your designated wake time - turn on lights and open curtains right away. This will help to set  your internal sleep clock.   []  Some people find that a half dose of benadryl, melatonin, tylenol pm or unisom on a few nights per week is helpful initially for a few weeks.  []  Seek help for any depression or anxiety.  [] Prescription strength sleep medications should only be used in severe cases of insomnia if other measures fail and should be used sparingly.  I hope you are feeling better soon! Follow up with your doctor in 3-4 weeks or sooner if your symptoms worsen or new concerns arise.          Terressa Koyanagi, DO

## 2023-09-11 NOTE — Patient Instructions (Addendum)
 I really enjoyed getting to talk with you today! I am available on Tuesdays and Thursdays for virtual visits if you have any questions or concerns, or if I can be of any further assistance.   CHECKLIST FROM ANNUAL WELLNESS VISIT:  -Follow up (please call to schedule if not scheduled after visit):   -yearly for annual wellness visit with primary care office  Here is a list of your preventive care/health maintenance measures and the plan for each if any are due:  PLAN For any measures below that may be due:   Health Maintenance  Topic Date Due   Zoster Vaccines- Shingrix (1 of 2) Never done   DTaP/Tdap/Td (2 - Tdap) 07/03/2014   COVID-19 Vaccine (4 - 2024-25 season) 03/04/2023   Medicare Annual Wellness (AWV)  09/10/2024   Pneumonia Vaccine 42+ Years old  Completed   INFLUENZA VACCINE  Completed   DEXA SCAN  Completed   HPV VACCINES  Aged Out    -See a dentist at least yearly  -Get your eyes checked and then per your eye specialist's recommendations  -Other issues addressed today:   -I have included below further information regarding a healthy whole foods based diet, physical activity guidelines for adults, stress management and opportunities for social connections. I hope you find this information useful.   -----------------------------------------------------------------------------------------------------------------------------------------------------------------------------------------------------------------------------------------------------------    NUTRITION: -eat real food: lots of colorful vegetables (half the plate) and fruits -5-7 servings of vegetables and fruits per day (fresh or steamed is best), exp. 2 servings of vegetables with lunch and dinner and 2 servings of fruit per day. Berries and greens such as kale and collards are great choices.  -consume on a regular basis:  fresh fruits, fresh veggies, fish, nuts, seeds, healthy oils (such as olive oil, avocado  oil), whole grains (make sure for bread/pasta/crackers/etc., that the first ingredient on label contains the word "whole"), legumes. -can eat small amounts of dairy and lean meat (no larger than the palm of your hand), but avoid processed meats such as ham, bacon, lunch meat, etc. -drink water -try to avoid fast food and pre-packaged foods, processed meat, ultra processed foods/beverages (donuts, candy, etc.) -most experts advise limiting sodium to < 2300mg  per day, should limit further is any chronic conditions such as high blood pressure, heart disease, diabetes, etc. The American Heart Association advised that < 1500mg  is is ideal -try to avoid foods/beverages that contain any ingredients with names you do not recognize  -try to avoid foods/beverages  with added sugar or sweeteners/sweets  -try to avoid sweet drinks (including diet drinks): soda, juice, Gatorade, sweet tea, power drinks, diet drinks -try to avoid white rice, white bread, pasta (unless whole grain)  EXERCISE GUIDELINES FOR ADULTS: -if you wish to increase your physical activity, do so gradually and with the approval of your doctor -STOP and seek medical care immediately if you have any chest pain, chest discomfort or trouble breathing when starting or increasing exercise  -move and stretch your body, legs, feet and arms when sitting for long periods -Physical activity guidelines for optimal health in adults: -get at least 150 minutes per week of moderate exercise (can talk, but not sing); this is about 20-30 minutes of sustained activity 5-7 days per week or two 10-15 minute episodes of sustained activity 5-7 days per week -do some muscle building/resistance training/strength training at least 2 days per week  -balance exercises 3+ days per week:   Stand somewhere where you have something sturdy to hold onto if  you lose balance    1) lift up on toes, then back down, start with 5x per day and work up to 20x   2) stand and lift  one leg straight out to the side so that foot is a few inches of the floor, start with 5x each side and work up to 20x each side   3) stand on one foot, start with 5 seconds each side and work up to 20 seconds on each side  If you need ideas or help with getting more active:   -try to include resistance (weight lifting/strength building) and balance exercises twice per week: or the following link for ideas: http://castillo-powell.com/  BuyDucts.dk  STRESS MANAGEMENT: -can try meditating, or just sitting quietly with deep breathing while intentionally relaxing all parts of your body for 5 minutes daily -if you need further help with stress, anxiety or depression please follow up with your primary doctor or contact the wonderful folks at [facility]: [phone]  SOCIAL CONNECTIONS:   -Virtual Online Classes (a variety of topics): see seniorplanet.org or call [phone]  -also could look online for virtual book clubs or churches that offer online services and virtual meet up groups  -options in [place] if you start to feel better and you wish to engage in more social and exercise related activities in person:  -Silver sneakers https://tools.silversneakers.com  -Walk with a Doc: http://www.duncan-williams.com/  -Check out the [facility] Active Adults 50+ section on the [place] [employer] (hiking clubs, book clubs, cards and games, chess, exercise classes, aquatic classes and much more) - see the website for details: https://www.[place].gov/departments/parks-recreation/active-adults50  -YouTube has lots of exercise videos for different ages and abilities as well  -[facility] (a variety of indoor and outdoor inperson activities for adults). [phone]. [address]    FOR IMPROVED SLEEP AND TO RESET YOUR SLEEP SCHEDULE:  []  Schedule sleep  counseling(cognitive behavioral therapy).  Look into sleep CBT or iCBT or can call   [facility] to ask if they have a therapist who does iCBT   Call for appointment: [phone]   []  Keep bedroom cool, dark and quiet - if you have to get up at night make sure there is no white light from street lamps, night lights, lights, clock, devices etc. that enters your eyes. Instead, use red light flashlight or night lights and avoid looking directly at the light while up.   []  Set 1-2 hour bedtime routine, dim lights, avoid screens (phones, computers, TVs, etc during this time), consider sleepytime tea, warm bath or shower, avoid alcohol and caffeine  []  Reserve bed for sleep - do not read, watch TV, look at phone or device, etc., in bed.  []  If you toss and turn for more then 10-15 minutes, get out of bed and with red light only, list or journal thoughts or do quiet activity (not screen-time, no tv, phone, computer) then go back to bed. Repeat as needed. Try not to worry about when you will eventually fall asleep.DO NOT look at the clock.  []  work up exercise gradually. Some people do better with exercise in the morning, other do better exercising later in the day.  []  Avoid caffeine and alcohol - particularly in the evenings. For some people even a little alcohol can impair sleep.   []  Go to bed and wake up at the same time everyday (within a 30 minute window). When you get up in the morning for your designated wake time -  turn on lights and open curtains right away. This will help to set your internal sleep clock.   []  Some people find that a half dose of benadryl, melatonin, tylenol pm or unisom on a few nights per week is helpful initially for a few weeks.  []  Seek help for any depression or anxiety.  [] Prescription strength sleep medications should only be used in severe cases of insomnia if other measures fail and should be used sparingly.  I hope you are feeling better  soon! Follow up with your doctor in 3-4 weeks or sooner if your symptoms worsen or new concerns arise.

## 2023-09-12 ENCOUNTER — Ambulatory Visit: Payer: Medicare HMO | Attending: Cardiovascular Disease | Admitting: Cardiovascular Disease

## 2023-09-12 ENCOUNTER — Encounter: Payer: Self-pay | Admitting: Cardiovascular Disease

## 2023-09-12 VITALS — BP 125/73 | HR 71 | Ht 64.0 in | Wt 156.0 lb

## 2023-09-12 DIAGNOSIS — I493 Ventricular premature depolarization: Secondary | ICD-10-CM | POA: Diagnosis not present

## 2023-09-12 NOTE — Patient Instructions (Signed)
 Medication Instructions:  No changes. *If you need a refill on your cardiac medications before your next appointment, please call your pharmacy*   Follow-Up: At [facility], you and your health needs are our priority.  As part of our continuing mission to provide you with exceptional heart care, we have created designated Provider Care Teams.  These Care Teams include your primary Cardiologist (physician) and Advanced Practice Providers (APPs -  Physician Assistants and Nurse Practitioners) who all work together to provide you with the care you need, when you need it.  We recommend signing up for the patient portal called "MyChart".  Sign up information is provided on this After Visit Summary.  MyChart is used to connect with patients for Virtual Visits (Telemedicine).  Patients are able to view lab/test results, encounter notes, upcoming appointments, etc.  Non-urgent messages can be sent to your provider as well.   To learn more about what you can do with MyChart, go to [email or web address].    Your next appointment:    As needed.   Provider:   Thurmon Fair, MD     Other Instructions

## 2023-09-12 NOTE — Progress Notes (Signed)
  Electrophysiology Office Note:   Date:  09/12/2023  ID:  Dawn Harmon, DOB 10-01-1940, MRN [id number]  Primary Cardiologist: None Electrophysiologist: Will Jorja Loa, MD      History of Present Illness:   Dawn Harmon is a 83 y.o. female with h/o CKD, chronic fatigue, PVCs seen today for routine electrophysiology followup.  She does not have any significant structural heart disease by echocardiography.  Previous monitoring has shown burden of PVCs as high as 15%, but treatment with beta-blockers and antiarrhythmics has been poorly tolerated.  Did not improve her fatigue and probably made her symptoms worse. She denies proximal chest pain, syncope, dyspnea with usual activity or focal neurological events.  Her major complaint is very severe fatigue.   Studies Reviewed:      EKG Interpretation Date/Time:    Ventricular Rate:    PR Interval:    QRS Duration:    QT Interval:    QTC Calculation:   R Axis:      Text Interpretation:              Risk Assessment/Calculations:              Physical Exam:   VS:  BP 125/73 (BP Location: Left Arm, Patient Position: Sitting, Cuff Size: Normal)   Pulse 71   Ht 5\' 4"  (1.626 m)   Wt 156 lb (70.8 kg)   SpO2 90%   BMI 26.78 kg/m    Wt Readings from Last 3 Encounters:  09/12/23 156 lb (70.8 kg)  07/18/23 153 lb 12.8 oz (69.8 kg)  07/10/23 156 lb 12.8 oz (71.1 kg)     General: Alert, oriented x3, no distress Head: no evidence of trauma, PERRL, EOMI, no exophtalmos or lid lag, no myxedema, no xanthelasma; normal ears, nose and oropharynx Neck: normal jugular venous pulsations and no hepatojugular reflux; brisk carotid pulses without delay and no carotid bruits Chest: clear to auscultation, no signs of consolidation by percussion or palpation, normal fremitus, symmetrical and full respiratory excursions Cardiovascular: normal position and quality of the apical impulse, regular rhythm baseline with occasional ectopic beats,  normal first and second heart sounds, no murmurs, rubs or gallops Abdomen: no tenderness or distention, no masses by palpation, no abnormal pulsatility or arterial bruits, normal bowel sounds, no hepatosplenomegaly Extremities: no clubbing, cyanosis or edema; 2+ radial, ulnar and brachial pulses bilaterally; 2+ right femoral, posterior tibial and dorsalis pedis pulses; 2+ left femoral, posterior tibial and dorsalis pedis pulses; no subclavian or femoral bruits Neurological: grossly nonfocal Psych: Normal mood and affect   ASSESSMENT AND PLAN:    1.  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  The PVCs are consistently asymptomatic, including today.  Flecainide caused excessive QRS prolongation, amiodarone appeared to suppress PVCs but did not really improve symptoms and may have worsened the fatigue.  Mexiletine was likewise not tolerated.  Beta-blockers clearly worsened symptoms: Metoprolol caused diarrhea.  Propranolol caused worsening fatigue.  Going forward had decided to pursue a conservative approach, without pharmacological therapy unless her symptomatology changes.  Avoid stimulants.  Recommend following up with cardiology on as-needed basis.   Signed, Thurmon Fair, MD

## 2023-09-20 ENCOUNTER — Telehealth (INDEPENDENT_AMBULATORY_CARE_PROVIDER_SITE_OTHER): Admitting: Family Medicine

## 2023-09-20 DIAGNOSIS — R5383 Other fatigue: Secondary | ICD-10-CM

## 2023-09-20 DIAGNOSIS — Z72820 Sleep deprivation: Secondary | ICD-10-CM | POA: Diagnosis not present

## 2023-09-20 DIAGNOSIS — R5381 Other malaise: Secondary | ICD-10-CM | POA: Diagnosis not present

## 2023-09-20 NOTE — Progress Notes (Signed)
 Virtual Visit via Telephone Note  I connected with Dawn Harmon on 09/20/23 at  5:40 PM EDT by telephone and verified that I am speaking with the correct person using two identifiers.   I discussed the limitations of performing an evaluation and management service by telephone and requested permission for a phone visit. The patient expressed understanding and agreed to proceed.  Location patient:  [place] Location provider: work or home office Participants present for the call: patient, provider Patient did not have a visit with me in the prior 7 days to address this/these issue(s).   History of Present Illness:  Acute telemedicine visit for Chronic Fatigue Symptoms: -Onset: over 30 years ago  -she is hoping for some recommendations for lifestyle interventions -she since talking to me at a wellness visit has made some changes in lifestyle and feels has hope for the qst time in years that she may feel somewhat better -has increased water intake to 38 oz of water per days -she has started taking very small walks 5-10 minutes once a day -is trying to get some sunlight in the morning -Symptoms include:chronic fatigue and malaise for many many years, poor sleep, lots of stress around this -she has many questions, particularly about sleep and supplements today and is taking a lot of supplements but does not feel they have been helpful -Pertinent past medical history: see below -Pertinent medication allergies:  Allergies  Allergen Reactions   Bupropion Other (See Comments)    Unknown reaction   Doxepin Hcl Other (See Comments)    Unknown reaction    Duloxetine Hcl Other (See Comments)    Unknown reaction    Hydroxyzine Other (See Comments)    Unknown reaction    Levofloxacin Other (See Comments)    insomnia   Mirtazapine Other (See Comments)    Unknown reaction    Nitrofurantoin Other (See Comments)    Unknown reaction    Temazepam Other (See Comments)    Unknown reaction     Zolpidem Tartrate Other (See Comments)    Unknown reaction    -COVID-19 vaccine status: Immunization History  Administered Date(s) Administered   Influenza Split 04/04/2012   Influenza Whole 07/03/2005, 04/07/2008, 05/07/2009, 03/17/2010   Influenza, High Dose Seasonal PF 04/06/2015   Influenza,inj,Quad PF,6+ Mos 04/16/2013, 04/22/2014   Influenza-Unspecified 05/24/2022, 04/17/2023   Moderna Sars-Covid-2 Vaccination 07/16/2019, 08/13/2019, 06/09/2020   Pneumococcal Conjugate-13 03/05/2014   Pneumococcal Polysaccharide-23 05/03/2006   Td 07/03/2004      Past Medical History:  Diagnosis Date   Chronic fatigue fibromyalgia syndrome    CKD (chronic kidney disease)    Depression    Insomnia    Osteoarthritis    neck   Osteoporosis     Current Outpatient Medications on File Prior to Visit  Medication Sig Dispense Refill   ASHWAGANDHA PO Take 1 Capful by mouth daily at 12 noon.     aspirin EC 81 MG tablet Take 1 tablet (81 mg total) by mouth daily. Swallow whole.     Coenzyme Q10 (COQ10 PO) Take 1 teaspoon by mouth daily     Cranberry 300 MG tablet Take 300 mg by mouth daily.     escitalopram (LEXAPRO) 5 MG tablet Take 1 tablet (5 mg total) by mouth daily. 90 tablet 3   Multiple Vitamin (MULTIVITAMIN) tablet Take 1 tablet by mouth daily.     Multiple Vitamins-Minerals (HAIR SKIN & NAILS PO) Take 2 tablets by mouth daily.     Multiple Vitamins-Minerals (PRESERVISION AREDS  2 PO) Take 1 tablet by mouth 2 (two) times daily.     TURMERIC PO Take 1 capsule by mouth daily.     No current facility-administered medications on file prior to visit.    Observations/Objective: Patient sounds cheerful and well on the phone. I do not appreciate any SOB. Speech and thought processing are grossly intact. Patient reported vitals:  Assessment and Plan:  Malaise and fatigue  Poor sleep   Pt prefers to treat via telemedicine empirically rather than in person at this moment.   Congratulated on behavioral changes made so far and encourage no more than 5-10 minutes of exercise per day for now with very very gradual progression via adding on minutes of walking very slowly and only as tolerating. Reviewed all of her supplements and advised to stop any that she did not find help for now unless prescribed by a doctor. Advised to stop current MV as the one she is taking a multivitamin that did not pass [employer] testing and advised if wishes to take MV to take centrum silver, [place] or One a Day women's. Discussed replacing the cranberry supplement with small glass of 100% cranberry juice mixed with 100% blueberry juice (organic if possible). Discussed sleep at length including the large impact that light has on the Circadian clock and ways to avoid white/blue/yellow light in the 1-2 hours before bed and during night time wakening, sleep hygeine/resetting circadian clock. She wants some detail help with nutrition and has requested to follow up with me. I have asked her to keep a detailed food journal and if she likes she can follow up with me in a few months.  Advise to seek prompt virtual visit or in person care if worsening, new symptoms arise, or if is not improving with treatment as expected per our conversation of expected course. Discussed options for follow up care. Did let this patient know that I do telemedicine on Tuesdays and Thursdays for [place] and those are the days I am logged into the system. Advised to schedule follow up visit with PCP, [place] virtual visits or [facility] if any further questions or concerns to avoid delays in care.   I discussed the assessment and treatment plan with the patient. The patient was provided an opportunity to ask questions and all were answered. The patient agreed with the plan and demonstrated an understanding of the instructions.    Follow Up Instructions:  I did not refer this patient for an OV with me in the next 24 hours for  this/these issue(s).  I discussed the assessment and treatment plan with the patient. The patient was provided an opportunity to ask questions and all were answered. The patient agreed with the plan and demonstrated an understanding of the instructions.   I spent  over 20 minutes minutes on the date of this visit in the care of this patient. See summary of tasks completed to properly care for this patient in the detailed notes above which also included counseling of above, review of PMH, medications, allergies, evaluation of the patient and ordering and/or  instructing patient on testing and care options.     Terressa Koyanagi, DO

## 2023-09-20 NOTE — Patient Instructions (Addendum)
 Exercise:  Congratulations on walking daily! That is amazing. Stick to 5 minutes (no more than 10 minutes) of gentle walking daily for the first 1-2 months. If you are feeling ok with this may slowly increase after that by 2 minutes per week until you are walking 20 minutes per day. And if still feeling ok can gradually increase the speed.   Start looking into chair yoga or exercise classes at recreation center or online. After a few weeks of walking if you are feel ok you can add 5 minutes of chair exercise or yoga 2-3 times per week. Do this for about a month before you increase to doing this daily.  Keep a detailed diet journal of everything you eat and drink for 1 week before your next follow up appointment.  Please stop any supplements not prescribed by a doctor unless you feel that they really helped you.  Stop your current Multivitamin and consider one of the following instead if you wish to take a multivitamin: Centrum Silver, One a Day womans, [place]  Consider 1 small glass of 100% cranberry juice mixed with 100% blueberry juice daily. OR you can add fresh or frozen wild blueberries to a smoothie daily. Organic is better if you can.   Schedule 1-2 social outings per week. Even just a visit to a favorite store. Consider the recreation center classes, church options, virtual options, etc.   FOR IMPROVED SLEEP AND TO RESET YOUR SLEEP SCHEDULE:    []  Avoid caffeine and alcohol - particularly in the evenings. For some people even a little alcohol can impair sleep.   []  Go to bed and wake up at the same time everyday (within a 30 minute window). When you get up in the morning for your designated wake time - turn on lights and open curtains right away. This will help to set your internal sleep clock.   []  Keep bedroom cool, dark and quiet - if you have to get up at night make sure there is no white light from street lamps, night lights, lights, clock, devices etc. that enters your eyes.  Instead, use red light flashlight or night lights and avoid looking directly at the light while up.   []  Set 1-2 hour bedtime routine, dim lights, avoid screens (phones, computers, TVs, etc during this time), consider sleepytime tea, warm bath or shower, avoid alcohol and caffeine  []  Reserve bed for sleep - do not read, watch TV, look at phone or device, etc., in bed.  []  If you toss and turn for more then 10-15 minutes, get out of bed and list or journal thoughts or do quiet activity (not screen-time, no tv, phone, computer) then go back to bed. Repeat as needed. Try not to worry about when you will eventually fall asleep.  []  Some people find that a half dose of benadryl, melatonin, tylenol pm or unisom on a few nights per week is helpful initially for a few weeks.  []  Seek help for any depression or anxiety.  [] Prescription strength sleep medications should only be used in severe cases of insomnia if other measures fail and should be used sparingly.  []  if you can, Schedule sleep counseling(cognitive behavioral therapy).   [facility] is a good option.   Call for appointment: [phone]  I hope you are feeling better soon! Follow up with your doctor in 3-4 weeks or sooner if your symptoms worsen or new concerns arise.

## 2023-10-12 DIAGNOSIS — K080 Exfoliation of teeth due to systemic causes: Secondary | ICD-10-CM | POA: Diagnosis not present

## 2023-11-28 ENCOUNTER — Ambulatory Visit (INDEPENDENT_AMBULATORY_CARE_PROVIDER_SITE_OTHER): Admitting: Family Medicine

## 2023-11-28 ENCOUNTER — Encounter: Payer: Self-pay | Admitting: Family Medicine

## 2023-11-28 VITALS — BP 132/80 | HR 79 | Temp 97.8°F | Wt 155.0 lb

## 2023-11-28 DIAGNOSIS — R300 Dysuria: Secondary | ICD-10-CM | POA: Diagnosis not present

## 2023-11-28 LAB — POC URINALSYSI DIPSTICK (AUTOMATED)
Bilirubin, UA: NEGATIVE
Blood, UA: POSITIVE
Glucose, UA: NEGATIVE
Ketones, UA: NEGATIVE
Nitrite, UA: NEGATIVE
Protein, UA: POSITIVE — AB
Spec Grav, UA: 1.015 (ref 1.010–1.025)
Urobilinogen, UA: 0.2 U/dL
pH, UA: 6 (ref 5.0–8.0)

## 2023-11-28 MED ORDER — CEPHALEXIN 500 MG PO CAPS: 500.0000 mg | ORAL_CAPSULE | Freq: Three times a day (TID) | ORAL | 0 refills | Status: DC

## 2023-11-28 NOTE — Progress Notes (Signed)
 Established Patient Office Visit  Subjective   Patient ID: Dawn Harmon, female    DOB: 06/15/1941  Age: 83 y.o. MRN: [id number]  No chief complaint on file.   HPI   Dawn Harmon is seen with approximately 3-week history of "not feeling".  She does have a history of recurrent UTIs but states this feels slightly different.  She has noted cloudy urine for the past few weeks.  Has had some urine frequency.  No severe burning but has had what she describes as a "tingling "sensation early with urination.  No gross hematuria.  No flank pain.  No fever or chills.  No nausea or vomiting.  She has had multiple UTIs in the past with E. coli UTIs including this past January, September 2024 and almost exactly a year ago at this time  Past Medical History:  Diagnosis Date   Chronic fatigue fibromyalgia syndrome    CKD (chronic kidney disease)    Depression    Insomnia    Osteoarthritis    neck   Osteoporosis    Past Surgical History:  Procedure Laterality Date   BUNIONECTOMY     HERNIA REPAIR     TONSILLECTOMY      reports that she has never smoked. She has never used smokeless tobacco. She reports current alcohol use. She reports that she does not use drugs. family history includes Arthritis in her father; Breast cancer in her mother and sister; Depression in her father; Suicidality in her father. Allergies  Allergen Reactions   Bupropion  Other (See Comments)    Unknown reaction   Doxepin Hcl Other (See Comments)    Unknown reaction    Duloxetine  Hcl Other (See Comments)    Unknown reaction    Hydroxyzine Other (See Comments)    Unknown reaction    Levofloxacin Other (See Comments)    insomnia   Mirtazapine Other (See Comments)    Unknown reaction    Nitrofurantoin  Other (See Comments)    Unknown reaction    Temazepam Other (See Comments)    Unknown reaction    Zolpidem Tartrate Other (See Comments)    Unknown reaction     Review of Systems  Constitutional:  Negative  for chills and fever.  Gastrointestinal:  Negative for abdominal pain, nausea and vomiting.  Genitourinary:  Positive for dysuria and frequency. Negative for flank pain and hematuria.      Objective:      BP 132/80 (BP Location: Left Arm, Patient Position: Sitting, Cuff Size: Normal)   Pulse 79   Temp 97.8 F (36.6 C) (Oral)   Wt 155 lb (70.3 kg)   SpO2 97%   BMI 26.61 kg/m  BP Readings from Last 3 Encounters:  11/28/23 132/80  09/12/23 125/73  07/18/23 128/86   Wt Readings from Last 3 Encounters:  11/28/23 155 lb (70.3 kg)  09/12/23 156 lb (70.8 kg)  07/18/23 153 lb 12.8 oz (69.8 kg)      Physical Exam Vitals reviewed.  Constitutional:      General: She is not in acute distress.    Appearance: She is not ill-appearing or toxic-appearing.  Cardiovascular:     Rate and Rhythm: Normal rate and regular rhythm.  Pulmonary:     Effort: Pulmonary effort is normal.     Breath sounds: Normal breath sounds. No wheezing or rales.  Neurological:     Mental Status: She is alert.      Results for orders placed or performed in visit on  11/28/23  POCT Urinalysis Dipstick (Automated)  Result Value Ref Range   Color, UA Yellow    Clarity, UA Cloudy    Glucose, UA Negative Negative   Bilirubin, UA Negative    Ketones, UA Negative    Spec Grav, UA 1.015 1.010 - 1.025   Blood, UA Positive    pH, UA 6.0 5.0 - 8.0   Protein, UA Positive (A) Negative   Urobilinogen, UA 0.2 0.2 or 1.0 E.U./dL   Nitrite, UA Negative    Leukocytes, UA Large (3+) (A) Negative      The ASCVD Risk score (Arnett DK, et al., 2019) failed to calculate for the following reasons:   The 2019 ASCVD risk score is only valid for ages 72 to 55   Risk score cannot be calculated because patient has a medical history suggesting prior/existing ASCVD    Assessment & Plan:   Problem List Items Addressed This Visit   None Visit Diagnoses       Dysuria    -  Primary   Relevant Orders   POCT Urinalysis  Dipstick (Automated) (Completed)   Urine Culture     Patient relates 3-week history of mild dysuria.  Urine dipstick reveals 3+ leukocytes and positive blood.  Urine culture sent.  Start Keflex  500 mg 3 times daily for 5 days pending culture results.  Stay well-hydrated.  Follow-up immediately for any high fever, recurrent vomiting, flank pain, or other concerns  No follow-ups on file.    Glean Lamy, MD

## 2023-11-29 DIAGNOSIS — K080 Exfoliation of teeth due to systemic causes: Secondary | ICD-10-CM | POA: Diagnosis not present

## 2023-11-29 LAB — URINE CULTURE
MICRO NUMBER:: 16509036
Result:: NO GROWTH
SPECIMEN QUALITY:: ADEQUATE

## 2023-12-02 ENCOUNTER — Ambulatory Visit: Payer: Self-pay | Admitting: Family Medicine

## 2023-12-25 DIAGNOSIS — H353132 Nonexudative age-related macular degeneration, bilateral, intermediate dry stage: Secondary | ICD-10-CM | POA: Diagnosis not present

## 2023-12-25 DIAGNOSIS — Z961 Presence of intraocular lens: Secondary | ICD-10-CM | POA: Diagnosis not present

## 2023-12-25 DIAGNOSIS — H40023 Open angle with borderline findings, high risk, bilateral: Secondary | ICD-10-CM | POA: Diagnosis not present

## 2023-12-25 DIAGNOSIS — H43813 Vitreous degeneration, bilateral: Secondary | ICD-10-CM | POA: Diagnosis not present

## 2024-01-10 ENCOUNTER — Ambulatory Visit: Payer: Self-pay

## 2024-01-10 NOTE — Telephone Encounter (Signed)
 FYI Only or Action Required?: FYI only for provider.  Patient was last seen in primary care on 11/28/2023 by Micheal Wolm ORN, MD.  Called Nurse Triage reporting Diarrhea.  Symptoms began several days ago.  Interventions attempted: OTC medications: Pepto without relief.  Symptoms are: unchanged.  Triage Disposition: See Physician Within 24 Hours  Patient/caregiver understands and will follow disposition?: Yes    Copied from CRM [id number]. Topic: Clinical - Red Word Triage >> Jan 10, 2024  9:57 AM [place] J wrote: [facility] that prompted transfer to Nurse Triage: Severe diarrhea for 10 days now. It will get better and then worse. Reason for Disposition  [1] MODERATE diarrhea (e.g., 4-6 times / day more than normal) AND [2] age > 70 years  Answer Assessment - Initial Assessment Questions 1. DIARRHEA SEVERITY: How bad is the diarrhea? How many more stools have you had in the past 24 hours than normal?      Pt unable to answer - pt reports uncontrollable at 0400 this AM  2. ONSET: When did the diarrhea begin?      X 10 days - Pt reports that it has been going back and forth from letting up back to diarrhea. 3. STOOL DESCRIPTION:  How loose or watery is the diarrhea? What is the stool color? Is there any blood or mucous in the stool?     Brown, watery to brown pieces Last night was just solid brown 4. VOMITING: Are you also vomiting? If Yes, ask: How many times in the past 24 hours?      denies 5. ABDOMEN PAIN: Are you having any abdomen pain? If Yes, ask: What does it feel like? (e.g., crampy, dull, intermittent, constant)      denies 6. ABDOMEN PAIN SEVERITY: If present, ask: How bad is the pain?  (e.g., Scale 1-10; mild, moderate, or severe)     N/a 7. ORAL INTAKE: If vomiting, Have you been able to drink liquids? How much liquids have you had in the past 24 hours?     Yes 8. HYDRATION: Any signs of dehydration? (e.g., dry mouth [not just dry  lips], too weak to stand, dizziness, new weight loss) When did you last urinate?     Denies sx of dehydration 9. EXPOSURE: Have you traveled to a foreign country recently? Have you been exposed to anyone with diarrhea? Could you have eaten any food that was spoiled?     denies 10. ANTIBIOTIC USE: Are you taking antibiotics now or have you taken antibiotics in the past 2 months?       Pt reports UTI quite a while ago - several weeks ago 11. OTHER SYMPTOMS: Do you have any other symptoms? (e.g., fever, blood in stool)       denies 12. PREGNANCY: Is there any chance you are pregnant? When was your last menstrual period?       N/a  Protocols used: [facility]

## 2024-01-10 NOTE — Telephone Encounter (Signed)
 Appt scheduled on 7/11 with Dr Mercer.

## 2024-01-11 ENCOUNTER — Encounter: Payer: Self-pay | Admitting: Family Medicine

## 2024-01-11 ENCOUNTER — Ambulatory Visit (INDEPENDENT_AMBULATORY_CARE_PROVIDER_SITE_OTHER): Admitting: Family Medicine

## 2024-01-11 VITALS — BP 124/82 | HR 63 | Temp 98.3°F | Ht 64.0 in | Wt 155.6 lb

## 2024-01-11 DIAGNOSIS — R195 Other fecal abnormalities: Secondary | ICD-10-CM

## 2024-01-11 DIAGNOSIS — K582 Mixed irritable bowel syndrome: Secondary | ICD-10-CM

## 2024-01-11 NOTE — Patient Instructions (Signed)
 Try over the counter imodium for your symptoms.  If you are still having issues follow up with you PCP.

## 2024-01-11 NOTE — Progress Notes (Signed)
 Established Patient Office Visit   Subjective  Patient ID: Dawn Harmon, female    DOB: August 01, 1940  Age: 83 y.o. MRN: [id number]  Chief Complaint  Patient presents with   Medical Management of Chronic Issues    Diarrhea  Patient accompanied by her husband.  Patient is an 83 year old female followed by Dr. Johnny seen for ongoing concern.  Patient endorses episodes of loose stools x 10-14d.  Patient concerned that taking antibiotics at the end of May for UTI is contributing to symptoms.  Having 1-2 episodes of loose stools per day.  Also endorses flatus.  Tried Pepto-Bismol.  No loose stools today.  Per chart review patient with history of IBS mixed.  Patient states she was unaware of this dx. endorses increased stress with family and her husband's health.    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 06/01/2015   Dermatochalasis of both upper eyelids 06/01/2015   Dry eye syndrome, bilateral 06/01/2015   Epiretinal membrane (ERM) of left eye 06/01/2015   Hyperopia of both eyes with astigmatism and presbyopia 06/01/2015   Nuclear sclerosis, left 06/01/2015   Posterior vitreous detachment of right eye 06/01/2015   Squamous blepharitis of upper and lower eyelids of both eyes 06/01/2015   Chronic fatigue syndrome 09/15/2014   Frequent PVCs 04/22/2014   NSVT (nonsustained ventricular tachycardia) (HCC) 04/22/2014   Osteoarthritis  03/05/2014   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 05/22/2012   Cervical radiculopathy 03/10/2011   Past Medical History:  Diagnosis Date   Chronic fatigue fibromyalgia syndrome    CKD (chronic kidney disease)    Depression    Insomnia    Osteoarthritis    neck   Osteoporosis    Past Surgical History:  Procedure Laterality Date   BUNIONECTOMY     HERNIA REPAIR     TONSILLECTOMY     Social History   Tobacco Use   Smoking status: Never   Smokeless tobacco: Never  Vaping Use   Vaping status: Never Used  Substance Use Topics   Alcohol use: Yes    Comment: May have one glass of wine   Drug use: No   Family History  Problem Relation Age of Onset   Breast cancer Mother    Suicidality Father    Arthritis Father    Depression Father    Breast cancer Sister    Allergies  Allergen Reactions   Bupropion  Other (See Comments)    Unknown reaction   Doxepin Hcl Other (See Comments)    Unknown reaction    Duloxetine  Hcl Other (See Comments)    Unknown reaction    Hydroxyzine Other (See Comments)    Unknown reaction    Levofloxacin Other (See Comments)    insomnia   Mirtazapine Other (See Comments)    Unknown reaction    Nitrofurantoin  Other (See Comments)    Unknown reaction    Temazepam Other (See Comments)    Unknown reaction    Zolpidem Tartrate Other (See Comments)  Unknown reaction     ROS Negative unless stated above    Objective:     There were no vitals taken for this visit. BP Readings from Last 3 Encounters:  11/28/23 132/80  09/12/23 125/73  07/18/23 128/86   Wt Readings from Last 3 Encounters:  11/28/23 155 lb (70.3 kg)  09/12/23 156 lb (70.8 kg)  07/18/23 153 lb 12.8 oz (69.8 kg)      Physical Exam Constitutional:      General: She is not in acute distress.    Appearance: Normal appearance.  HENT:     Head: Normocephalic and atraumatic.     Nose: Nose normal.     Mouth/Throat:     Mouth: Mucous membranes are moist.   Cardiovascular:     Rate and Rhythm: Normal rate and regular rhythm.     Heart sounds: Normal heart sounds. No murmur heard.    No gallop.  Pulmonary:     Effort: Pulmonary effort is normal. No respiratory distress.     Breath sounds: Normal breath sounds. No wheezing, rhonchi or rales.  Abdominal:     General: Bowel sounds are normal.     Palpations: Abdomen is soft.     Tenderness: There is no abdominal tenderness. There is no guarding or rebound.  Skin:    General: Skin is warm and dry.  Neurological:     Mental Status: She is alert and oriented to person, place, and time.     No results found for any visits on 01/11/24.    Assessment & Plan:   Loose stools  Irritable bowel syndrome with both constipation and diarrhea  Intermittent episodes of loose stools likely 2/2 pt history of IBS mixed.  Discussed diagnosis with patient as she seems unaware despite it being on her med list.  Advised stress can make symptoms worse.  Consider food diary.  Can use OTC Imodium if needed.  For continued or worsening symptoms follow-up with PCP and consider GI referral.  Return if symptoms worsen or fail to improve.   Clotilda JONELLE Single, MD

## 2024-01-21 ENCOUNTER — Encounter: Payer: Self-pay | Admitting: Family Medicine

## 2024-01-21 ENCOUNTER — Ambulatory Visit: Admitting: Family Medicine

## 2024-01-21 VITALS — BP 126/70 | HR 71 | Temp 97.7°F | Wt 156.0 lb

## 2024-01-21 DIAGNOSIS — R319 Hematuria, unspecified: Secondary | ICD-10-CM | POA: Diagnosis not present

## 2024-01-21 DIAGNOSIS — R300 Dysuria: Secondary | ICD-10-CM | POA: Diagnosis not present

## 2024-01-21 DIAGNOSIS — N390 Urinary tract infection, site not specified: Secondary | ICD-10-CM

## 2024-01-21 LAB — POC URINALSYSI DIPSTICK (AUTOMATED)
Bilirubin, UA: NEGATIVE
Glucose, UA: NEGATIVE
Ketones, UA: NEGATIVE
Protein, UA: NEGATIVE
Spec Grav, UA: 1.01 (ref 1.010–1.025)
Urobilinogen, UA: 0.2 U/dL
pH, UA: 6.5 (ref 5.0–8.0)

## 2024-01-21 MED ORDER — SULFAMETHOXAZOLE-TRIMETHOPRIM 800-160 MG PO TABS: 1.0000 | ORAL_TABLET | Freq: Two times a day (BID) | ORAL | 0 refills | Status: DC

## 2024-01-21 NOTE — Progress Notes (Signed)
   Subjective:    Patient ID: Dawn Harmon, female    DOB: 1940-11-12, 83 y.o.   MRN: [id number]  HPI Here for a week of urgency to urinate and passing cloudy urine. No burning or fever. She drinks plenty of water. She was here on 11-28-23 for similar symptoms. The urein culture showed no growth. She too a week of Cephalexin , and she felt better until recently.    Review of Systems  Constitutional: Negative.   Respiratory: Negative.    Cardiovascular: Negative.   Gastrointestinal: Negative.   Genitourinary:  Positive for frequency and urgency. Negative for flank pain and hematuria.       Objective:   Physical Exam Constitutional:      Appearance: Normal appearance.  Cardiovascular:     Rate and Rhythm: Normal rate and regular rhythm.     Pulses: Normal pulses.     Heart sounds: Normal heart sounds.  Pulmonary:     Effort: Pulmonary effort is normal.     Breath sounds: Normal breath sounds.  Abdominal:     Tenderness: There is no right CVA tenderness or left CVA tenderness.  Neurological:     Mental Status: She is alert.           Assessment & Plan:  UTI, treat with 7 days of Bactrim  DS. Culture the sample.  Garnette Olmsted, MD

## 2024-01-24 LAB — URINE CULTURE
MICRO NUMBER:: 16724643
SPECIMEN QUALITY:: ADEQUATE

## 2024-01-29 ENCOUNTER — Ambulatory Visit: Payer: Self-pay | Admitting: Family Medicine

## 2024-02-26 ENCOUNTER — Ambulatory Visit: Payer: Self-pay | Admitting: Family Medicine

## 2024-02-26 NOTE — Telephone Encounter (Signed)
 FYI Only or Action Required?: FYI only for provider.  Patient was last seen in primary care on 01/21/2024 by Johnny Garnette LABOR, MD.  Called Nurse Triage reporting Urinary Frequency.  Symptoms began yesterday.  Symptoms are: unchanged.  Triage Disposition: See Physician Within 24 Hours  Patient/caregiver understands and will follow disposition?: Yes      Copied from CRM #[id number]. Topic: Clinical - Pink Word Triage >> Feb 26, 2024  8:47 AM [place] E wrote: Reason for Triage: Potential UTI. Patient has been having increased frequency to void as well as increased urgency. Patient stated she does not have any pain. Symptoms have been going on since yesterday morning. Reason for Disposition  Urinating more frequently than usual (i.e., frequency) OR new-onset of the feeling of an urgent need to urinate (i.e., urgency)  Answer Assessment - Initial Assessment Questions SYMPTOM: What's the main symptom you're concerned about? (e.g., frequency, incontinence)     Urinary frequency and urgency  ONSET: When did the  symptoms  start?     Yesterday  PAIN: Is there any pain? If Yes, ask: How bad is it? (Scale: 1-10; mild, moderate, severe)     No  CAUSE: What do you think is causing the symptoms?     UTI  OTHER SYMPTOMS: Do you have any other symptoms? (e.g., blood in urine, fever, flank pain, pain with urination)     No  Protocols used: Urinary Symptoms-A-AH

## 2024-02-26 NOTE — Telephone Encounter (Signed)
 Patient has appt 8/27

## 2024-02-27 ENCOUNTER — Encounter: Payer: Self-pay | Admitting: Family Medicine

## 2024-02-27 ENCOUNTER — Ambulatory Visit (INDEPENDENT_AMBULATORY_CARE_PROVIDER_SITE_OTHER): Admitting: Family Medicine

## 2024-02-27 VITALS — BP 134/90 | HR 72 | Temp 97.8°F | Wt 155.9 lb

## 2024-02-27 DIAGNOSIS — R300 Dysuria: Secondary | ICD-10-CM

## 2024-02-27 DIAGNOSIS — R350 Frequency of micturition: Secondary | ICD-10-CM

## 2024-02-27 LAB — POC URINALSYSI DIPSTICK (AUTOMATED)
Bilirubin, UA: NEGATIVE
Blood, UA: POSITIVE
Glucose, UA: NEGATIVE
Ketones, UA: NEGATIVE
Nitrite, UA: POSITIVE
Protein, UA: NEGATIVE
Spec Grav, UA: 1.015 (ref 1.010–1.025)
Urobilinogen, UA: 0.2 U/dL
pH, UA: 6 (ref 5.0–8.0)

## 2024-02-27 MED ORDER — SULFAMETHOXAZOLE-TRIMETHOPRIM 800-160 MG PO TABS: 1.0000 | ORAL_TABLET | Freq: Two times a day (BID) | ORAL | 0 refills | Status: DC

## 2024-02-27 NOTE — Progress Notes (Signed)
 Established Patient Office Visit  Subjective   Patient ID: Dawn Harmon, female    DOB: 05/25/1941  Age: 83 y.o. MRN: [id number]  Chief Complaint  Patient presents with   Urinary Frequency    HPI   Dawn Harmon seen with 2-day history of urine frequency.  Denies any actual burning with urination.  No fever.  She was here in May with similar symptoms and urine culture that came back negative.  She was seen again in June by her primary and grew out staph species and was treated with Septra  DS.  Denies any gross hematuria.  No flank pain.  No nausea or vomiting.  She has multiple drug intolerances including Levaquin and Macrobid .  Currently on Pyridium  Past Medical History:  Diagnosis Date   Chronic fatigue fibromyalgia syndrome    CKD (chronic kidney disease)    Depression    Insomnia    Osteoarthritis    neck   Osteoporosis    Past Surgical History:  Procedure Laterality Date   BUNIONECTOMY     HERNIA REPAIR     TONSILLECTOMY      reports that she has never smoked. She has never used smokeless tobacco. She reports current alcohol use. She reports that she does not use drugs. family history includes Arthritis in her father; Breast cancer in her mother and sister; Depression in her father; Suicidality in her father. Allergies  Allergen Reactions   Bupropion  Other (See Comments)    Unknown reaction   Doxepin Hcl Other (See Comments)    Unknown reaction    Duloxetine  Hcl Other (See Comments)    Unknown reaction    Hydroxyzine Other (See Comments)    Unknown reaction    Levofloxacin Other (See Comments)    insomnia   Mirtazapine Other (See Comments)    Unknown reaction    Nitrofurantoin  Other (See Comments)    Unknown reaction    Temazepam Other (See Comments)    Unknown reaction    Zolpidem Tartrate Other (See Comments)    Unknown reaction     Review of Systems  Constitutional:  Negative for chills and fever.  Genitourinary:  Positive for frequency and  urgency. Negative for flank pain and hematuria.      Objective:     BP (!) 134/90   Pulse 72   Temp 97.8 F (36.6 C) (Oral)   Wt 155 lb 14.4 oz (70.7 kg)   SpO2 94%   BMI 26.76 kg/m  BP Readings from Last 3 Encounters:  02/27/24 (!) 134/90  01/21/24 126/70  01/11/24 124/82   Wt Readings from Last 3 Encounters:  02/27/24 155 lb 14.4 oz (70.7 kg)  01/21/24 156 lb (70.8 kg)  01/11/24 155 lb 9.6 oz (70.6 kg)      Physical Exam Vitals reviewed.  Constitutional:      General: She is not in acute distress.    Appearance: She is not ill-appearing.  Cardiovascular:     Rate and Rhythm: Normal rate and regular rhythm.  Pulmonary:     Effort: Pulmonary effort is normal.     Breath sounds: Normal breath sounds.  Neurological:     Mental Status: She is alert.      Results for orders placed or performed in visit on 02/27/24  POCT Urinalysis Dipstick (Automated)  Result Value Ref Range   Color, UA Orange    Clarity, UA Cloudy    Glucose, UA Negative Negative   Bilirubin, UA Negative  Ketones, UA Negative    Spec Grav, UA 1.015 1.010 - 1.025   Blood, UA Positive    pH, UA 6.0 5.0 - 8.0   Protein, UA Negative Negative   Urobilinogen, UA 0.2 0.2 or 1.0 E.U./dL   Nitrite, UA Positive    Leukocytes, UA Large (3+) (A) Negative      The ASCVD Risk score (Arnett DK, et al., 2019) failed to calculate for the following reasons:   The 2019 ASCVD risk score is only valid for ages 13 to 27   Risk score cannot be calculated because patient has a medical history suggesting prior/existing ASCVD    Assessment & Plan:   Problem List Items Addressed This Visit   None Visit Diagnoses       Dysuria    -  Primary   Relevant Orders   POCT Urinalysis Dipstick (Automated) (Completed)   Urine Culture     Urine frequency         Seen with 2-day history of urine frequency.  She has had similar symptoms frequently in the past.  No actual burning with urination and no fever.   Urine dipstick today positive for several things but she is on Pyridium which complicates.  Will send culture.  Stay well-hydrated.  Elected to cover with Septra  DS 1 twice daily 5 days pending culture results.  Follow-up immediately for any fever or worsening symptoms.  If culture negative suspect she is having more urine urgency.  We did discuss potential treatment options including Gemtesa or Myrbetriq but she is not interested at this time.  No follow-ups on file.    Wolm Scarlet, MD

## 2024-02-29 ENCOUNTER — Ambulatory Visit: Payer: Self-pay | Admitting: Family Medicine

## 2024-02-29 LAB — URINE CULTURE
MICRO NUMBER:: 16890683
SPECIMEN QUALITY:: ADEQUATE

## 2024-03-04 DIAGNOSIS — D225 Melanocytic nevi of trunk: Secondary | ICD-10-CM | POA: Diagnosis not present

## 2024-03-04 DIAGNOSIS — L814 Other melanin hyperpigmentation: Secondary | ICD-10-CM | POA: Diagnosis not present

## 2024-03-04 DIAGNOSIS — L821 Other seborrheic keratosis: Secondary | ICD-10-CM | POA: Diagnosis not present

## 2024-03-09 ENCOUNTER — Other Ambulatory Visit: Payer: Self-pay | Admitting: Family Medicine

## 2024-03-20 ENCOUNTER — Telehealth: Payer: Self-pay | Admitting: Family Medicine

## 2024-03-20 DIAGNOSIS — N390 Urinary tract infection, site not specified: Secondary | ICD-10-CM

## 2024-03-20 MED ORDER — SULFAMETHOXAZOLE-TRIMETHOPRIM 800-160 MG PO TABS: 1.0000 | ORAL_TABLET | Freq: Two times a day (BID) | ORAL | 0 refills | Status: DC

## 2024-03-20 NOTE — Telephone Encounter (Signed)
 She has had 2 days of urinary urgency and burning. No fever. She was recently treated for an E coli UTI with Bactrim  DS. This seemed to work for several weeks. We will treat her with 7 more days of Bactrim  DS, and we will refer her to [facility] for recurrent UTI's

## 2024-04-19 ENCOUNTER — Ambulatory Visit
Admission: EM | Admit: 2024-04-19 | Discharge: 2024-04-19 | Disposition: A | Discharge status: Home / Self Care | Attending: Nurse Practitioner | Admitting: Nurse Practitioner

## 2024-04-19 DIAGNOSIS — Z87440 Personal history of urinary (tract) infections: Secondary | ICD-10-CM | POA: Insufficient documentation

## 2024-04-19 DIAGNOSIS — N3000 Acute cystitis without hematuria: Secondary | ICD-10-CM | POA: Insufficient documentation

## 2024-04-19 LAB — POCT URINE DIPSTICK
Bilirubin, UA: NEGATIVE
Glucose, UA: 100 mg/dL — AB
Ketones, POC UA: NEGATIVE mg/dL
Nitrite, UA: POSITIVE — AB
POC PROTEIN,UA: >=300 — AB
Spec Grav, UA: 1.015 (ref 1.010–1.025)
Urobilinogen, UA: 1 U/dL
pH, UA: 6 (ref 5.0–8.0)

## 2024-04-19 MED ORDER — CEPHALEXIN 500 MG PO CAPS: 500.0000 mg | ORAL_CAPSULE | Freq: Four times a day (QID) | ORAL | 0 refills | Status: DC

## 2024-04-19 NOTE — ED Triage Notes (Signed)
 Pt being seen in UC for concerns of UTI, burning with urination and urinary frequency.  Pt denies fevers and n/v.  Pt reports taking otc medication with no relief.

## 2024-04-19 NOTE — Discharge Instructions (Addendum)
 You do have a urinary tract infection.  A urine culture has been ordered.  You will be contacted when the results of the urine culture are received.  You will also have access to results via MyChart. Take medication as prescribed. You may take over-the-counter Tylenol as needed for pain, fever, or general discomfort. Increase your fluid intake.  Try to drink at least 5-7 8 ounce glasses of water while symptoms persist. Develop a toileting schedule that will allow you to urinate at least every 2 hours. Avoid caffeine such as tea, soda, or coffee while symptoms persist. Go to the emergency department if you experience worsening urinary symptoms with new symptoms of fever, chills, or pain around your kidneys. Please follow-up with your primary care office next week to gather more information regarding the referral that was placed. Follow-up as needed.

## 2024-04-19 NOTE — ED Provider Notes (Signed)
 [facility]    CSN: [id number] Arrival date & time: 04/19/24  1309      History   Chief Complaint Chief Complaint  Patient presents with   Dysuria    HPI Dawn Harmon is a 83 y.o. female.   The history is provided by the patient.   Patient presents for complaints of urinary frequency, urgency, and bladder spasm.  Patient states symptoms started over the past 24 hours.  She denies fever, chills, dysuria, hematuria, flank pain, or low back pain.  Patient reports prior history of recurrent urinary tract infections.  States her last UTI was less than 1 month ago.  Per review of her chart, referral has been placed for patient to see [facility].  Patient states I have not heard anything back.  Past Medical History:  Diagnosis Date   Chronic fatigue fibromyalgia syndrome    CKD (chronic kidney disease)    Depression    Insomnia    Osteoarthritis    neck   Osteoporosis     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 06/01/2015   Dermatochalasis of both upper eyelids 06/01/2015   Dry eye syndrome, bilateral 06/01/2015   Epiretinal membrane (ERM) of left eye 06/01/2015   Hyperopia of both eyes with astigmatism and presbyopia 06/01/2015   Nuclear sclerosis, left 06/01/2015   Posterior vitreous detachment of right eye 06/01/2015   Squamous blepharitis of upper and lower eyelids of both eyes 06/01/2015    Chronic fatigue syndrome 09/15/2014   Frequent PVCs 04/22/2014   NSVT (nonsustained ventricular tachycardia) (HCC) 04/22/2014   Osteoarthritis 03/05/2014   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      Home Medications    Prior to Admission medications   Medication Sig Start Date End Date Taking? Authorizing Provider  ASHWAGANDHA PO Take 1 Capful by mouth daily at 12 noon.    [provider]  aspirin  EC 81 MG tablet Take 1 tablet (81 mg total) by mouth daily. Swallow whole. 05/15/22   Jadine Toribio SQUIBB, MD  Coenzyme Q10 (COQ10 PO) Take 1 teaspoon by mouth daily    [provider]  Cranberry 300 MG tablet Take 300 mg by mouth daily.    [provider]  escitalopram  (LEXAPRO ) 5 MG tablet TAKE ONE TABLET BY MOUTH ONCE A DAY 03/10/24   Johnny Garnette LABOR, MD  Multiple Vitamin (MULTIVITAMIN) tablet Take 1 tablet by mouth daily.    [provider]  Multiple Vitamins-Minerals (HAIR SKIN & NAILS PO) Take 2 tablets by mouth daily.    [provider]  Multiple Vitamins-Minerals (PRESERVISION AREDS 2 PO) Take 1 tablet by mouth 2 (two) times daily.    [provider]  sulfamethoxazole -trimethoprim  (BACTRIM  DS) 800-160  MG tablet Take 1 tablet by mouth 2 (two) times daily. Patient not taking: Reported on 04/19/2024 03/20/24   Johnny Garnette LABOR, MD  TURMERIC PO Take 1 capsule by mouth daily.    [provider]    Family History Family History  Problem Relation Age of Onset   Breast cancer Mother    Suicidality Father    Arthritis Father    Depression Father    Breast cancer Sister     Social History Social History   Tobacco Use   Smoking status: Never   Smokeless tobacco: Never  Vaping Use   Vaping status: Never Used  Substance  Use Topics   Alcohol use: Yes    Comment: May have one glass of wine   Drug use: No     Allergies   Bupropion , Doxepin hcl, Duloxetine  hcl, Hydroxyzine, Levofloxacin, Mirtazapine, Nitrofurantoin , Temazepam, and Zolpidem tartrate   Review of Systems Review of Systems Per HPI  Physical Exam Triage Vital Signs ED Triage Vitals  Encounter Vitals Group     BP 04/19/24 1328 115/79     Girls Systolic BP Percentile --      Girls Diastolic BP Percentile --      Boys Systolic BP Percentile --      Boys Diastolic BP Percentile --      Pulse Rate 04/19/24 1328 82     Resp 04/19/24 1328 19     Temp 04/19/24 1328 97.8 F (36.6 C)     Temp Source 04/19/24 1328 Oral     SpO2 04/19/24 1328 95 %     Weight --      Height --      Head Circumference --      Peak Flow --      Pain Score 04/19/24 1329 0     Pain Loc --      Pain Education --      Exclude from Growth Chart --    No data found.  Updated Vital Signs BP 115/79 (BP Location: Right Arm)   Pulse 82   Temp 97.8 F (36.6 C) (Oral)   Resp 19   SpO2 95%   Visual Acuity Right Eye Distance:   Left Eye Distance:   Bilateral Distance:    Right Eye Near:   Left Eye Near:    Bilateral Near:     Physical Exam Vitals and nursing note reviewed.  Constitutional:      General: She is not in acute distress.    Appearance: Normal appearance.  HENT:     Head: Normocephalic.  Eyes:     Extraocular Movements: Extraocular movements intact.     Pupils: Pupils are equal, round, and reactive to light.  Cardiovascular:     Rate and Rhythm: Normal rate and regular rhythm.     Pulses: Normal pulses.     Heart sounds: Normal heart sounds.  Pulmonary:     Effort: Pulmonary effort is normal. No respiratory distress.     Breath sounds: Normal breath sounds. No stridor. No wheezing, rhonchi or rales.  Abdominal:     General: Bowel sounds are normal.     Palpations: Abdomen is soft.     Tenderness: There is no abdominal tenderness.  There is no right CVA tenderness or left CVA tenderness.  Musculoskeletal:     Cervical back: Normal range of motion.  Skin:    General: Skin is warm and dry.  Neurological:     General: No focal deficit present.  Mental Status: She is alert and oriented to person, place, and time.  Psychiatric:        Mood and Affect: Mood normal.        Behavior: Behavior normal.      UC Treatments / Results  Labs (all labs ordered are listed, but only abnormal results are displayed) Labs Reviewed  POCT URINE DIPSTICK - Abnormal; Notable for the following components:      Result Value   Color, UA orange (*)    Glucose, UA =100 (*)    Blood, UA moderate (*)    POC PROTEIN,UA >=300 (*)    Nitrite, UA Positive (*)    Leukocytes, UA Large (3+) (*)    All other components within normal limits    EKG   Radiology No results found.  Procedures Procedures (including critical care time)  Medications Ordered in UC Medications - No data to display  Initial Impression / Assessment and Plan / UC Course  I have reviewed the triage vital signs and the nursing notes.  Pertinent labs & imaging results that were available during my care of the patient were reviewed by me and considered in my medical decision making (see chart for details).  Urinalysis positive for leukocytes, nitrites, protein, and blood, consistent with acute cystitis.  Urine culture is pending.  Will treat patient with Keflex  500 mg.  Supportive care recommendations were provided and discussed with the patient to include fluids, over-the-counter analgesics such as Tylenol, developing a toileting schedule, and avoiding caffeine.  Patient was given strict ER follow-up precautions.  Patient urged to follow-up with her PCP office regarding information for her referral.  Patient was in agreement with this plan of care and verbalizes understanding.  All questions were answered.  Patient stable for discharge.  Final Clinical  Impressions(s) / UC Diagnoses   Final diagnoses:  Acute cystitis without hematuria   Discharge Instructions   None    ED Prescriptions   None    PDMP not reviewed this encounter.   Gilmer Etta PARAS, NP 04/19/24 1408

## 2024-04-20 LAB — URINE CULTURE: Culture: <10000 — AB

## 2024-04-21 ENCOUNTER — Ambulatory Visit (HOSPITAL_COMMUNITY): Payer: Self-pay

## 2024-04-25 ENCOUNTER — Encounter: Payer: Self-pay | Admitting: Family Medicine

## 2024-04-25 ENCOUNTER — Ambulatory Visit: Admitting: Family Medicine

## 2024-04-25 VITALS — BP 128/84 | HR 66 | Temp 98.2°F | Ht 64.0 in | Wt 150.0 lb

## 2024-04-25 DIAGNOSIS — E538 Deficiency of other specified B group vitamins: Secondary | ICD-10-CM

## 2024-04-25 DIAGNOSIS — N1831 Chronic kidney disease, stage 3a: Secondary | ICD-10-CM | POA: Diagnosis not present

## 2024-04-25 DIAGNOSIS — E785 Hyperlipidemia, unspecified: Secondary | ICD-10-CM | POA: Diagnosis not present

## 2024-04-25 DIAGNOSIS — N390 Urinary tract infection, site not specified: Secondary | ICD-10-CM

## 2024-04-25 DIAGNOSIS — M150 Primary generalized (osteo)arthritis: Secondary | ICD-10-CM

## 2024-04-25 DIAGNOSIS — F331 Major depressive disorder, recurrent, moderate: Secondary | ICD-10-CM | POA: Diagnosis not present

## 2024-04-25 DIAGNOSIS — R739 Hyperglycemia, unspecified: Secondary | ICD-10-CM

## 2024-04-25 NOTE — Progress Notes (Signed)
 Subjective:    Patient ID: Dawn Harmon, female    DOB: 1940-07-06, 83 y.o.   MRN: [id number]  HPI Here to follow up on issues. She went to urgent care on 04-19-24 with symptoms typical of a UTI, and she was started on 7 days of Keflex . Her urine culture had insignificant growth. Her urinary symptoms have resolved. She is still waiting to be contacted by Dr. Janit' office about the Urology referral we placed last month. Her depression is doing fairly well, and she is pleased with the Lexapro . Her OA is stable. She sees Dr. Kruska for her CKD.    Review of Systems  Constitutional: Negative.   HENT: Negative.    Eyes: Negative.   Respiratory: Negative.    Cardiovascular: Negative.   Gastrointestinal: Negative.   Genitourinary:  Negative for decreased urine volume, difficulty urinating, dyspareunia, dysuria, enuresis, flank pain, frequency, hematuria, pelvic pain and urgency.  Musculoskeletal:  Positive for arthralgias.  Skin: Negative.   Neurological: Negative.  Negative for headaches.  Psychiatric/Behavioral: Negative.         Objective:   Physical Exam Constitutional:      General: She is not in acute distress.    Appearance: Normal appearance. She is well-developed.  HENT:     Head: Normocephalic and atraumatic.     Right Ear: External ear normal.     Left Ear: External ear normal.     Nose: Nose normal.     Mouth/Throat:     Pharynx: No oropharyngeal exudate.  Eyes:     General: No scleral icterus.    Conjunctiva/sclera: Conjunctivae normal.     Pupils: Pupils are equal, round, and reactive to light.  Neck:     Thyroid : No thyromegaly.     Vascular: No JVD.  Cardiovascular:     Rate and Rhythm: Normal rate and regular rhythm.     Pulses: Normal pulses.     Heart sounds: Normal heart sounds. No murmur heard.    No friction rub. No gallop.  Pulmonary:     Effort: Pulmonary effort is normal. No respiratory distress.     Breath sounds: Normal breath sounds. No  wheezing or rales.  Chest:     Chest wall: No tenderness.  Abdominal:     General: Bowel sounds are normal. There is no distension.     Palpations: Abdomen is soft. There is no mass.     Tenderness: There is no abdominal tenderness. There is no guarding or rebound.  Musculoskeletal:        General: No tenderness. Normal range of motion.     Cervical back: Normal range of motion and neck supple.  Lymphadenopathy:     Cervical: No cervical adenopathy.  Skin:    General: Skin is warm and dry.     Findings: No erythema or rash.  Neurological:     General: No focal deficit present.     Mental Status: She is alert and oriented to person, place, and time.     Cranial Nerves: No cranial nerve deficit.     Motor: No abnormal muscle tone.     Coordination: Coordination normal.     Deep Tendon Reflexes: Reflexes are normal and symmetric. Reflexes normal.  Psychiatric:        Mood and Affect: Mood normal.        Behavior: Behavior normal.        Thought Content: Thought content normal.        Judgment: Judgment  normal.           Assessment & Plan:  She seems to have recovered from her recent UTI. Hopefully she can see Urology soon about her recurrent UTI's. Her depression is fairly well controlled. Her OA is stable. Her stage 3a CKD is stable. We will get labs to check lipids, etc. I personally spent a total of 35 minutes in the care of the patient today including getting/reviewing separately obtained history, performing a medically appropriate exam/evaluation, and coordinating care.  Garnette Olmsted, MD

## 2024-04-26 LAB — CBC WITH DIFFERENTIAL/PLATELET
Absolute Lymphocytes: 1618 {cells}/uL (ref 850–3900)
Absolute Monocytes: 353 {cells}/uL (ref 200–950)
Basophils Absolute: 39 {cells}/uL (ref 0–200)
Basophils Relative: 0.7 %
Eosinophils Absolute: 151 {cells}/uL (ref 15–500)
Eosinophils Relative: 2.7 %
HCT: 43 % (ref 35.0–45.0)
Hemoglobin: 14.3 g/dL (ref 11.7–15.5)
MCH: 31.4 pg (ref 27.0–33.0)
MCHC: 33.3 g/dL (ref 32.0–36.0)
MCV: 94.3 fL (ref 80.0–100.0)
MPV: 11.1 fL (ref 7.5–12.5)
Monocytes Relative: 6.3 %
Neutro Abs: 3438 {cells}/uL (ref 1500–7800)
Neutrophils Relative %: 61.4 %
Platelets: 415 Thousand/uL — ABNORMAL HIGH (ref 140–400)
RBC: 4.56 Million/uL (ref 3.80–5.10)
RDW: 13 % (ref 11.0–15.0)
Total Lymphocyte: 28.9 %
WBC: 5.6 Thousand/uL (ref 3.8–10.8)

## 2024-04-26 LAB — HEPATIC FUNCTION PANEL
AG Ratio: 1.7 (calc) (ref 1.0–2.5)
ALT: 15 U/L (ref 6–29)
AST: 24 U/L (ref 10–35)
Albumin: 4.6 g/dL (ref 3.6–5.1)
Alkaline phosphatase (APISO): 70 U/L (ref 37–153)
Bilirubin, Direct: 0.1 mg/dL (ref 0.0–0.2)
Globulin: 2.7 g/dL (ref 1.9–3.7)
Indirect Bilirubin: 0.5 mg/dL (ref 0.2–1.2)
Total Bilirubin: 0.6 mg/dL (ref 0.2–1.2)
Total Protein: 7.3 g/dL (ref 6.1–8.1)

## 2024-04-26 LAB — HEMOGLOBIN A1C
Hgb A1c MFr Bld: 5.5 % (ref <5.7)
Mean Plasma Glucose: 111 mg/dL
eAG (mmol/L): 6.2 mmol/L

## 2024-04-26 LAB — LIPID PANEL
Cholesterol: 229 mg/dL — ABNORMAL HIGH (ref <200)
HDL: 83 mg/dL (ref >=50)
LDL Cholesterol (Calc): 128 mg/dL — ABNORMAL HIGH
Non-HDL Cholesterol (Calc): 146 mg/dL — ABNORMAL HIGH (ref <130)
Total CHOL/HDL Ratio: 2.8 (calc) (ref <5.0)
Triglycerides: 79 mg/dL (ref <150)

## 2024-04-26 LAB — BASIC METABOLIC PANEL WITH GFR
BUN/Creatinine Ratio: 15 (calc) (ref 6–22)
BUN: 15 mg/dL (ref 7–25)
CO2: 24 mmol/L (ref 20–32)
Calcium: 9.8 mg/dL (ref 8.6–10.4)
Chloride: 103 mmol/L (ref 98–110)
Creat: 1 mg/dL — ABNORMAL HIGH (ref 0.60–0.95)
Glucose, Bld: 73 mg/dL (ref 65–99)
Potassium: 4.4 mmol/L (ref 3.5–5.3)
Sodium: 138 mmol/L (ref 135–146)
eGFR: 56 mL/min/1.73m2 — ABNORMAL LOW (ref >=60)

## 2024-04-26 LAB — TSH: TSH: 1.67 m[IU]/L (ref 0.40–4.50)

## 2024-04-26 LAB — VITAMIN B12: Vitamin B-12: 863 pg/mL (ref 200–1100)

## 2024-04-29 ENCOUNTER — Ambulatory Visit: Payer: Self-pay | Admitting: Family Medicine

## 2024-05-06 ENCOUNTER — Ambulatory Visit (INDEPENDENT_AMBULATORY_CARE_PROVIDER_SITE_OTHER): Admitting: Family Medicine

## 2024-05-06 ENCOUNTER — Ambulatory Visit: Payer: Self-pay

## 2024-05-06 ENCOUNTER — Encounter: Payer: Self-pay | Admitting: Family Medicine

## 2024-05-06 VITALS — BP 110/70 | HR 70 | Temp 97.5°F | Wt 152.2 lb

## 2024-05-06 DIAGNOSIS — N390 Urinary tract infection, site not specified: Secondary | ICD-10-CM | POA: Diagnosis not present

## 2024-05-06 LAB — POC URINALSYSI DIPSTICK (AUTOMATED)
Bilirubin, UA: NEGATIVE
Blood, UA: POSITIVE
Glucose, UA: NEGATIVE
Ketones, UA: NEGATIVE
Protein, UA: NEGATIVE
Spec Grav, UA: 1.01 (ref 1.010–1.025)
Urobilinogen, UA: 1 U/dL
pH, UA: 6 (ref 5.0–8.0)

## 2024-05-06 MED ORDER — CIPROFLOXACIN HCL 500 MG PO TABS: 500.0000 mg | ORAL_TABLET | Freq: Two times a day (BID) | ORAL | 0 refills | Status: AC

## 2024-05-06 NOTE — Telephone Encounter (Signed)
 Noted

## 2024-05-06 NOTE — Telephone Encounter (Signed)
 FYI Only or Action Required?: FYI only for provider: appointment scheduled on 05/06/24.  Patient was last seen in primary care on 04/25/2024 by Johnny Garnette LABOR, MD.  Called Nurse Triage reporting Dysuria.  Symptoms began yesterday.  Interventions attempted: Rest, hydration, or home remedies.  Symptoms are: gradually worsening.  Triage Disposition: See Physician Within 24 Hours  Patient/caregiver understands and will follow disposition?: Yes Reason for Disposition  Urinating more frequently than usual (i.e., frequency) OR new-onset of the feeling of an urgent need to urinate (i.e., urgency)  Answer Assessment - Initial Assessment Questions 1. SYMPTOM: What's the main symptom you're concerned about? (e.g., frequency, incontinence)     Increased frequency  2. ONSET:      05/05/24  3. PAIN: Is there any pain?      Yes  4. CAUSE: What do you think is causing the symptoms?     UTI  5. OTHER SYMPTOMS: Do you have any other symptoms? (e.g., blood in urine, fever, flank pain, pain with urination)     Denies  Protocols used: Urinary Symptoms-A-AH Copied from CRM #[id number]. Topic: Clinical - Red Word Triage >> May 06, 2024  8:03 AM Franky GRADE wrote: Red Word that prompted transfer to Nurse Triage: Patient's husband Nelwyn is calling because the patient has developed a bad UTI that started yesterday.

## 2024-05-06 NOTE — Progress Notes (Signed)
   Subjective:    Patient ID: Dawn Harmon, female    DOB: 1940-07-21, 83 y.o.   MRN: [id number]  HPI Here for the onset yesterday of urinary urgency and frequency, as well as burning. No fever. She was treated for a UTI on 04-19-24 with Keflex , and she seemed to get over it. A urine culture showed no growth. Now she has the same symptoms again. She drinks plenty of water. She had been seeing Dr. Lamar Sar at [facility], but he has been out of the office on medical leave. She asks for a referral to see someone here in [place].    Review of Systems  Constitutional: Negative.   Respiratory: Negative.    Cardiovascular: Negative.   Gastrointestinal: Negative.   Genitourinary:  Positive for dysuria, frequency and urgency. Negative for flank pain and hematuria.       Objective:   Physical Exam Constitutional:      Appearance: Normal appearance.  Cardiovascular:     Rate and Rhythm: Normal rate and regular rhythm.     Pulses: Normal pulses.     Heart sounds: Normal heart sounds.  Pulmonary:     Effort: Pulmonary effort is normal.     Breath sounds: Normal breath sounds.  Abdominal:     Tenderness: There is no right CVA tenderness or left CVA tenderness.  Neurological:     Mental Status: She is alert.           Assessment & Plan:  Recurrent UTI. We will treat with 7 days of Cipro . Culture the sample. We will refer her to [facility].  Garnette Olmsted, MD

## 2024-05-06 NOTE — Addendum Note (Signed)
 Addended by: LADONNA INOCENTE SAILOR on: 05/06/2024 05:53 PM   Modules accepted: Orders

## 2024-05-09 ENCOUNTER — Ambulatory Visit: Payer: Self-pay | Admitting: Family Medicine

## 2024-05-09 LAB — URINE CULTURE
MICRO NUMBER:: 17188581
SPECIMEN QUALITY:: ADEQUATE

## 2024-05-27 DIAGNOSIS — H40013 Open angle with borderline findings, low risk, bilateral: Secondary | ICD-10-CM | POA: Diagnosis not present

## 2024-06-16 DIAGNOSIS — N3021 Other chronic cystitis with hematuria: Secondary | ICD-10-CM | POA: Diagnosis not present

## 2024-06-16 DIAGNOSIS — N3281 Overactive bladder: Secondary | ICD-10-CM | POA: Diagnosis not present
